# Patient Record
Sex: Female | Born: 1955 | Race: White | Hispanic: No | Smoking: Former smoker
Health system: Southern US, Community
[De-identification: ages and names within clinical notes are randomized; demographics above are authoritative.]

## PROBLEM LIST (undated history)

## (undated) DIAGNOSIS — J449 Chronic obstructive pulmonary disease, unspecified: Secondary | ICD-10-CM

## (undated) DIAGNOSIS — F419 Anxiety disorder, unspecified: Secondary | ICD-10-CM

## (undated) DIAGNOSIS — J111 Influenza due to unidentified influenza virus with other respiratory manifestations: Secondary | ICD-10-CM

## (undated) DIAGNOSIS — I10 Essential (primary) hypertension: Secondary | ICD-10-CM

## (undated) DIAGNOSIS — K219 Gastro-esophageal reflux disease without esophagitis: Secondary | ICD-10-CM

## (undated) DIAGNOSIS — F32A Depression, unspecified: Secondary | ICD-10-CM

## (undated) DIAGNOSIS — M1990 Unspecified osteoarthritis, unspecified site: Secondary | ICD-10-CM

## (undated) DIAGNOSIS — I639 Cerebral infarction, unspecified: Secondary | ICD-10-CM

## (undated) HISTORY — PX: FEMUR FRACTURE SURGERY: SHX633

## (undated) HISTORY — PX: BREAST BIOPSY: SHX20

## (undated) HISTORY — PX: ORIF TIBIA FRACTURE: SHX5416

## (undated) HISTORY — PX: CATARACT EXTRACTION W/ INTRAOCULAR LENS IMPLANT & ANTERIOR VITRECTOMY, BILATERAL: SHX1310

## (undated) HISTORY — PX: ANTERIOR CERVICAL DISCECTOMY: SHX1160

---

## 2002-04-07 ENCOUNTER — Encounter: Admission: RE | Admit: 2002-04-07 | Discharge: 2002-04-07 | Payer: Self-pay | Admitting: Neurosurgery

## 2002-04-07 ENCOUNTER — Encounter: Payer: Self-pay | Admitting: Neurosurgery

## 2002-04-20 ENCOUNTER — Encounter: Payer: Self-pay | Admitting: Neurosurgery

## 2002-04-23 ENCOUNTER — Ambulatory Visit (HOSPITAL_COMMUNITY): Admission: RE | Admit: 2002-04-23 | Discharge: 2002-04-24 | Payer: Self-pay | Admitting: Neurosurgery

## 2002-04-23 ENCOUNTER — Encounter: Payer: Self-pay | Admitting: Neurosurgery

## 2002-05-29 ENCOUNTER — Encounter: Payer: Self-pay | Admitting: Neurosurgery

## 2002-05-29 ENCOUNTER — Encounter: Admission: RE | Admit: 2002-05-29 | Discharge: 2002-05-29 | Payer: Self-pay | Admitting: Neurosurgery

## 2002-06-04 ENCOUNTER — Other Ambulatory Visit: Admission: RE | Admit: 2002-06-04 | Discharge: 2002-06-04 | Payer: Self-pay | Admitting: Dermatology

## 2002-07-01 ENCOUNTER — Encounter: Admission: RE | Admit: 2002-07-01 | Discharge: 2002-07-01 | Payer: Self-pay | Admitting: Neurosurgery

## 2002-07-01 ENCOUNTER — Encounter: Payer: Self-pay | Admitting: Neurosurgery

## 2002-08-18 ENCOUNTER — Ambulatory Visit (HOSPITAL_COMMUNITY): Admission: RE | Admit: 2002-08-18 | Discharge: 2002-08-18 | Payer: Self-pay | Admitting: Neurosurgery

## 2002-08-18 ENCOUNTER — Encounter: Payer: Self-pay | Admitting: Neurosurgery

## 2002-09-29 ENCOUNTER — Encounter: Admission: RE | Admit: 2002-09-29 | Discharge: 2002-09-29 | Payer: Self-pay | Admitting: Neurosurgery

## 2002-09-29 ENCOUNTER — Encounter: Payer: Self-pay | Admitting: Neurosurgery

## 2010-01-10 ENCOUNTER — Encounter: Admission: RE | Admit: 2010-01-10 | Discharge: 2010-01-10 | Payer: Self-pay | Admitting: Anesthesiology

## 2011-01-15 ENCOUNTER — Inpatient Hospital Stay: Admission: RE | Admit: 2011-01-15 | Payer: Self-pay | Source: Home / Self Care | Admitting: Neurosurgery

## 2011-05-11 NOTE — Op Note (Signed)
[place]  Patient:    Judith Hunt, Judith Hunt Visit Number: [id number] MRN: [id number]          Service Type: DSU Location: [id number] Attending Physician:  Mariam Dollar Dictated by:   Garlon Hatchet., M.D. Proc. 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  PROCEDURE:  Anterior cervical diskectomies and fusion at C5-6 and C6-7 with a 6 mm patellar wedge at C5-6 and a 7 mm patellar wedge at C6-7, a 37.5 mm Atlantis plate with six 13 mm variable-angled screws, with one rescue screw.  SURGEON:  Garlon Hatchet., M.D.  ASSISTANT:  Reinaldo Meeker, M.D.  ANESTHESIA:  General endotracheal.  CLINICAL NOTE:  The patient is a very pleasant 55 year old female who has had long-standing neck and left arm pain radiating down to her thumb and first two fingers.  This has been going on for several months and got progressively worse.  The patient failed conservative treatment of anti-inflammatories and physical therapy.  She had continued pain with weakness in her biceps and triceps at about 4+/5 with decreased sensation in what appeared to be a C6 and C7 distribution.  Her MRI showed spondylitic disease at C5-6 compressing the proximal aspect of the left C6 nerve root as well as at C6-7 compressing the left C7 nerve root.  She failed conservative treatment and was recommended diskectomies and fusion, extensively counseled of the risks and benefits of surgery, and agreed to proceed forward.  DESCRIPTION OF PROCEDURE:  The patient was brought into the OR, was induced under general anesthesia, positioned supine with the neck in slight extension and five pounds of Holter traction.  The right side of the neck was prepped and draped in the usual sterile fashion.  Preoperative x-ray  localized a needle over the C6 vertebral body.  A curvilinear incision made just off the anterior midline to the anterior border of the sternocleidomastoid with a 10 blade scalpel.  The superficial layer of the platysma was dissected out and divided longitudinally.  The avascular plane between the sternocleidomastoid and the omohyoid and strap muscles was developed down to prevertebral fascia. Prevertebral fascia was dissected away with Kitners.  The longus coli was reflected laterally, and the self-retaining retractor was placed. Intraoperative x-ray confirmed localization of a needle in the C5-6 disk space. Using the 11 blade scalpel, annulotomy was made on the left at C5-6 and C6-7. Pituitary rongeurs were used to remove the anterior margin of the annulus.  A high-speed drill was used to drill down the remainder of the annulus and nucleus pulposus down to the posterior annulus at both levels. Then the operating microscope was draped, brought into the field, and under microscopic illumination first at the C5-6 disk space, the remainder of the end plates were drilled down to the posterior osteophyte.  Then using a 1 and 2 mm Kerrison punch the osteophytes were underbitten and the posterior longitudinal ligament was identified and removed in piecemeal fashion.  There was a large amount of uncinate hypertrophy compressing the left C6 nerve root.  This was all removed in a piecemeal fashion with 1 and 2 mm Kerrison punch, exposing the proximal aspect of the C6 nerve root.  This  was radically decompressed out its foramen and felt with an angled nerve hook and noted to be widely decompressed.  The thecal sac was then decompressed down to the right side.  The proximal aspect of the right-sided C6 nerve root was decompressed.  Gelfoam was overlaid.  After the end plates had been prepared for arthrodesis, attention was taken to the C6-7 disk space.  Again the high-speed drill was used to drill  down the remainder of the nucleus pulposus, posterior annulus, and down the posterior longitudinal ligament.  There was a very large osteophyte coming off of the C6 vertebral body compressing the proximal aspect of the C7 nerve root.  This was all underbitten with a 1 and 2 mm Kerrison punch, and the proximal aspect of the C7 nerve root was widely decompressed and explored with an angled nerve hook, where it was noted to have no further compression.  The thecal sac was then decompressed and the proximal aspect of the right C7 nerve root was also decompressed.  After both C7 neural foramina were re-explored with angled nerve root and noted to have no further compression, the end plates were then prepared for arthrodesis. Using an interbody spreader, a 6 mm Tutagen patellar wedge was inserted in C5-6 after 2 mm were cut off the depth, and the 7 mm Tutagen patellar wedge was inserted at C6-7 in the routine fashion.  Then a 37 mm Atlantis plate was sized, selected, and inserted.  Six 13 mm variable-angled screws were drilled, tapped, and inserted.  The left-sided screw in the body of C5 was loose.  This was taken out, and a rescue screw was replaced.  All screws had excellent purchase.  Locking nuts were tightened down.  The wound was copiously irrigated and again meticulous hemostasis was maintained.  Postop x-ray confirmed good placement of the bone graft, screws, and hardware.  The platysma was reapproximated with 3-0 interrupted Vicryl, the skin was closed with running 4-0 subcuticular, and benzoin and Steri-Strips were applied.  The patient went to the recovery room in stable condition.  At the end of the case, all needle counts and sponge counts were correct. Dictated by:   Garlon Hatchet., M.D. Attending Physician:  Mariam Dollar DD:  04/23/02 TD:  04/25/02 Job: [id number]

## 2013-01-20 DIAGNOSIS — R079 Chest pain, unspecified: Secondary | ICD-10-CM

## 2015-08-02 DIAGNOSIS — G894 Chronic pain syndrome: Secondary | ICD-10-CM | POA: Diagnosis not present

## 2015-08-02 DIAGNOSIS — Z79891 Long term (current) use of opiate analgesic: Secondary | ICD-10-CM | POA: Diagnosis not present

## 2015-08-02 DIAGNOSIS — M79605 Pain in left leg: Secondary | ICD-10-CM | POA: Diagnosis not present

## 2015-08-02 DIAGNOSIS — M961 Postlaminectomy syndrome, not elsewhere classified: Secondary | ICD-10-CM | POA: Diagnosis not present

## 2015-08-30 DIAGNOSIS — G894 Chronic pain syndrome: Secondary | ICD-10-CM | POA: Diagnosis not present

## 2015-08-30 DIAGNOSIS — M961 Postlaminectomy syndrome, not elsewhere classified: Secondary | ICD-10-CM | POA: Diagnosis not present

## 2015-08-30 DIAGNOSIS — Z79891 Long term (current) use of opiate analgesic: Secondary | ICD-10-CM | POA: Diagnosis not present

## 2015-08-30 DIAGNOSIS — M79605 Pain in left leg: Secondary | ICD-10-CM | POA: Diagnosis not present

## 2015-09-27 DIAGNOSIS — Z79891 Long term (current) use of opiate analgesic: Secondary | ICD-10-CM | POA: Diagnosis not present

## 2015-09-27 DIAGNOSIS — M79605 Pain in left leg: Secondary | ICD-10-CM | POA: Diagnosis not present

## 2015-09-27 DIAGNOSIS — G894 Chronic pain syndrome: Secondary | ICD-10-CM | POA: Diagnosis not present

## 2015-09-27 DIAGNOSIS — M961 Postlaminectomy syndrome, not elsewhere classified: Secondary | ICD-10-CM | POA: Diagnosis not present

## 2015-10-12 DIAGNOSIS — M810 Age-related osteoporosis without current pathological fracture: Secondary | ICD-10-CM | POA: Diagnosis not present

## 2015-10-14 DIAGNOSIS — M4316 Spondylolisthesis, lumbar region: Secondary | ICD-10-CM | POA: Diagnosis not present

## 2015-10-14 DIAGNOSIS — Z9889 Other specified postprocedural states: Secondary | ICD-10-CM | POA: Diagnosis not present

## 2015-10-14 DIAGNOSIS — M5137 Other intervertebral disc degeneration, lumbosacral region: Secondary | ICD-10-CM | POA: Diagnosis not present

## 2015-10-14 DIAGNOSIS — Z0389 Encounter for observation for other suspected diseases and conditions ruled out: Secondary | ICD-10-CM | POA: Diagnosis not present

## 2015-10-14 DIAGNOSIS — M5136 Other intervertebral disc degeneration, lumbar region: Secondary | ICD-10-CM | POA: Diagnosis not present

## 2015-10-14 DIAGNOSIS — M810 Age-related osteoporosis without current pathological fracture: Secondary | ICD-10-CM | POA: Diagnosis not present

## 2015-10-14 DIAGNOSIS — Z23 Encounter for immunization: Secondary | ICD-10-CM | POA: Diagnosis not present

## 2015-10-26 DIAGNOSIS — Z79891 Long term (current) use of opiate analgesic: Secondary | ICD-10-CM | POA: Diagnosis not present

## 2015-10-26 DIAGNOSIS — M961 Postlaminectomy syndrome, not elsewhere classified: Secondary | ICD-10-CM | POA: Diagnosis not present

## 2015-10-26 DIAGNOSIS — G894 Chronic pain syndrome: Secondary | ICD-10-CM | POA: Diagnosis not present

## 2015-10-26 DIAGNOSIS — M79605 Pain in left leg: Secondary | ICD-10-CM | POA: Diagnosis not present

## 2015-11-02 DIAGNOSIS — M25532 Pain in left wrist: Secondary | ICD-10-CM | POA: Diagnosis not present

## 2015-11-07 DIAGNOSIS — F331 Major depressive disorder, recurrent, moderate: Secondary | ICD-10-CM | POA: Diagnosis not present

## 2015-11-07 DIAGNOSIS — M810 Age-related osteoporosis without current pathological fracture: Secondary | ICD-10-CM | POA: Diagnosis not present

## 2015-11-07 DIAGNOSIS — F411 Generalized anxiety disorder: Secondary | ICD-10-CM | POA: Diagnosis not present

## 2015-11-07 DIAGNOSIS — I10 Essential (primary) hypertension: Secondary | ICD-10-CM | POA: Diagnosis not present

## 2015-11-07 DIAGNOSIS — M170 Bilateral primary osteoarthritis of knee: Secondary | ICD-10-CM | POA: Diagnosis not present

## 2015-11-07 DIAGNOSIS — E782 Mixed hyperlipidemia: Secondary | ICD-10-CM | POA: Diagnosis not present

## 2015-11-07 DIAGNOSIS — J301 Allergic rhinitis due to pollen: Secondary | ICD-10-CM | POA: Diagnosis not present

## 2015-11-07 DIAGNOSIS — K219 Gastro-esophageal reflux disease without esophagitis: Secondary | ICD-10-CM | POA: Diagnosis not present

## 2015-11-23 DIAGNOSIS — M79605 Pain in left leg: Secondary | ICD-10-CM | POA: Diagnosis not present

## 2015-11-23 DIAGNOSIS — Z79891 Long term (current) use of opiate analgesic: Secondary | ICD-10-CM | POA: Diagnosis not present

## 2015-11-23 DIAGNOSIS — M961 Postlaminectomy syndrome, not elsewhere classified: Secondary | ICD-10-CM | POA: Diagnosis not present

## 2015-11-23 DIAGNOSIS — G894 Chronic pain syndrome: Secondary | ICD-10-CM | POA: Diagnosis not present

## 2016-01-30 DIAGNOSIS — Z79891 Long term (current) use of opiate analgesic: Secondary | ICD-10-CM | POA: Diagnosis not present

## 2016-01-30 DIAGNOSIS — M961 Postlaminectomy syndrome, not elsewhere classified: Secondary | ICD-10-CM | POA: Diagnosis not present

## 2016-01-30 DIAGNOSIS — M79605 Pain in left leg: Secondary | ICD-10-CM | POA: Diagnosis not present

## 2016-01-30 DIAGNOSIS — G894 Chronic pain syndrome: Secondary | ICD-10-CM | POA: Diagnosis not present

## 2016-02-01 DIAGNOSIS — Z8781 Personal history of (healed) traumatic fracture: Secondary | ICD-10-CM | POA: Diagnosis not present

## 2016-02-01 DIAGNOSIS — G8929 Other chronic pain: Secondary | ICD-10-CM | POA: Diagnosis not present

## 2016-02-01 DIAGNOSIS — M25562 Pain in left knee: Secondary | ICD-10-CM | POA: Diagnosis not present

## 2016-02-27 DIAGNOSIS — M79605 Pain in left leg: Secondary | ICD-10-CM | POA: Diagnosis not present

## 2016-02-27 DIAGNOSIS — G894 Chronic pain syndrome: Secondary | ICD-10-CM | POA: Diagnosis not present

## 2016-02-27 DIAGNOSIS — Z79891 Long term (current) use of opiate analgesic: Secondary | ICD-10-CM | POA: Diagnosis not present

## 2016-02-27 DIAGNOSIS — M961 Postlaminectomy syndrome, not elsewhere classified: Secondary | ICD-10-CM | POA: Diagnosis not present

## 2016-03-06 DIAGNOSIS — S82202K Unspecified fracture of shaft of left tibia, subsequent encounter for closed fracture with nonunion: Secondary | ICD-10-CM | POA: Diagnosis not present

## 2016-03-27 DIAGNOSIS — Z79891 Long term (current) use of opiate analgesic: Secondary | ICD-10-CM | POA: Diagnosis not present

## 2016-03-27 DIAGNOSIS — M961 Postlaminectomy syndrome, not elsewhere classified: Secondary | ICD-10-CM | POA: Diagnosis not present

## 2016-03-27 DIAGNOSIS — M79605 Pain in left leg: Secondary | ICD-10-CM | POA: Diagnosis not present

## 2016-03-27 DIAGNOSIS — G894 Chronic pain syndrome: Secondary | ICD-10-CM | POA: Diagnosis not present

## 2016-04-10 DIAGNOSIS — M810 Age-related osteoporosis without current pathological fracture: Secondary | ICD-10-CM | POA: Diagnosis not present

## 2016-04-18 DIAGNOSIS — Z9889 Other specified postprocedural states: Secondary | ICD-10-CM | POA: Diagnosis not present

## 2016-04-18 DIAGNOSIS — M79662 Pain in left lower leg: Secondary | ICD-10-CM | POA: Diagnosis not present

## 2016-04-18 DIAGNOSIS — M170 Bilateral primary osteoarthritis of knee: Secondary | ICD-10-CM | POA: Diagnosis not present

## 2016-04-25 DIAGNOSIS — K219 Gastro-esophageal reflux disease without esophagitis: Secondary | ICD-10-CM | POA: Diagnosis not present

## 2016-04-25 DIAGNOSIS — Z79891 Long term (current) use of opiate analgesic: Secondary | ICD-10-CM | POA: Diagnosis not present

## 2016-04-25 DIAGNOSIS — I10 Essential (primary) hypertension: Secondary | ICD-10-CM | POA: Diagnosis not present

## 2016-04-25 DIAGNOSIS — G894 Chronic pain syndrome: Secondary | ICD-10-CM | POA: Diagnosis not present

## 2016-04-25 DIAGNOSIS — M961 Postlaminectomy syndrome, not elsewhere classified: Secondary | ICD-10-CM | POA: Diagnosis not present

## 2016-04-25 DIAGNOSIS — E782 Mixed hyperlipidemia: Secondary | ICD-10-CM | POA: Diagnosis not present

## 2016-04-25 DIAGNOSIS — M79605 Pain in left leg: Secondary | ICD-10-CM | POA: Diagnosis not present

## 2016-05-02 DIAGNOSIS — F411 Generalized anxiety disorder: Secondary | ICD-10-CM | POA: Diagnosis not present

## 2016-05-02 DIAGNOSIS — F17210 Nicotine dependence, cigarettes, uncomplicated: Secondary | ICD-10-CM | POA: Diagnosis not present

## 2016-05-02 DIAGNOSIS — F331 Major depressive disorder, recurrent, moderate: Secondary | ICD-10-CM | POA: Diagnosis not present

## 2016-05-02 DIAGNOSIS — J301 Allergic rhinitis due to pollen: Secondary | ICD-10-CM | POA: Diagnosis not present

## 2016-05-02 DIAGNOSIS — E782 Mixed hyperlipidemia: Secondary | ICD-10-CM | POA: Diagnosis not present

## 2016-05-02 DIAGNOSIS — M170 Bilateral primary osteoarthritis of knee: Secondary | ICD-10-CM | POA: Diagnosis not present

## 2016-05-02 DIAGNOSIS — Z1389 Encounter for screening for other disorder: Secondary | ICD-10-CM | POA: Diagnosis not present

## 2016-05-02 DIAGNOSIS — I10 Essential (primary) hypertension: Secondary | ICD-10-CM | POA: Diagnosis not present

## 2016-05-04 DIAGNOSIS — F17200 Nicotine dependence, unspecified, uncomplicated: Secondary | ICD-10-CM | POA: Diagnosis not present

## 2016-05-04 DIAGNOSIS — J449 Chronic obstructive pulmonary disease, unspecified: Secondary | ICD-10-CM | POA: Diagnosis not present

## 2016-05-04 DIAGNOSIS — M25562 Pain in left knee: Secondary | ICD-10-CM | POA: Diagnosis not present

## 2016-05-04 DIAGNOSIS — T84117A Breakdown (mechanical) of internal fixation device of bone of left lower leg, initial encounter: Secondary | ICD-10-CM | POA: Diagnosis not present

## 2016-05-04 DIAGNOSIS — M179 Osteoarthritis of knee, unspecified: Secondary | ICD-10-CM | POA: Diagnosis not present

## 2016-05-04 DIAGNOSIS — I10 Essential (primary) hypertension: Secondary | ICD-10-CM | POA: Diagnosis not present

## 2016-05-04 DIAGNOSIS — S72415A Nondisplaced unspecified condyle fracture of lower end of left femur, initial encounter for closed fracture: Secondary | ICD-10-CM | POA: Diagnosis not present

## 2016-05-04 DIAGNOSIS — M8580 Other specified disorders of bone density and structure, unspecified site: Secondary | ICD-10-CM | POA: Diagnosis not present

## 2016-05-04 DIAGNOSIS — T84218A Breakdown (mechanical) of internal fixation device of other bones, initial encounter: Secondary | ICD-10-CM | POA: Diagnosis not present

## 2016-05-04 DIAGNOSIS — M79605 Pain in left leg: Secondary | ICD-10-CM | POA: Diagnosis not present

## 2016-05-04 DIAGNOSIS — Z79899 Other long term (current) drug therapy: Secondary | ICD-10-CM | POA: Diagnosis not present

## 2016-05-04 DIAGNOSIS — G500 Trigeminal neuralgia: Secondary | ICD-10-CM | POA: Diagnosis not present

## 2016-05-14 DIAGNOSIS — S72435A Nondisplaced fracture of medial condyle of left femur, initial encounter for closed fracture: Secondary | ICD-10-CM | POA: Diagnosis not present

## 2016-05-29 DIAGNOSIS — S72435A Nondisplaced fracture of medial condyle of left femur, initial encounter for closed fracture: Secondary | ICD-10-CM | POA: Diagnosis not present

## 2016-06-20 DIAGNOSIS — M79605 Pain in left leg: Secondary | ICD-10-CM | POA: Diagnosis not present

## 2016-06-20 DIAGNOSIS — M961 Postlaminectomy syndrome, not elsewhere classified: Secondary | ICD-10-CM | POA: Diagnosis not present

## 2016-06-20 DIAGNOSIS — G894 Chronic pain syndrome: Secondary | ICD-10-CM | POA: Diagnosis not present

## 2016-06-20 DIAGNOSIS — Z79891 Long term (current) use of opiate analgesic: Secondary | ICD-10-CM | POA: Diagnosis not present

## 2016-06-27 DIAGNOSIS — S72435A Nondisplaced fracture of medial condyle of left femur, initial encounter for closed fracture: Secondary | ICD-10-CM | POA: Diagnosis not present

## 2016-06-30 DIAGNOSIS — J029 Acute pharyngitis, unspecified: Secondary | ICD-10-CM | POA: Diagnosis not present

## 2016-06-30 DIAGNOSIS — K219 Gastro-esophageal reflux disease without esophagitis: Secondary | ICD-10-CM | POA: Diagnosis not present

## 2016-06-30 DIAGNOSIS — J309 Allergic rhinitis, unspecified: Secondary | ICD-10-CM | POA: Diagnosis not present

## 2016-07-05 DIAGNOSIS — M79651 Pain in right thigh: Secondary | ICD-10-CM | POA: Diagnosis not present

## 2016-07-05 DIAGNOSIS — T8189XA Other complications of procedures, not elsewhere classified, initial encounter: Secondary | ICD-10-CM | POA: Diagnosis not present

## 2016-07-05 DIAGNOSIS — M25561 Pain in right knee: Secondary | ICD-10-CM | POA: Diagnosis not present

## 2016-07-05 DIAGNOSIS — J449 Chronic obstructive pulmonary disease, unspecified: Secondary | ICD-10-CM | POA: Diagnosis not present

## 2016-07-05 DIAGNOSIS — I10 Essential (primary) hypertension: Secondary | ICD-10-CM | POA: Diagnosis not present

## 2016-07-05 DIAGNOSIS — Z79899 Other long term (current) drug therapy: Secondary | ICD-10-CM | POA: Diagnosis not present

## 2016-07-05 DIAGNOSIS — M25562 Pain in left knee: Secondary | ICD-10-CM | POA: Diagnosis not present

## 2016-07-05 DIAGNOSIS — F17200 Nicotine dependence, unspecified, uncomplicated: Secondary | ICD-10-CM | POA: Diagnosis not present

## 2016-07-05 DIAGNOSIS — M25461 Effusion, right knee: Secondary | ICD-10-CM | POA: Diagnosis not present

## 2016-07-09 DIAGNOSIS — S82034A Nondisplaced transverse fracture of right patella, initial encounter for closed fracture: Secondary | ICD-10-CM | POA: Diagnosis not present

## 2016-07-09 DIAGNOSIS — S72435A Nondisplaced fracture of medial condyle of left femur, initial encounter for closed fracture: Secondary | ICD-10-CM | POA: Diagnosis not present

## 2016-07-23 DIAGNOSIS — S82034A Nondisplaced transverse fracture of right patella, initial encounter for closed fracture: Secondary | ICD-10-CM | POA: Diagnosis not present

## 2016-08-02 DIAGNOSIS — S82034A Nondisplaced transverse fracture of right patella, initial encounter for closed fracture: Secondary | ICD-10-CM | POA: Diagnosis not present

## 2016-08-02 DIAGNOSIS — M25562 Pain in left knee: Secondary | ICD-10-CM | POA: Diagnosis not present

## 2016-08-15 DIAGNOSIS — M79605 Pain in left leg: Secondary | ICD-10-CM | POA: Diagnosis not present

## 2016-08-15 DIAGNOSIS — M961 Postlaminectomy syndrome, not elsewhere classified: Secondary | ICD-10-CM | POA: Diagnosis not present

## 2016-08-15 DIAGNOSIS — G894 Chronic pain syndrome: Secondary | ICD-10-CM | POA: Diagnosis not present

## 2016-08-15 DIAGNOSIS — Z79891 Long term (current) use of opiate analgesic: Secondary | ICD-10-CM | POA: Diagnosis not present

## 2016-08-30 DIAGNOSIS — E782 Mixed hyperlipidemia: Secondary | ICD-10-CM | POA: Diagnosis not present

## 2016-08-30 DIAGNOSIS — K210 Gastro-esophageal reflux disease with esophagitis: Secondary | ICD-10-CM | POA: Diagnosis not present

## 2016-08-30 DIAGNOSIS — I10 Essential (primary) hypertension: Secondary | ICD-10-CM | POA: Diagnosis not present

## 2016-08-30 DIAGNOSIS — F411 Generalized anxiety disorder: Secondary | ICD-10-CM | POA: Diagnosis not present

## 2016-09-03 DIAGNOSIS — M7052 Other bursitis of knee, left knee: Secondary | ICD-10-CM | POA: Diagnosis not present

## 2016-09-03 DIAGNOSIS — M25562 Pain in left knee: Secondary | ICD-10-CM | POA: Diagnosis not present

## 2016-09-03 DIAGNOSIS — T8484XA Pain due to internal orthopedic prosthetic devices, implants and grafts, initial encounter: Secondary | ICD-10-CM | POA: Diagnosis not present

## 2016-09-05 DIAGNOSIS — E782 Mixed hyperlipidemia: Secondary | ICD-10-CM | POA: Diagnosis not present

## 2016-09-05 DIAGNOSIS — I10 Essential (primary) hypertension: Secondary | ICD-10-CM | POA: Diagnosis not present

## 2016-09-05 DIAGNOSIS — Z6827 Body mass index (BMI) 27.0-27.9, adult: Secondary | ICD-10-CM | POA: Diagnosis not present

## 2016-09-05 DIAGNOSIS — Z23 Encounter for immunization: Secondary | ICD-10-CM | POA: Diagnosis not present

## 2016-09-05 DIAGNOSIS — F331 Major depressive disorder, recurrent, moderate: Secondary | ICD-10-CM | POA: Diagnosis not present

## 2016-09-05 DIAGNOSIS — F17210 Nicotine dependence, cigarettes, uncomplicated: Secondary | ICD-10-CM | POA: Diagnosis not present

## 2016-09-05 DIAGNOSIS — J301 Allergic rhinitis due to pollen: Secondary | ICD-10-CM | POA: Diagnosis not present

## 2016-09-05 DIAGNOSIS — F411 Generalized anxiety disorder: Secondary | ICD-10-CM | POA: Diagnosis not present

## 2016-09-12 DIAGNOSIS — M961 Postlaminectomy syndrome, not elsewhere classified: Secondary | ICD-10-CM | POA: Diagnosis not present

## 2016-09-12 DIAGNOSIS — G894 Chronic pain syndrome: Secondary | ICD-10-CM | POA: Diagnosis not present

## 2016-09-12 DIAGNOSIS — M79605 Pain in left leg: Secondary | ICD-10-CM | POA: Diagnosis not present

## 2016-09-12 DIAGNOSIS — Z79891 Long term (current) use of opiate analgesic: Secondary | ICD-10-CM | POA: Diagnosis not present

## 2016-10-16 DIAGNOSIS — Z79891 Long term (current) use of opiate analgesic: Secondary | ICD-10-CM | POA: Diagnosis not present

## 2016-10-16 DIAGNOSIS — G894 Chronic pain syndrome: Secondary | ICD-10-CM | POA: Diagnosis not present

## 2016-10-16 DIAGNOSIS — M961 Postlaminectomy syndrome, not elsewhere classified: Secondary | ICD-10-CM | POA: Diagnosis not present

## 2016-10-16 DIAGNOSIS — M79605 Pain in left leg: Secondary | ICD-10-CM | POA: Diagnosis not present

## 2016-10-17 DIAGNOSIS — T8484XA Pain due to internal orthopedic prosthetic devices, implants and grafts, initial encounter: Secondary | ICD-10-CM | POA: Diagnosis not present

## 2016-10-17 DIAGNOSIS — M25562 Pain in left knee: Secondary | ICD-10-CM | POA: Diagnosis not present

## 2016-10-26 DIAGNOSIS — K219 Gastro-esophageal reflux disease without esophagitis: Secondary | ICD-10-CM | POA: Diagnosis not present

## 2016-10-26 DIAGNOSIS — Z880 Allergy status to penicillin: Secondary | ICD-10-CM | POA: Diagnosis not present

## 2016-10-26 DIAGNOSIS — X58XXXD Exposure to other specified factors, subsequent encounter: Secondary | ICD-10-CM | POA: Diagnosis not present

## 2016-10-26 DIAGNOSIS — Z888 Allergy status to other drugs, medicaments and biological substances status: Secondary | ICD-10-CM | POA: Diagnosis not present

## 2016-10-26 DIAGNOSIS — T8484XA Pain due to internal orthopedic prosthetic devices, implants and grafts, initial encounter: Secondary | ICD-10-CM | POA: Diagnosis not present

## 2016-10-26 DIAGNOSIS — I10 Essential (primary) hypertension: Secondary | ICD-10-CM | POA: Diagnosis not present

## 2016-10-26 DIAGNOSIS — J449 Chronic obstructive pulmonary disease, unspecified: Secondary | ICD-10-CM | POA: Diagnosis not present

## 2016-10-26 DIAGNOSIS — G8918 Other acute postprocedural pain: Secondary | ICD-10-CM | POA: Diagnosis not present

## 2016-10-26 DIAGNOSIS — Z9049 Acquired absence of other specified parts of digestive tract: Secondary | ICD-10-CM | POA: Diagnosis not present

## 2016-10-26 DIAGNOSIS — Y838 Other surgical procedures as the cause of abnormal reaction of the patient, or of later complication, without mention of misadventure at the time of the procedure: Secondary | ICD-10-CM | POA: Diagnosis not present

## 2016-10-26 DIAGNOSIS — F419 Anxiety disorder, unspecified: Secondary | ICD-10-CM | POA: Diagnosis not present

## 2016-10-26 DIAGNOSIS — Z8249 Family history of ischemic heart disease and other diseases of the circulatory system: Secondary | ICD-10-CM | POA: Diagnosis not present

## 2016-10-26 DIAGNOSIS — Z8261 Family history of arthritis: Secondary | ICD-10-CM | POA: Diagnosis not present

## 2016-10-26 DIAGNOSIS — Z79899 Other long term (current) drug therapy: Secondary | ICD-10-CM | POA: Diagnosis not present

## 2016-10-26 DIAGNOSIS — M79662 Pain in left lower leg: Secondary | ICD-10-CM | POA: Diagnosis not present

## 2016-10-26 DIAGNOSIS — F329 Major depressive disorder, single episode, unspecified: Secondary | ICD-10-CM | POA: Diagnosis not present

## 2016-10-26 DIAGNOSIS — S82202P Unspecified fracture of shaft of left tibia, subsequent encounter for closed fracture with malunion: Secondary | ICD-10-CM | POA: Diagnosis not present

## 2016-10-26 DIAGNOSIS — F17210 Nicotine dependence, cigarettes, uncomplicated: Secondary | ICD-10-CM | POA: Diagnosis not present

## 2016-10-26 DIAGNOSIS — Z91040 Latex allergy status: Secondary | ICD-10-CM | POA: Diagnosis not present

## 2016-11-06 DIAGNOSIS — M25562 Pain in left knee: Secondary | ICD-10-CM | POA: Diagnosis not present

## 2016-11-14 DIAGNOSIS — Z79891 Long term (current) use of opiate analgesic: Secondary | ICD-10-CM | POA: Diagnosis not present

## 2016-11-14 DIAGNOSIS — M79605 Pain in left leg: Secondary | ICD-10-CM | POA: Diagnosis not present

## 2016-11-14 DIAGNOSIS — G894 Chronic pain syndrome: Secondary | ICD-10-CM | POA: Diagnosis not present

## 2016-11-14 DIAGNOSIS — M961 Postlaminectomy syndrome, not elsewhere classified: Secondary | ICD-10-CM | POA: Diagnosis not present

## 2017-01-04 DIAGNOSIS — F411 Generalized anxiety disorder: Secondary | ICD-10-CM | POA: Diagnosis not present

## 2017-01-04 DIAGNOSIS — K210 Gastro-esophageal reflux disease with esophagitis: Secondary | ICD-10-CM | POA: Diagnosis not present

## 2017-01-04 DIAGNOSIS — J449 Chronic obstructive pulmonary disease, unspecified: Secondary | ICD-10-CM | POA: Diagnosis not present

## 2017-01-04 DIAGNOSIS — F331 Major depressive disorder, recurrent, moderate: Secondary | ICD-10-CM | POA: Diagnosis not present

## 2017-01-04 DIAGNOSIS — F17210 Nicotine dependence, cigarettes, uncomplicated: Secondary | ICD-10-CM | POA: Diagnosis not present

## 2017-01-04 DIAGNOSIS — Z9189 Other specified personal risk factors, not elsewhere classified: Secondary | ICD-10-CM | POA: Diagnosis not present

## 2017-01-04 DIAGNOSIS — I10 Essential (primary) hypertension: Secondary | ICD-10-CM | POA: Diagnosis not present

## 2017-01-04 DIAGNOSIS — E782 Mixed hyperlipidemia: Secondary | ICD-10-CM | POA: Diagnosis not present

## 2017-01-08 DIAGNOSIS — J301 Allergic rhinitis due to pollen: Secondary | ICD-10-CM | POA: Diagnosis not present

## 2017-01-08 DIAGNOSIS — I10 Essential (primary) hypertension: Secondary | ICD-10-CM | POA: Diagnosis not present

## 2017-01-08 DIAGNOSIS — E782 Mixed hyperlipidemia: Secondary | ICD-10-CM | POA: Diagnosis not present

## 2017-01-15 DIAGNOSIS — Z1231 Encounter for screening mammogram for malignant neoplasm of breast: Secondary | ICD-10-CM | POA: Diagnosis not present

## 2017-01-31 DIAGNOSIS — G894 Chronic pain syndrome: Secondary | ICD-10-CM | POA: Diagnosis not present

## 2017-01-31 DIAGNOSIS — Z79891 Long term (current) use of opiate analgesic: Secondary | ICD-10-CM | POA: Diagnosis not present

## 2017-01-31 DIAGNOSIS — M47816 Spondylosis without myelopathy or radiculopathy, lumbar region: Secondary | ICD-10-CM | POA: Diagnosis not present

## 2017-01-31 DIAGNOSIS — M79605 Pain in left leg: Secondary | ICD-10-CM | POA: Diagnosis not present

## 2017-01-31 DIAGNOSIS — M961 Postlaminectomy syndrome, not elsewhere classified: Secondary | ICD-10-CM | POA: Diagnosis not present

## 2017-02-05 DIAGNOSIS — Z1212 Encounter for screening for malignant neoplasm of rectum: Secondary | ICD-10-CM | POA: Diagnosis not present

## 2017-02-05 DIAGNOSIS — Z1211 Encounter for screening for malignant neoplasm of colon: Secondary | ICD-10-CM | POA: Diagnosis not present

## 2017-02-28 DIAGNOSIS — Z79891 Long term (current) use of opiate analgesic: Secondary | ICD-10-CM | POA: Diagnosis not present

## 2017-02-28 DIAGNOSIS — M961 Postlaminectomy syndrome, not elsewhere classified: Secondary | ICD-10-CM | POA: Diagnosis not present

## 2017-02-28 DIAGNOSIS — G894 Chronic pain syndrome: Secondary | ICD-10-CM | POA: Diagnosis not present

## 2017-02-28 DIAGNOSIS — M79605 Pain in left leg: Secondary | ICD-10-CM | POA: Diagnosis not present

## 2017-03-28 DIAGNOSIS — Z79891 Long term (current) use of opiate analgesic: Secondary | ICD-10-CM | POA: Diagnosis not present

## 2017-03-28 DIAGNOSIS — M961 Postlaminectomy syndrome, not elsewhere classified: Secondary | ICD-10-CM | POA: Diagnosis not present

## 2017-03-28 DIAGNOSIS — G894 Chronic pain syndrome: Secondary | ICD-10-CM | POA: Diagnosis not present

## 2017-03-28 DIAGNOSIS — M79605 Pain in left leg: Secondary | ICD-10-CM | POA: Diagnosis not present

## 2017-04-26 DIAGNOSIS — G894 Chronic pain syndrome: Secondary | ICD-10-CM | POA: Diagnosis not present

## 2017-04-26 DIAGNOSIS — M79605 Pain in left leg: Secondary | ICD-10-CM | POA: Diagnosis not present

## 2017-04-26 DIAGNOSIS — M961 Postlaminectomy syndrome, not elsewhere classified: Secondary | ICD-10-CM | POA: Diagnosis not present

## 2017-04-26 DIAGNOSIS — Z79891 Long term (current) use of opiate analgesic: Secondary | ICD-10-CM | POA: Diagnosis not present

## 2017-05-08 DIAGNOSIS — I10 Essential (primary) hypertension: Secondary | ICD-10-CM | POA: Diagnosis not present

## 2017-05-08 DIAGNOSIS — E782 Mixed hyperlipidemia: Secondary | ICD-10-CM | POA: Diagnosis not present

## 2017-05-08 DIAGNOSIS — J449 Chronic obstructive pulmonary disease, unspecified: Secondary | ICD-10-CM | POA: Diagnosis not present

## 2017-05-10 DIAGNOSIS — E782 Mixed hyperlipidemia: Secondary | ICD-10-CM | POA: Diagnosis not present

## 2017-05-10 DIAGNOSIS — M170 Bilateral primary osteoarthritis of knee: Secondary | ICD-10-CM | POA: Diagnosis not present

## 2017-05-10 DIAGNOSIS — Z23 Encounter for immunization: Secondary | ICD-10-CM | POA: Diagnosis not present

## 2017-05-10 DIAGNOSIS — Z1389 Encounter for screening for other disorder: Secondary | ICD-10-CM | POA: Diagnosis not present

## 2017-05-10 DIAGNOSIS — M810 Age-related osteoporosis without current pathological fracture: Secondary | ICD-10-CM | POA: Diagnosis not present

## 2017-05-28 DIAGNOSIS — Z961 Presence of intraocular lens: Secondary | ICD-10-CM | POA: Diagnosis not present

## 2017-05-28 DIAGNOSIS — H524 Presbyopia: Secondary | ICD-10-CM | POA: Diagnosis not present

## 2017-06-07 DIAGNOSIS — M79605 Pain in left leg: Secondary | ICD-10-CM | POA: Diagnosis not present

## 2017-06-07 DIAGNOSIS — M961 Postlaminectomy syndrome, not elsewhere classified: Secondary | ICD-10-CM | POA: Diagnosis not present

## 2017-06-07 DIAGNOSIS — Z79891 Long term (current) use of opiate analgesic: Secondary | ICD-10-CM | POA: Diagnosis not present

## 2017-06-07 DIAGNOSIS — G894 Chronic pain syndrome: Secondary | ICD-10-CM | POA: Diagnosis not present

## 2017-07-16 ENCOUNTER — Other Ambulatory Visit: Payer: Self-pay | Admitting: Physical Medicine and Rehabilitation

## 2017-07-16 DIAGNOSIS — M47816 Spondylosis without myelopathy or radiculopathy, lumbar region: Secondary | ICD-10-CM

## 2017-07-28 ENCOUNTER — Ambulatory Visit
Admission: RE | Admit: 2017-07-28 | Discharge: 2017-07-28 | Disposition: A | Discharge status: Home / Self Care | Payer: Self-pay | Source: Ambulatory Visit | Attending: Physical Medicine and Rehabilitation | Admitting: Physical Medicine and Rehabilitation

## 2017-07-28 DIAGNOSIS — M48061 Spinal stenosis, lumbar region without neurogenic claudication: Secondary | ICD-10-CM | POA: Diagnosis not present

## 2017-07-28 DIAGNOSIS — M5126 Other intervertebral disc displacement, lumbar region: Secondary | ICD-10-CM | POA: Diagnosis not present

## 2017-07-28 DIAGNOSIS — M47816 Spondylosis without myelopathy or radiculopathy, lumbar region: Secondary | ICD-10-CM

## 2017-08-05 DIAGNOSIS — M79605 Pain in left leg: Secondary | ICD-10-CM | POA: Diagnosis not present

## 2017-08-05 DIAGNOSIS — M961 Postlaminectomy syndrome, not elsewhere classified: Secondary | ICD-10-CM | POA: Diagnosis not present

## 2017-08-05 DIAGNOSIS — G894 Chronic pain syndrome: Secondary | ICD-10-CM | POA: Diagnosis not present

## 2017-08-05 DIAGNOSIS — Z79891 Long term (current) use of opiate analgesic: Secondary | ICD-10-CM | POA: Diagnosis not present

## 2017-08-22 DIAGNOSIS — M170 Bilateral primary osteoarthritis of knee: Secondary | ICD-10-CM | POA: Diagnosis not present

## 2017-08-22 DIAGNOSIS — M839 Adult osteomalacia, unspecified: Secondary | ICD-10-CM | POA: Diagnosis not present

## 2017-08-22 DIAGNOSIS — S82232P Displaced oblique fracture of shaft of left tibia, subsequent encounter for closed fracture with malunion: Secondary | ICD-10-CM | POA: Diagnosis not present

## 2017-08-22 DIAGNOSIS — M25562 Pain in left knee: Secondary | ICD-10-CM | POA: Diagnosis not present

## 2017-08-22 DIAGNOSIS — G8929 Other chronic pain: Secondary | ICD-10-CM | POA: Diagnosis not present

## 2017-08-22 DIAGNOSIS — M25561 Pain in right knee: Secondary | ICD-10-CM | POA: Diagnosis not present

## 2017-08-30 DIAGNOSIS — I10 Essential (primary) hypertension: Secondary | ICD-10-CM | POA: Diagnosis not present

## 2017-08-30 DIAGNOSIS — J449 Chronic obstructive pulmonary disease, unspecified: Secondary | ICD-10-CM | POA: Diagnosis not present

## 2017-08-30 DIAGNOSIS — K210 Gastro-esophageal reflux disease with esophagitis: Secondary | ICD-10-CM | POA: Diagnosis not present

## 2017-08-30 DIAGNOSIS — E782 Mixed hyperlipidemia: Secondary | ICD-10-CM | POA: Diagnosis not present

## 2017-09-04 DIAGNOSIS — M545 Low back pain: Secondary | ICD-10-CM | POA: Diagnosis not present

## 2017-09-04 DIAGNOSIS — M47816 Spondylosis without myelopathy or radiculopathy, lumbar region: Secondary | ICD-10-CM | POA: Diagnosis not present

## 2017-09-04 DIAGNOSIS — M48061 Spinal stenosis, lumbar region without neurogenic claudication: Secondary | ICD-10-CM | POA: Diagnosis not present

## 2017-09-10 DIAGNOSIS — M545 Low back pain: Secondary | ICD-10-CM | POA: Diagnosis not present

## 2017-09-10 DIAGNOSIS — M961 Postlaminectomy syndrome, not elsewhere classified: Secondary | ICD-10-CM | POA: Diagnosis not present

## 2017-09-10 DIAGNOSIS — M48061 Spinal stenosis, lumbar region without neurogenic claudication: Secondary | ICD-10-CM | POA: Diagnosis not present

## 2017-09-10 DIAGNOSIS — Z79891 Long term (current) use of opiate analgesic: Secondary | ICD-10-CM | POA: Diagnosis not present

## 2017-09-10 DIAGNOSIS — M47816 Spondylosis without myelopathy or radiculopathy, lumbar region: Secondary | ICD-10-CM | POA: Diagnosis not present

## 2017-09-10 DIAGNOSIS — G894 Chronic pain syndrome: Secondary | ICD-10-CM | POA: Diagnosis not present

## 2017-09-24 DIAGNOSIS — L03116 Cellulitis of left lower limb: Secondary | ICD-10-CM | POA: Diagnosis not present

## 2017-09-24 DIAGNOSIS — R2681 Unsteadiness on feet: Secondary | ICD-10-CM | POA: Diagnosis not present

## 2017-09-30 DIAGNOSIS — R2681 Unsteadiness on feet: Secondary | ICD-10-CM | POA: Diagnosis not present

## 2017-09-30 DIAGNOSIS — R55 Syncope and collapse: Secondary | ICD-10-CM | POA: Diagnosis not present

## 2017-09-30 DIAGNOSIS — R9082 White matter disease, unspecified: Secondary | ICD-10-CM | POA: Diagnosis not present

## 2017-10-09 DIAGNOSIS — M48061 Spinal stenosis, lumbar region without neurogenic claudication: Secondary | ICD-10-CM | POA: Diagnosis not present

## 2017-10-09 DIAGNOSIS — M545 Low back pain: Secondary | ICD-10-CM | POA: Diagnosis not present

## 2017-10-09 DIAGNOSIS — G894 Chronic pain syndrome: Secondary | ICD-10-CM | POA: Diagnosis not present

## 2017-10-09 DIAGNOSIS — M47816 Spondylosis without myelopathy or radiculopathy, lumbar region: Secondary | ICD-10-CM | POA: Diagnosis not present

## 2017-10-23 DIAGNOSIS — Z23 Encounter for immunization: Secondary | ICD-10-CM | POA: Diagnosis not present

## 2017-10-23 DIAGNOSIS — Z0001 Encounter for general adult medical examination with abnormal findings: Secondary | ICD-10-CM | POA: Diagnosis not present

## 2017-10-23 DIAGNOSIS — I10 Essential (primary) hypertension: Secondary | ICD-10-CM | POA: Diagnosis not present

## 2017-10-23 DIAGNOSIS — E782 Mixed hyperlipidemia: Secondary | ICD-10-CM | POA: Diagnosis not present

## 2017-11-06 DIAGNOSIS — G894 Chronic pain syndrome: Secondary | ICD-10-CM | POA: Diagnosis not present

## 2017-11-06 DIAGNOSIS — G500 Trigeminal neuralgia: Secondary | ICD-10-CM | POA: Diagnosis not present

## 2017-11-06 DIAGNOSIS — M545 Low back pain: Secondary | ICD-10-CM | POA: Diagnosis not present

## 2017-11-06 DIAGNOSIS — M47816 Spondylosis without myelopathy or radiculopathy, lumbar region: Secondary | ICD-10-CM | POA: Diagnosis not present

## 2017-12-09 DIAGNOSIS — J0101 Acute recurrent maxillary sinusitis: Secondary | ICD-10-CM | POA: Diagnosis not present

## 2017-12-09 DIAGNOSIS — R319 Hematuria, unspecified: Secondary | ICD-10-CM | POA: Diagnosis not present

## 2017-12-09 DIAGNOSIS — R300 Dysuria: Secondary | ICD-10-CM | POA: Diagnosis not present

## 2017-12-10 DIAGNOSIS — R300 Dysuria: Secondary | ICD-10-CM | POA: Diagnosis not present

## 2017-12-10 DIAGNOSIS — R350 Frequency of micturition: Secondary | ICD-10-CM | POA: Diagnosis not present

## 2018-01-06 DIAGNOSIS — G500 Trigeminal neuralgia: Secondary | ICD-10-CM | POA: Diagnosis not present

## 2018-01-06 DIAGNOSIS — M47816 Spondylosis without myelopathy or radiculopathy, lumbar region: Secondary | ICD-10-CM | POA: Diagnosis not present

## 2018-01-06 DIAGNOSIS — G894 Chronic pain syndrome: Secondary | ICD-10-CM | POA: Diagnosis not present

## 2018-01-06 DIAGNOSIS — M545 Low back pain: Secondary | ICD-10-CM | POA: Diagnosis not present

## 2018-02-05 DIAGNOSIS — M1990 Unspecified osteoarthritis, unspecified site: Secondary | ICD-10-CM | POA: Diagnosis not present

## 2018-02-21 DIAGNOSIS — M170 Bilateral primary osteoarthritis of knee: Secondary | ICD-10-CM | POA: Diagnosis not present

## 2018-02-21 DIAGNOSIS — K219 Gastro-esophageal reflux disease without esophagitis: Secondary | ICD-10-CM | POA: Diagnosis not present

## 2018-02-21 DIAGNOSIS — I10 Essential (primary) hypertension: Secondary | ICD-10-CM | POA: Diagnosis not present

## 2018-02-21 DIAGNOSIS — M810 Age-related osteoporosis without current pathological fracture: Secondary | ICD-10-CM | POA: Diagnosis not present

## 2018-02-21 DIAGNOSIS — E782 Mixed hyperlipidemia: Secondary | ICD-10-CM | POA: Diagnosis not present

## 2018-03-04 DIAGNOSIS — M47816 Spondylosis without myelopathy or radiculopathy, lumbar region: Secondary | ICD-10-CM | POA: Diagnosis not present

## 2018-03-04 DIAGNOSIS — Z79891 Long term (current) use of opiate analgesic: Secondary | ICD-10-CM | POA: Diagnosis not present

## 2018-03-04 DIAGNOSIS — M545 Low back pain: Secondary | ICD-10-CM | POA: Diagnosis not present

## 2018-03-04 DIAGNOSIS — G894 Chronic pain syndrome: Secondary | ICD-10-CM | POA: Diagnosis not present

## 2018-03-04 DIAGNOSIS — G500 Trigeminal neuralgia: Secondary | ICD-10-CM | POA: Diagnosis not present

## 2018-05-05 DIAGNOSIS — M48062 Spinal stenosis, lumbar region with neurogenic claudication: Secondary | ICD-10-CM | POA: Diagnosis not present

## 2018-05-05 DIAGNOSIS — M47816 Spondylosis without myelopathy or radiculopathy, lumbar region: Secondary | ICD-10-CM | POA: Diagnosis not present

## 2018-05-05 DIAGNOSIS — G894 Chronic pain syndrome: Secondary | ICD-10-CM | POA: Diagnosis not present

## 2018-05-05 DIAGNOSIS — M545 Low back pain: Secondary | ICD-10-CM | POA: Diagnosis not present

## 2018-05-10 DIAGNOSIS — L570 Actinic keratosis: Secondary | ICD-10-CM | POA: Diagnosis not present

## 2018-05-22 DIAGNOSIS — M25562 Pain in left knee: Secondary | ICD-10-CM | POA: Diagnosis not present

## 2018-06-23 DIAGNOSIS — Z9189 Other specified personal risk factors, not elsewhere classified: Secondary | ICD-10-CM | POA: Diagnosis not present

## 2018-06-23 DIAGNOSIS — K210 Gastro-esophageal reflux disease with esophagitis: Secondary | ICD-10-CM | POA: Diagnosis not present

## 2018-06-23 DIAGNOSIS — I10 Essential (primary) hypertension: Secondary | ICD-10-CM | POA: Diagnosis not present

## 2018-06-23 DIAGNOSIS — E782 Mixed hyperlipidemia: Secondary | ICD-10-CM | POA: Diagnosis not present

## 2018-06-24 DIAGNOSIS — K219 Gastro-esophageal reflux disease without esophagitis: Secondary | ICD-10-CM | POA: Diagnosis not present

## 2018-06-24 DIAGNOSIS — E782 Mixed hyperlipidemia: Secondary | ICD-10-CM | POA: Diagnosis not present

## 2018-06-24 DIAGNOSIS — I10 Essential (primary) hypertension: Secondary | ICD-10-CM | POA: Diagnosis not present

## 2018-06-24 DIAGNOSIS — M170 Bilateral primary osteoarthritis of knee: Secondary | ICD-10-CM | POA: Diagnosis not present

## 2018-06-24 DIAGNOSIS — M810 Age-related osteoporosis without current pathological fracture: Secondary | ICD-10-CM | POA: Diagnosis not present

## 2018-07-01 DIAGNOSIS — G894 Chronic pain syndrome: Secondary | ICD-10-CM | POA: Diagnosis not present

## 2018-07-01 DIAGNOSIS — M545 Low back pain: Secondary | ICD-10-CM | POA: Diagnosis not present

## 2018-07-01 DIAGNOSIS — M47816 Spondylosis without myelopathy or radiculopathy, lumbar region: Secondary | ICD-10-CM | POA: Diagnosis not present

## 2018-07-01 DIAGNOSIS — M961 Postlaminectomy syndrome, not elsewhere classified: Secondary | ICD-10-CM | POA: Diagnosis not present

## 2018-08-20 DIAGNOSIS — J301 Allergic rhinitis due to pollen: Secondary | ICD-10-CM | POA: Diagnosis not present

## 2018-08-20 DIAGNOSIS — K219 Gastro-esophageal reflux disease without esophagitis: Secondary | ICD-10-CM | POA: Diagnosis not present

## 2018-08-20 DIAGNOSIS — I10 Essential (primary) hypertension: Secondary | ICD-10-CM | POA: Diagnosis not present

## 2018-08-27 DIAGNOSIS — M545 Low back pain: Secondary | ICD-10-CM | POA: Diagnosis not present

## 2018-08-27 DIAGNOSIS — M48062 Spinal stenosis, lumbar region with neurogenic claudication: Secondary | ICD-10-CM | POA: Diagnosis not present

## 2018-08-27 DIAGNOSIS — G894 Chronic pain syndrome: Secondary | ICD-10-CM | POA: Diagnosis not present

## 2018-08-27 DIAGNOSIS — M47816 Spondylosis without myelopathy or radiculopathy, lumbar region: Secondary | ICD-10-CM | POA: Diagnosis not present

## 2018-09-22 ENCOUNTER — Other Ambulatory Visit: Payer: Self-pay

## 2018-09-22 NOTE — Patient Outreach (Signed)
[facility]) Care Management  09/22/2018  ALISA STJAMES 1956/03/30 [id number]   Medication Adherence call to Mrs. Cephus Slater patient did not answer and voice message is full patient is due on Lisinopril/ Hctz 20/12.5 mg. Mrs. Flynt is showing past due under [facility] Ins.   Lillia Abed CPhT Pharmacy Technician [facility] Care Management Direct Dial [phone]  Fax [phone] [email or web address]

## 2018-10-22 DIAGNOSIS — I10 Essential (primary) hypertension: Secondary | ICD-10-CM | POA: Diagnosis not present

## 2018-10-22 DIAGNOSIS — K219 Gastro-esophageal reflux disease without esophagitis: Secondary | ICD-10-CM | POA: Diagnosis not present

## 2018-10-27 DIAGNOSIS — M47816 Spondylosis without myelopathy or radiculopathy, lumbar region: Secondary | ICD-10-CM | POA: Diagnosis not present

## 2018-10-27 DIAGNOSIS — M545 Low back pain: Secondary | ICD-10-CM | POA: Diagnosis not present

## 2018-10-27 DIAGNOSIS — M48062 Spinal stenosis, lumbar region with neurogenic claudication: Secondary | ICD-10-CM | POA: Diagnosis not present

## 2018-10-27 DIAGNOSIS — G894 Chronic pain syndrome: Secondary | ICD-10-CM | POA: Diagnosis not present

## 2018-11-14 DIAGNOSIS — I10 Essential (primary) hypertension: Secondary | ICD-10-CM | POA: Diagnosis not present

## 2018-11-14 DIAGNOSIS — K210 Gastro-esophageal reflux disease with esophagitis: Secondary | ICD-10-CM | POA: Diagnosis not present

## 2018-11-14 DIAGNOSIS — E782 Mixed hyperlipidemia: Secondary | ICD-10-CM | POA: Diagnosis not present

## 2018-11-14 DIAGNOSIS — J449 Chronic obstructive pulmonary disease, unspecified: Secondary | ICD-10-CM | POA: Diagnosis not present

## 2018-11-14 DIAGNOSIS — Z9189 Other specified personal risk factors, not elsewhere classified: Secondary | ICD-10-CM | POA: Diagnosis not present

## 2018-11-19 DIAGNOSIS — Z23 Encounter for immunization: Secondary | ICD-10-CM | POA: Diagnosis not present

## 2018-11-19 DIAGNOSIS — Z0001 Encounter for general adult medical examination with abnormal findings: Secondary | ICD-10-CM | POA: Diagnosis not present

## 2018-11-19 DIAGNOSIS — I10 Essential (primary) hypertension: Secondary | ICD-10-CM | POA: Diagnosis not present

## 2018-11-24 DIAGNOSIS — E785 Hyperlipidemia, unspecified: Secondary | ICD-10-CM | POA: Diagnosis not present

## 2018-11-24 DIAGNOSIS — G500 Trigeminal neuralgia: Secondary | ICD-10-CM | POA: Diagnosis not present

## 2018-11-24 DIAGNOSIS — M810 Age-related osteoporosis without current pathological fracture: Secondary | ICD-10-CM | POA: Diagnosis not present

## 2018-11-24 DIAGNOSIS — R296 Repeated falls: Secondary | ICD-10-CM | POA: Diagnosis not present

## 2018-11-24 DIAGNOSIS — M1990 Unspecified osteoarthritis, unspecified site: Secondary | ICD-10-CM | POA: Diagnosis not present

## 2018-11-24 DIAGNOSIS — Z9181 History of falling: Secondary | ICD-10-CM | POA: Diagnosis not present

## 2018-11-28 DIAGNOSIS — G500 Trigeminal neuralgia: Secondary | ICD-10-CM | POA: Diagnosis not present

## 2018-11-28 DIAGNOSIS — R296 Repeated falls: Secondary | ICD-10-CM | POA: Diagnosis not present

## 2018-11-28 DIAGNOSIS — Z9181 History of falling: Secondary | ICD-10-CM | POA: Diagnosis not present

## 2018-11-28 DIAGNOSIS — M810 Age-related osteoporosis without current pathological fracture: Secondary | ICD-10-CM | POA: Diagnosis not present

## 2018-11-28 DIAGNOSIS — M1990 Unspecified osteoarthritis, unspecified site: Secondary | ICD-10-CM | POA: Diagnosis not present

## 2018-11-28 DIAGNOSIS — E785 Hyperlipidemia, unspecified: Secondary | ICD-10-CM | POA: Diagnosis not present

## 2018-12-01 DIAGNOSIS — M1990 Unspecified osteoarthritis, unspecified site: Secondary | ICD-10-CM | POA: Diagnosis not present

## 2018-12-01 DIAGNOSIS — Z9181 History of falling: Secondary | ICD-10-CM | POA: Diagnosis not present

## 2018-12-01 DIAGNOSIS — R296 Repeated falls: Secondary | ICD-10-CM | POA: Diagnosis not present

## 2018-12-01 DIAGNOSIS — M810 Age-related osteoporosis without current pathological fracture: Secondary | ICD-10-CM | POA: Diagnosis not present

## 2018-12-01 DIAGNOSIS — E785 Hyperlipidemia, unspecified: Secondary | ICD-10-CM | POA: Diagnosis not present

## 2018-12-01 DIAGNOSIS — G500 Trigeminal neuralgia: Secondary | ICD-10-CM | POA: Diagnosis not present

## 2018-12-03 DIAGNOSIS — G500 Trigeminal neuralgia: Secondary | ICD-10-CM | POA: Diagnosis not present

## 2018-12-03 DIAGNOSIS — M810 Age-related osteoporosis without current pathological fracture: Secondary | ICD-10-CM | POA: Diagnosis not present

## 2018-12-03 DIAGNOSIS — R296 Repeated falls: Secondary | ICD-10-CM | POA: Diagnosis not present

## 2018-12-03 DIAGNOSIS — Z9181 History of falling: Secondary | ICD-10-CM | POA: Diagnosis not present

## 2018-12-03 DIAGNOSIS — M1990 Unspecified osteoarthritis, unspecified site: Secondary | ICD-10-CM | POA: Diagnosis not present

## 2018-12-03 DIAGNOSIS — E785 Hyperlipidemia, unspecified: Secondary | ICD-10-CM | POA: Diagnosis not present

## 2018-12-08 DIAGNOSIS — G500 Trigeminal neuralgia: Secondary | ICD-10-CM | POA: Diagnosis not present

## 2018-12-08 DIAGNOSIS — M1990 Unspecified osteoarthritis, unspecified site: Secondary | ICD-10-CM | POA: Diagnosis not present

## 2018-12-08 DIAGNOSIS — R296 Repeated falls: Secondary | ICD-10-CM | POA: Diagnosis not present

## 2018-12-08 DIAGNOSIS — Z9181 History of falling: Secondary | ICD-10-CM | POA: Diagnosis not present

## 2018-12-08 DIAGNOSIS — M810 Age-related osteoporosis without current pathological fracture: Secondary | ICD-10-CM | POA: Diagnosis not present

## 2018-12-08 DIAGNOSIS — E785 Hyperlipidemia, unspecified: Secondary | ICD-10-CM | POA: Diagnosis not present

## 2018-12-10 DIAGNOSIS — M1990 Unspecified osteoarthritis, unspecified site: Secondary | ICD-10-CM | POA: Diagnosis not present

## 2018-12-10 DIAGNOSIS — M810 Age-related osteoporosis without current pathological fracture: Secondary | ICD-10-CM | POA: Diagnosis not present

## 2018-12-10 DIAGNOSIS — E785 Hyperlipidemia, unspecified: Secondary | ICD-10-CM | POA: Diagnosis not present

## 2018-12-10 DIAGNOSIS — R296 Repeated falls: Secondary | ICD-10-CM | POA: Diagnosis not present

## 2018-12-10 DIAGNOSIS — G500 Trigeminal neuralgia: Secondary | ICD-10-CM | POA: Diagnosis not present

## 2018-12-10 DIAGNOSIS — Z9181 History of falling: Secondary | ICD-10-CM | POA: Diagnosis not present

## 2018-12-15 DIAGNOSIS — M545 Low back pain: Secondary | ICD-10-CM | POA: Diagnosis not present

## 2018-12-15 DIAGNOSIS — Z79891 Long term (current) use of opiate analgesic: Secondary | ICD-10-CM | POA: Diagnosis not present

## 2018-12-15 DIAGNOSIS — M48062 Spinal stenosis, lumbar region with neurogenic claudication: Secondary | ICD-10-CM | POA: Diagnosis not present

## 2018-12-15 DIAGNOSIS — G894 Chronic pain syndrome: Secondary | ICD-10-CM | POA: Diagnosis not present

## 2018-12-15 DIAGNOSIS — M47816 Spondylosis without myelopathy or radiculopathy, lumbar region: Secondary | ICD-10-CM | POA: Diagnosis not present

## 2018-12-18 DIAGNOSIS — Z9181 History of falling: Secondary | ICD-10-CM | POA: Diagnosis not present

## 2018-12-18 DIAGNOSIS — E785 Hyperlipidemia, unspecified: Secondary | ICD-10-CM | POA: Diagnosis not present

## 2018-12-18 DIAGNOSIS — R296 Repeated falls: Secondary | ICD-10-CM | POA: Diagnosis not present

## 2018-12-18 DIAGNOSIS — G500 Trigeminal neuralgia: Secondary | ICD-10-CM | POA: Diagnosis not present

## 2018-12-18 DIAGNOSIS — M810 Age-related osteoporosis without current pathological fracture: Secondary | ICD-10-CM | POA: Diagnosis not present

## 2018-12-18 DIAGNOSIS — M1990 Unspecified osteoarthritis, unspecified site: Secondary | ICD-10-CM | POA: Diagnosis not present

## 2018-12-22 DIAGNOSIS — M810 Age-related osteoporosis without current pathological fracture: Secondary | ICD-10-CM | POA: Diagnosis not present

## 2018-12-22 DIAGNOSIS — M1990 Unspecified osteoarthritis, unspecified site: Secondary | ICD-10-CM | POA: Diagnosis not present

## 2018-12-22 DIAGNOSIS — E785 Hyperlipidemia, unspecified: Secondary | ICD-10-CM | POA: Diagnosis not present

## 2018-12-22 DIAGNOSIS — Z9181 History of falling: Secondary | ICD-10-CM | POA: Diagnosis not present

## 2018-12-22 DIAGNOSIS — R296 Repeated falls: Secondary | ICD-10-CM | POA: Diagnosis not present

## 2018-12-22 DIAGNOSIS — G500 Trigeminal neuralgia: Secondary | ICD-10-CM | POA: Diagnosis not present

## 2018-12-25 DIAGNOSIS — E785 Hyperlipidemia, unspecified: Secondary | ICD-10-CM | POA: Diagnosis not present

## 2018-12-25 DIAGNOSIS — M1990 Unspecified osteoarthritis, unspecified site: Secondary | ICD-10-CM | POA: Diagnosis not present

## 2018-12-25 DIAGNOSIS — G500 Trigeminal neuralgia: Secondary | ICD-10-CM | POA: Diagnosis not present

## 2018-12-25 DIAGNOSIS — R296 Repeated falls: Secondary | ICD-10-CM | POA: Diagnosis not present

## 2018-12-25 DIAGNOSIS — M810 Age-related osteoporosis without current pathological fracture: Secondary | ICD-10-CM | POA: Diagnosis not present

## 2018-12-25 DIAGNOSIS — Z9181 History of falling: Secondary | ICD-10-CM | POA: Diagnosis not present

## 2018-12-29 DIAGNOSIS — M1990 Unspecified osteoarthritis, unspecified site: Secondary | ICD-10-CM | POA: Diagnosis not present

## 2018-12-29 DIAGNOSIS — G500 Trigeminal neuralgia: Secondary | ICD-10-CM | POA: Diagnosis not present

## 2018-12-29 DIAGNOSIS — Z9181 History of falling: Secondary | ICD-10-CM | POA: Diagnosis not present

## 2018-12-29 DIAGNOSIS — M810 Age-related osteoporosis without current pathological fracture: Secondary | ICD-10-CM | POA: Diagnosis not present

## 2018-12-29 DIAGNOSIS — E785 Hyperlipidemia, unspecified: Secondary | ICD-10-CM | POA: Diagnosis not present

## 2018-12-29 DIAGNOSIS — R296 Repeated falls: Secondary | ICD-10-CM | POA: Diagnosis not present

## 2018-12-31 DIAGNOSIS — G500 Trigeminal neuralgia: Secondary | ICD-10-CM | POA: Diagnosis not present

## 2018-12-31 DIAGNOSIS — M810 Age-related osteoporosis without current pathological fracture: Secondary | ICD-10-CM | POA: Diagnosis not present

## 2018-12-31 DIAGNOSIS — R296 Repeated falls: Secondary | ICD-10-CM | POA: Diagnosis not present

## 2018-12-31 DIAGNOSIS — E785 Hyperlipidemia, unspecified: Secondary | ICD-10-CM | POA: Diagnosis not present

## 2018-12-31 DIAGNOSIS — M1990 Unspecified osteoarthritis, unspecified site: Secondary | ICD-10-CM | POA: Diagnosis not present

## 2018-12-31 DIAGNOSIS — Z9181 History of falling: Secondary | ICD-10-CM | POA: Diagnosis not present

## 2019-01-05 DIAGNOSIS — G500 Trigeminal neuralgia: Secondary | ICD-10-CM | POA: Diagnosis not present

## 2019-01-05 DIAGNOSIS — R296 Repeated falls: Secondary | ICD-10-CM | POA: Diagnosis not present

## 2019-01-05 DIAGNOSIS — M1990 Unspecified osteoarthritis, unspecified site: Secondary | ICD-10-CM | POA: Diagnosis not present

## 2019-01-05 DIAGNOSIS — Z9181 History of falling: Secondary | ICD-10-CM | POA: Diagnosis not present

## 2019-01-05 DIAGNOSIS — M810 Age-related osteoporosis without current pathological fracture: Secondary | ICD-10-CM | POA: Diagnosis not present

## 2019-01-05 DIAGNOSIS — E785 Hyperlipidemia, unspecified: Secondary | ICD-10-CM | POA: Diagnosis not present

## 2019-01-07 DIAGNOSIS — Z9181 History of falling: Secondary | ICD-10-CM | POA: Diagnosis not present

## 2019-01-07 DIAGNOSIS — R296 Repeated falls: Secondary | ICD-10-CM | POA: Diagnosis not present

## 2019-01-07 DIAGNOSIS — M810 Age-related osteoporosis without current pathological fracture: Secondary | ICD-10-CM | POA: Diagnosis not present

## 2019-01-07 DIAGNOSIS — G500 Trigeminal neuralgia: Secondary | ICD-10-CM | POA: Diagnosis not present

## 2019-01-07 DIAGNOSIS — M1990 Unspecified osteoarthritis, unspecified site: Secondary | ICD-10-CM | POA: Diagnosis not present

## 2019-01-07 DIAGNOSIS — E785 Hyperlipidemia, unspecified: Secondary | ICD-10-CM | POA: Diagnosis not present

## 2019-01-07 IMAGING — MR MR LUMBAR SPINE W/O CM
4 of 5 series · 24 of 48 positions shown · non-contrast
Comparison: MRI lumbar spine 09/06/2011.

CLINICAL DATA: Low back pain with bilateral lower extremity
weakness and a sensation leg heaviness for 6 months. No known
injury.

EXAM:
MRI LUMBAR SPINE WITHOUT CONTRAST
TECHNIQUE: Multiplanar, multisequence MR imaging of the lumbar spine was
performed. No intravenous contrast was administered.

[Series 3: T2 · sagittal · 4.0mm · 0.47mm/px · 6 of 12 slices shown (1 of 2)]
[im 1/12]
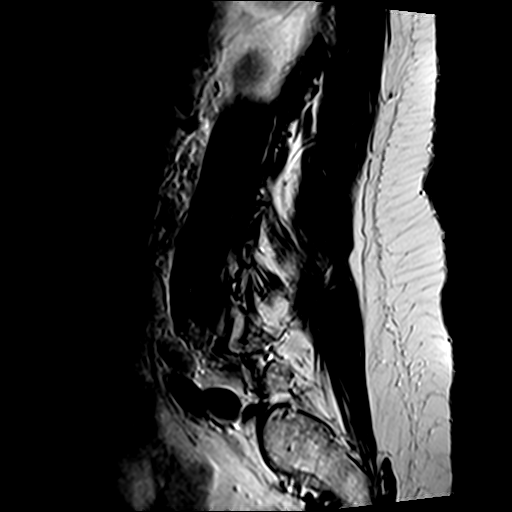
[im 3/12]
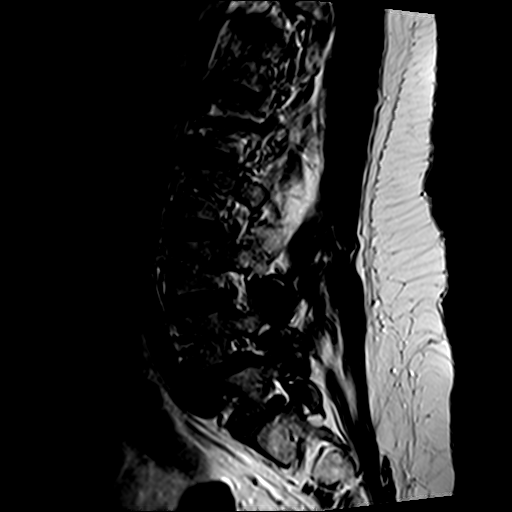
[im 5/12]
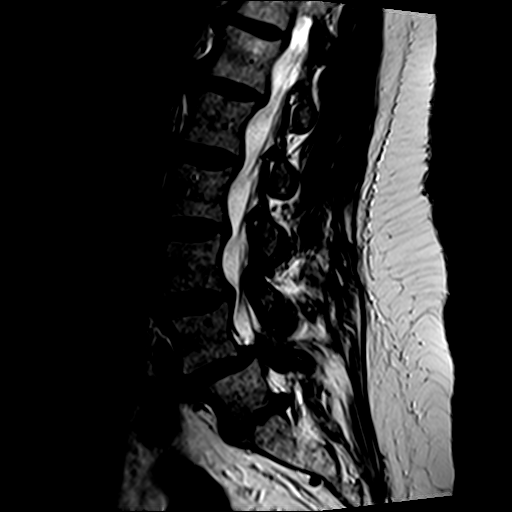
[im 7/12]
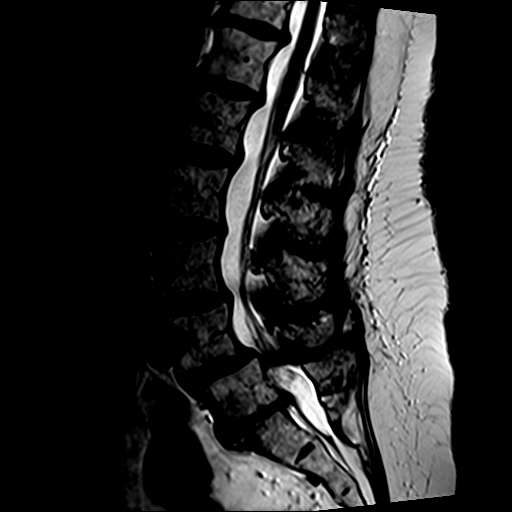
[im 9/12]
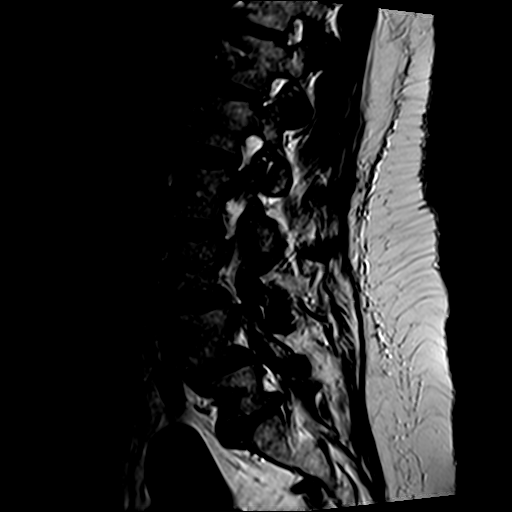
[im 12/12]
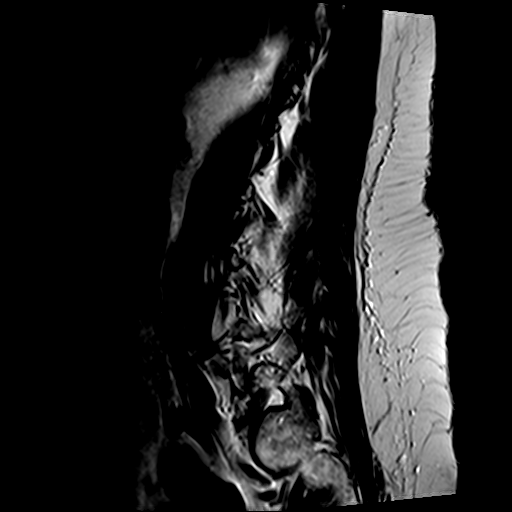

[Series 4: T1 · sagittal · 4.0mm · 0.47mm/px · 6 of 12 slices shown (1 of 2)]
[im 1/12]
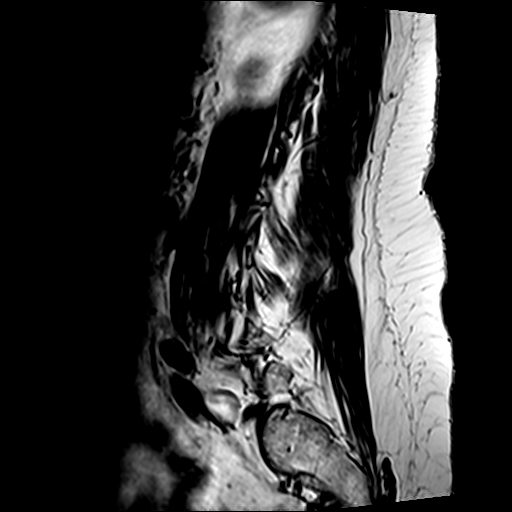
[im 3/12]
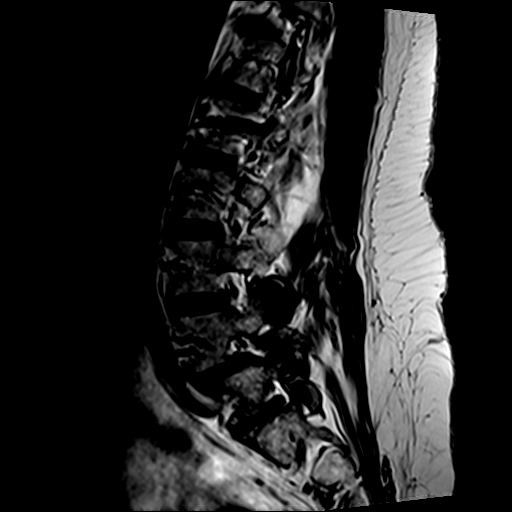
[im 5/12]
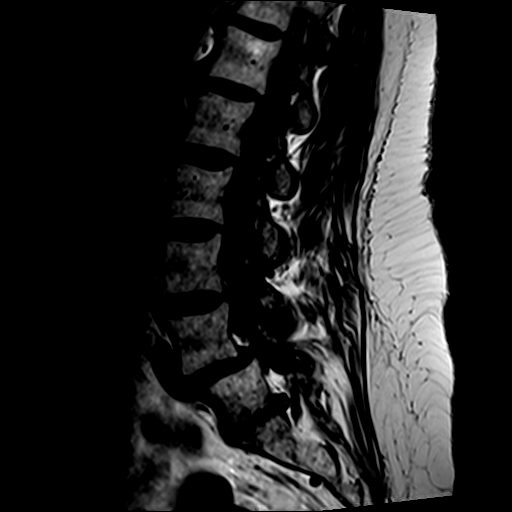
[im 7/12]
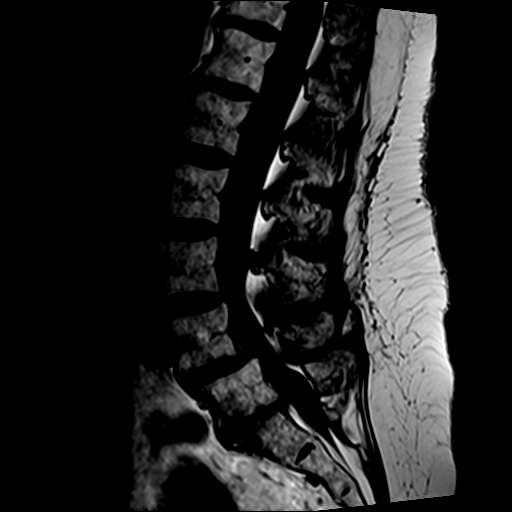
[im 9/12]
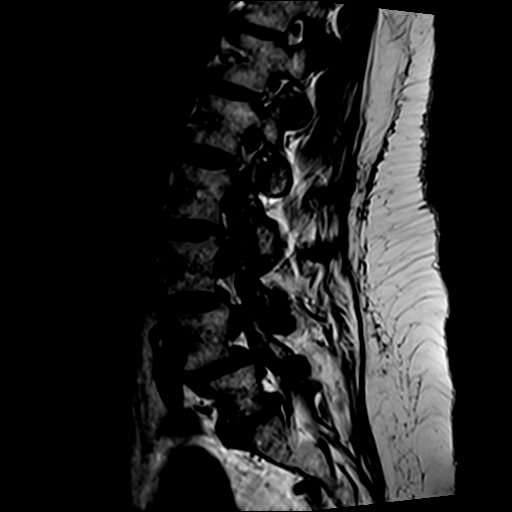
[im 12/12]
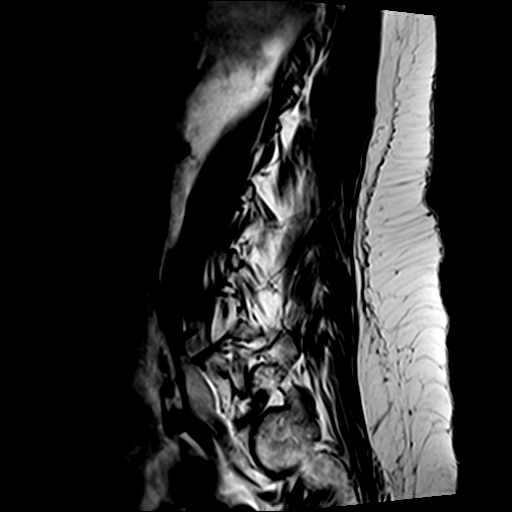

[Series 5: T1 · axial · 4.0mm · 0.37mm/px · z∈[-62,+125]mm · 3 of 32 slices shown (2 of 2)]
[im 5/32]
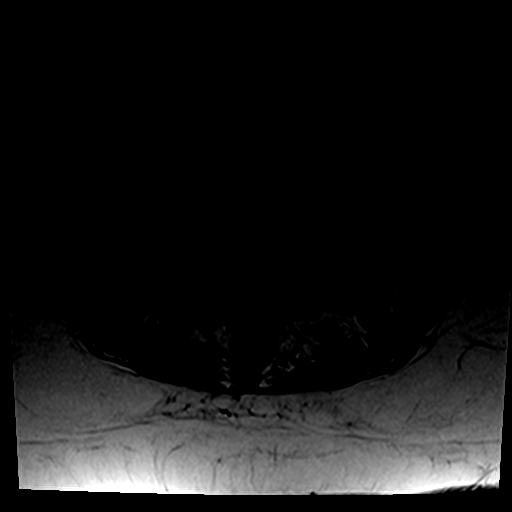
[im 16/32]
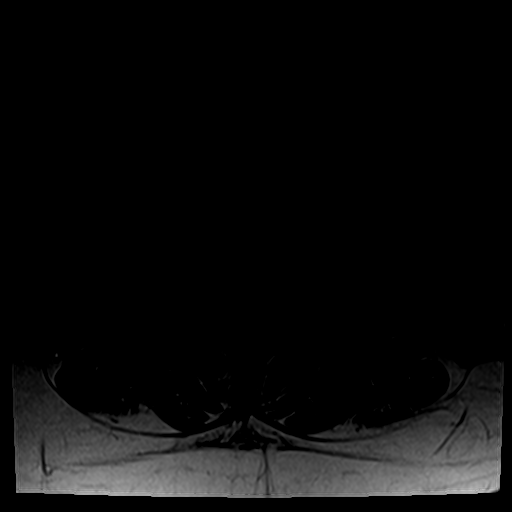
[im 27/32]
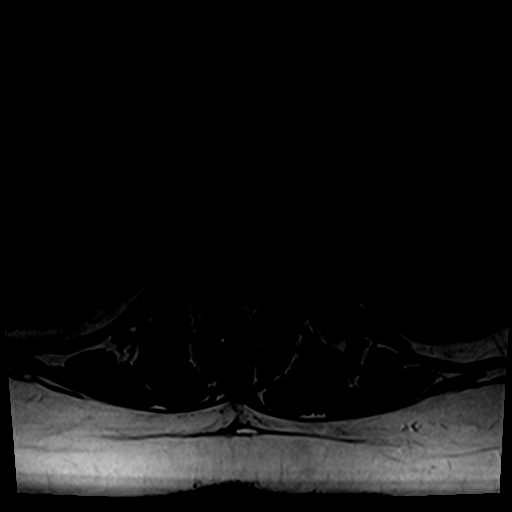

[Series 7: T2 · axial · 4.0mm · 0.74mm/px · z∈[-82,+163]mm · 9 of 32 slices shown (2 of 2)]
[im 1/32]
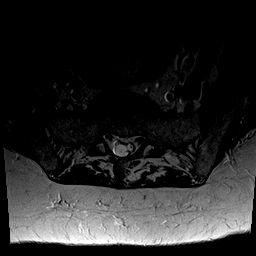
[im 5/32]
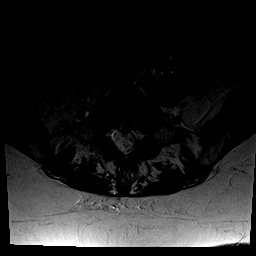
[im 9/32]
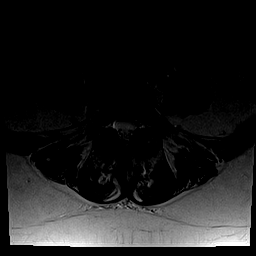
[im 14/32]
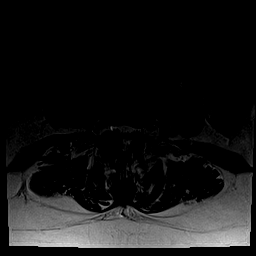
[im 16/32]
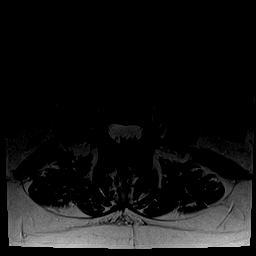
[im 18/32]
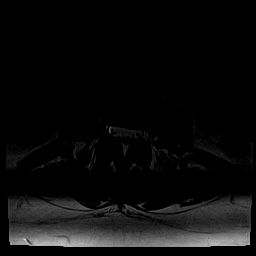
[im 23/32]
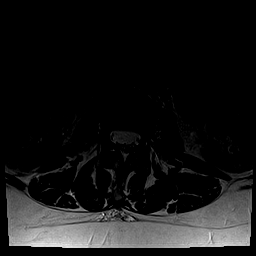
[im 27/32]
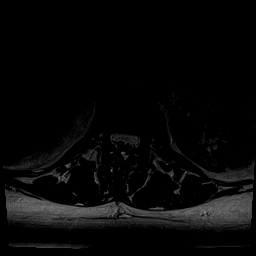
[im 32/32]
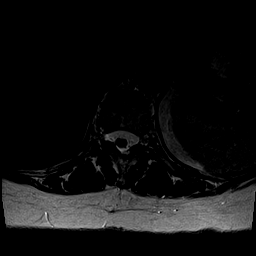

[24 of 48 positions shown; findings below may reference images not displayed]

FINDINGS: Segmentation:  Standard.

Alignment: Facet arthropathy results in 0.7 cm anterolisthesis L4 on
L5 which is increased from 0.4 cm on the prior MRI. Alignment is
otherwise maintained.

Vertebrae: No fracture or worrisome lesion. Degenerative endplate
signal change is seen at L5-S1.

Conus medullaris: Extends to the L1 level and appears normal.

Paraspinal and other soft tissues: Negative.

Disc levels:

T11-12: There has been some progression of disease at this level.
Central and right paracentral protrusion is increased in size but
the central canal and foramina remain open.

T12-L1: Right paracentral protrusion is increased in size somewhat
but the central canal and foramina remain open.

L1-2: This level is improved in appearance. Previously seen right
paracentral protrusion has resolved. There is a shallow disc bulge
but the central canal and foramina are open.

L2-3: Shallow broad-based left paracentral protrusion without
stenosis is not notably changed. There is some facet degenerative
disease, more notable on the right.

L3-4: There has been some progression of disease at this level.
Moderate to advanced facet arthropathy, disc bulge and ligamentum
flavum thickening are seen. There is moderate central canal stenosis
and right worse than left subarticular recess narrowing. Disc
impinges on the descending L4 roots, worse on the right.

L4-5: There has been progression of disease at this level. The
patient has advanced facet arthropathy and ligamentum flavum
thickening. The disc is uncovered with a bulge. There is severe
central canal and bilateral subarticular recess stenosis. Mild
bilateral foraminal narrowing is seen.

L5-S1: Large left paracentral disc protrusion seen on the prior
examination persists but appears smaller. There is decreased
impingement on the descending left S1 root compared to the prior
exam. The disc continues to deform the left side of the thecal sac.
Mild bilateral foraminal narrowing is unchanged.
IMPRESSION: Worsened spondylosis at L4-5 where facet degenerative change results
in 0.7 cm anterolisthesis. There is severe central canal and
bilateral subarticular recess narrowing at this level.

Worsened spondylosis at L3-4 where there is moderate central canal
stenosis and right worse than left subarticular recess narrowing.
Either descending L4 root could be impacted, particularly the right
L4 root.

Improved appearance of L1-2 where a previously seen right
paracentral protrusion has resolved.

Improved appearance of L5-S1 where left paracentral protrusion
deforming the left aspect of the thecal sac is again seen but
appears smaller with less encroachment on the descending left S1
root compared to the prior MRI.

## 2019-01-12 DIAGNOSIS — R296 Repeated falls: Secondary | ICD-10-CM | POA: Diagnosis not present

## 2019-01-12 DIAGNOSIS — M810 Age-related osteoporosis without current pathological fracture: Secondary | ICD-10-CM | POA: Diagnosis not present

## 2019-01-12 DIAGNOSIS — M1990 Unspecified osteoarthritis, unspecified site: Secondary | ICD-10-CM | POA: Diagnosis not present

## 2019-01-12 DIAGNOSIS — E785 Hyperlipidemia, unspecified: Secondary | ICD-10-CM | POA: Diagnosis not present

## 2019-01-12 DIAGNOSIS — Z9181 History of falling: Secondary | ICD-10-CM | POA: Diagnosis not present

## 2019-01-12 DIAGNOSIS — G500 Trigeminal neuralgia: Secondary | ICD-10-CM | POA: Diagnosis not present

## 2019-01-14 DIAGNOSIS — Z9181 History of falling: Secondary | ICD-10-CM | POA: Diagnosis not present

## 2019-01-14 DIAGNOSIS — R296 Repeated falls: Secondary | ICD-10-CM | POA: Diagnosis not present

## 2019-01-14 DIAGNOSIS — M810 Age-related osteoporosis without current pathological fracture: Secondary | ICD-10-CM | POA: Diagnosis not present

## 2019-01-14 DIAGNOSIS — M1990 Unspecified osteoarthritis, unspecified site: Secondary | ICD-10-CM | POA: Diagnosis not present

## 2019-01-14 DIAGNOSIS — G500 Trigeminal neuralgia: Secondary | ICD-10-CM | POA: Diagnosis not present

## 2019-01-14 DIAGNOSIS — E785 Hyperlipidemia, unspecified: Secondary | ICD-10-CM | POA: Diagnosis not present

## 2019-01-19 DIAGNOSIS — R296 Repeated falls: Secondary | ICD-10-CM | POA: Diagnosis not present

## 2019-01-19 DIAGNOSIS — Z9181 History of falling: Secondary | ICD-10-CM | POA: Diagnosis not present

## 2019-01-19 DIAGNOSIS — E785 Hyperlipidemia, unspecified: Secondary | ICD-10-CM | POA: Diagnosis not present

## 2019-01-19 DIAGNOSIS — G500 Trigeminal neuralgia: Secondary | ICD-10-CM | POA: Diagnosis not present

## 2019-01-19 DIAGNOSIS — M810 Age-related osteoporosis without current pathological fracture: Secondary | ICD-10-CM | POA: Diagnosis not present

## 2019-01-19 DIAGNOSIS — M1990 Unspecified osteoarthritis, unspecified site: Secondary | ICD-10-CM | POA: Diagnosis not present

## 2019-02-16 DIAGNOSIS — G894 Chronic pain syndrome: Secondary | ICD-10-CM | POA: Diagnosis not present

## 2019-02-16 DIAGNOSIS — M47816 Spondylosis without myelopathy or radiculopathy, lumbar region: Secondary | ICD-10-CM | POA: Diagnosis not present

## 2019-02-16 DIAGNOSIS — M545 Low back pain: Secondary | ICD-10-CM | POA: Diagnosis not present

## 2019-02-16 DIAGNOSIS — M48062 Spinal stenosis, lumbar region with neurogenic claudication: Secondary | ICD-10-CM | POA: Diagnosis not present

## 2019-02-27 ENCOUNTER — Other Ambulatory Visit: Payer: Self-pay

## 2019-02-27 NOTE — Patient Outreach (Signed)
[facility]) Care Management  02/27/2019  CORYN RESENDES December 01, 1956 [id number]   Medication Adherence call to Mrs. Hoover Browns  spoke with patient she is due on Atorvastatin 10 mg patient explain she is only taking 1 tablet every week but before she was taking 1 tablet daily and has medication for about a month,patient will fill the prescription in a month. Mrs. Schellinger is showing past due under [facility] Ins.   Lillia Abed CPhT Pharmacy Technician [facility] Management Direct Dial [phone]  Fax [phone] [email or web address]

## 2019-03-23 DIAGNOSIS — R739 Hyperglycemia, unspecified: Secondary | ICD-10-CM | POA: Diagnosis not present

## 2019-03-23 DIAGNOSIS — K210 Gastro-esophageal reflux disease with esophagitis: Secondary | ICD-10-CM | POA: Diagnosis not present

## 2019-03-23 DIAGNOSIS — E782 Mixed hyperlipidemia: Secondary | ICD-10-CM | POA: Diagnosis not present

## 2019-03-23 DIAGNOSIS — I10 Essential (primary) hypertension: Secondary | ICD-10-CM | POA: Diagnosis not present

## 2019-03-23 DIAGNOSIS — J449 Chronic obstructive pulmonary disease, unspecified: Secondary | ICD-10-CM | POA: Diagnosis not present

## 2019-03-26 DIAGNOSIS — M810 Age-related osteoporosis without current pathological fracture: Secondary | ICD-10-CM | POA: Diagnosis not present

## 2019-03-26 DIAGNOSIS — J301 Allergic rhinitis due to pollen: Secondary | ICD-10-CM | POA: Diagnosis not present

## 2019-03-26 DIAGNOSIS — E782 Mixed hyperlipidemia: Secondary | ICD-10-CM | POA: Diagnosis not present

## 2019-03-26 DIAGNOSIS — I10 Essential (primary) hypertension: Secondary | ICD-10-CM | POA: Diagnosis not present

## 2019-04-13 DIAGNOSIS — G894 Chronic pain syndrome: Secondary | ICD-10-CM | POA: Diagnosis not present

## 2019-04-13 DIAGNOSIS — M47816 Spondylosis without myelopathy or radiculopathy, lumbar region: Secondary | ICD-10-CM | POA: Diagnosis not present

## 2019-04-13 DIAGNOSIS — M545 Low back pain: Secondary | ICD-10-CM | POA: Diagnosis not present

## 2019-04-13 DIAGNOSIS — M48062 Spinal stenosis, lumbar region with neurogenic claudication: Secondary | ICD-10-CM | POA: Diagnosis not present

## 2019-06-16 DIAGNOSIS — G894 Chronic pain syndrome: Secondary | ICD-10-CM | POA: Diagnosis not present

## 2019-06-16 DIAGNOSIS — M47816 Spondylosis without myelopathy or radiculopathy, lumbar region: Secondary | ICD-10-CM | POA: Diagnosis not present

## 2019-06-16 DIAGNOSIS — M545 Low back pain: Secondary | ICD-10-CM | POA: Diagnosis not present

## 2019-06-16 DIAGNOSIS — M48062 Spinal stenosis, lumbar region with neurogenic claudication: Secondary | ICD-10-CM | POA: Diagnosis not present

## 2019-06-16 DIAGNOSIS — Z79891 Long term (current) use of opiate analgesic: Secondary | ICD-10-CM | POA: Diagnosis not present

## 2019-07-03 DIAGNOSIS — K047 Periapical abscess without sinus: Secondary | ICD-10-CM | POA: Diagnosis not present

## 2019-07-17 DIAGNOSIS — I8390 Asymptomatic varicose veins of unspecified lower extremity: Secondary | ICD-10-CM | POA: Diagnosis not present

## 2019-07-17 DIAGNOSIS — I10 Essential (primary) hypertension: Secondary | ICD-10-CM | POA: Diagnosis not present

## 2019-07-17 DIAGNOSIS — E782 Mixed hyperlipidemia: Secondary | ICD-10-CM | POA: Diagnosis not present

## 2019-07-17 DIAGNOSIS — M170 Bilateral primary osteoarthritis of knee: Secondary | ICD-10-CM | POA: Diagnosis not present

## 2019-07-17 DIAGNOSIS — K219 Gastro-esophageal reflux disease without esophagitis: Secondary | ICD-10-CM | POA: Diagnosis not present

## 2019-07-17 DIAGNOSIS — J449 Chronic obstructive pulmonary disease, unspecified: Secondary | ICD-10-CM | POA: Diagnosis not present

## 2019-07-17 DIAGNOSIS — M810 Age-related osteoporosis without current pathological fracture: Secondary | ICD-10-CM | POA: Diagnosis not present

## 2019-07-17 DIAGNOSIS — I872 Venous insufficiency (chronic) (peripheral): Secondary | ICD-10-CM | POA: Diagnosis not present

## 2019-07-18 DIAGNOSIS — E782 Mixed hyperlipidemia: Secondary | ICD-10-CM | POA: Diagnosis not present

## 2019-07-18 DIAGNOSIS — I8390 Asymptomatic varicose veins of unspecified lower extremity: Secondary | ICD-10-CM | POA: Diagnosis not present

## 2019-07-18 DIAGNOSIS — M170 Bilateral primary osteoarthritis of knee: Secondary | ICD-10-CM | POA: Diagnosis not present

## 2019-07-18 DIAGNOSIS — I10 Essential (primary) hypertension: Secondary | ICD-10-CM | POA: Diagnosis not present

## 2019-07-18 DIAGNOSIS — M810 Age-related osteoporosis without current pathological fracture: Secondary | ICD-10-CM | POA: Diagnosis not present

## 2019-07-18 DIAGNOSIS — K219 Gastro-esophageal reflux disease without esophagitis: Secondary | ICD-10-CM | POA: Diagnosis not present

## 2019-07-18 DIAGNOSIS — I872 Venous insufficiency (chronic) (peripheral): Secondary | ICD-10-CM | POA: Diagnosis not present

## 2019-07-18 DIAGNOSIS — J449 Chronic obstructive pulmonary disease, unspecified: Secondary | ICD-10-CM | POA: Diagnosis not present

## 2019-07-22 DIAGNOSIS — I10 Essential (primary) hypertension: Secondary | ICD-10-CM | POA: Diagnosis not present

## 2019-07-22 DIAGNOSIS — M170 Bilateral primary osteoarthritis of knee: Secondary | ICD-10-CM | POA: Diagnosis not present

## 2019-07-22 DIAGNOSIS — K219 Gastro-esophageal reflux disease without esophagitis: Secondary | ICD-10-CM | POA: Diagnosis not present

## 2019-07-22 DIAGNOSIS — E782 Mixed hyperlipidemia: Secondary | ICD-10-CM | POA: Diagnosis not present

## 2019-07-22 DIAGNOSIS — I8390 Asymptomatic varicose veins of unspecified lower extremity: Secondary | ICD-10-CM | POA: Diagnosis not present

## 2019-07-22 DIAGNOSIS — I872 Venous insufficiency (chronic) (peripheral): Secondary | ICD-10-CM | POA: Diagnosis not present

## 2019-07-22 DIAGNOSIS — M810 Age-related osteoporosis without current pathological fracture: Secondary | ICD-10-CM | POA: Diagnosis not present

## 2019-07-22 DIAGNOSIS — J449 Chronic obstructive pulmonary disease, unspecified: Secondary | ICD-10-CM | POA: Diagnosis not present

## 2019-07-27 DIAGNOSIS — I8390 Asymptomatic varicose veins of unspecified lower extremity: Secondary | ICD-10-CM | POA: Diagnosis not present

## 2019-07-27 DIAGNOSIS — K219 Gastro-esophageal reflux disease without esophagitis: Secondary | ICD-10-CM | POA: Diagnosis not present

## 2019-07-27 DIAGNOSIS — I10 Essential (primary) hypertension: Secondary | ICD-10-CM | POA: Diagnosis not present

## 2019-07-27 DIAGNOSIS — I872 Venous insufficiency (chronic) (peripheral): Secondary | ICD-10-CM | POA: Diagnosis not present

## 2019-07-28 DIAGNOSIS — E782 Mixed hyperlipidemia: Secondary | ICD-10-CM | POA: Diagnosis not present

## 2019-07-28 DIAGNOSIS — J449 Chronic obstructive pulmonary disease, unspecified: Secondary | ICD-10-CM | POA: Diagnosis not present

## 2019-07-28 DIAGNOSIS — M810 Age-related osteoporosis without current pathological fracture: Secondary | ICD-10-CM | POA: Diagnosis not present

## 2019-07-28 DIAGNOSIS — I8390 Asymptomatic varicose veins of unspecified lower extremity: Secondary | ICD-10-CM | POA: Diagnosis not present

## 2019-07-28 DIAGNOSIS — I10 Essential (primary) hypertension: Secondary | ICD-10-CM | POA: Diagnosis not present

## 2019-07-28 DIAGNOSIS — I872 Venous insufficiency (chronic) (peripheral): Secondary | ICD-10-CM | POA: Diagnosis not present

## 2019-07-28 DIAGNOSIS — M170 Bilateral primary osteoarthritis of knee: Secondary | ICD-10-CM | POA: Diagnosis not present

## 2019-07-28 DIAGNOSIS — K219 Gastro-esophageal reflux disease without esophagitis: Secondary | ICD-10-CM | POA: Diagnosis not present

## 2019-07-30 DIAGNOSIS — K219 Gastro-esophageal reflux disease without esophagitis: Secondary | ICD-10-CM | POA: Diagnosis not present

## 2019-07-30 DIAGNOSIS — I872 Venous insufficiency (chronic) (peripheral): Secondary | ICD-10-CM | POA: Diagnosis not present

## 2019-07-30 DIAGNOSIS — I10 Essential (primary) hypertension: Secondary | ICD-10-CM | POA: Diagnosis not present

## 2019-07-30 DIAGNOSIS — M810 Age-related osteoporosis without current pathological fracture: Secondary | ICD-10-CM | POA: Diagnosis not present

## 2019-07-30 DIAGNOSIS — G252 Other specified forms of tremor: Secondary | ICD-10-CM | POA: Diagnosis not present

## 2019-07-30 DIAGNOSIS — I8390 Asymptomatic varicose veins of unspecified lower extremity: Secondary | ICD-10-CM | POA: Diagnosis not present

## 2019-07-30 DIAGNOSIS — J449 Chronic obstructive pulmonary disease, unspecified: Secondary | ICD-10-CM | POA: Diagnosis not present

## 2019-07-30 DIAGNOSIS — E782 Mixed hyperlipidemia: Secondary | ICD-10-CM | POA: Diagnosis not present

## 2019-07-30 DIAGNOSIS — M170 Bilateral primary osteoarthritis of knee: Secondary | ICD-10-CM | POA: Diagnosis not present

## 2019-08-04 DIAGNOSIS — I8390 Asymptomatic varicose veins of unspecified lower extremity: Secondary | ICD-10-CM | POA: Diagnosis not present

## 2019-08-04 DIAGNOSIS — K219 Gastro-esophageal reflux disease without esophagitis: Secondary | ICD-10-CM | POA: Diagnosis not present

## 2019-08-04 DIAGNOSIS — I10 Essential (primary) hypertension: Secondary | ICD-10-CM | POA: Diagnosis not present

## 2019-08-04 DIAGNOSIS — M810 Age-related osteoporosis without current pathological fracture: Secondary | ICD-10-CM | POA: Diagnosis not present

## 2019-08-04 DIAGNOSIS — M170 Bilateral primary osteoarthritis of knee: Secondary | ICD-10-CM | POA: Diagnosis not present

## 2019-08-04 DIAGNOSIS — J449 Chronic obstructive pulmonary disease, unspecified: Secondary | ICD-10-CM | POA: Diagnosis not present

## 2019-08-04 DIAGNOSIS — E782 Mixed hyperlipidemia: Secondary | ICD-10-CM | POA: Diagnosis not present

## 2019-08-04 DIAGNOSIS — I872 Venous insufficiency (chronic) (peripheral): Secondary | ICD-10-CM | POA: Diagnosis not present

## 2019-08-06 DIAGNOSIS — M170 Bilateral primary osteoarthritis of knee: Secondary | ICD-10-CM | POA: Diagnosis not present

## 2019-08-06 DIAGNOSIS — M810 Age-related osteoporosis without current pathological fracture: Secondary | ICD-10-CM | POA: Diagnosis not present

## 2019-08-06 DIAGNOSIS — E782 Mixed hyperlipidemia: Secondary | ICD-10-CM | POA: Diagnosis not present

## 2019-08-06 DIAGNOSIS — I8390 Asymptomatic varicose veins of unspecified lower extremity: Secondary | ICD-10-CM | POA: Diagnosis not present

## 2019-08-06 DIAGNOSIS — I872 Venous insufficiency (chronic) (peripheral): Secondary | ICD-10-CM | POA: Diagnosis not present

## 2019-08-06 DIAGNOSIS — J449 Chronic obstructive pulmonary disease, unspecified: Secondary | ICD-10-CM | POA: Diagnosis not present

## 2019-08-06 DIAGNOSIS — K219 Gastro-esophageal reflux disease without esophagitis: Secondary | ICD-10-CM | POA: Diagnosis not present

## 2019-08-06 DIAGNOSIS — I10 Essential (primary) hypertension: Secondary | ICD-10-CM | POA: Diagnosis not present

## 2019-08-11 DIAGNOSIS — G894 Chronic pain syndrome: Secondary | ICD-10-CM | POA: Diagnosis not present

## 2019-08-11 DIAGNOSIS — M545 Low back pain: Secondary | ICD-10-CM | POA: Diagnosis not present

## 2019-08-11 DIAGNOSIS — Z79891 Long term (current) use of opiate analgesic: Secondary | ICD-10-CM | POA: Diagnosis not present

## 2019-08-11 DIAGNOSIS — M47816 Spondylosis without myelopathy or radiculopathy, lumbar region: Secondary | ICD-10-CM | POA: Diagnosis not present

## 2019-08-11 DIAGNOSIS — M48062 Spinal stenosis, lumbar region with neurogenic claudication: Secondary | ICD-10-CM | POA: Diagnosis not present

## 2019-08-13 DIAGNOSIS — I8390 Asymptomatic varicose veins of unspecified lower extremity: Secondary | ICD-10-CM | POA: Diagnosis not present

## 2019-08-13 DIAGNOSIS — M170 Bilateral primary osteoarthritis of knee: Secondary | ICD-10-CM | POA: Diagnosis not present

## 2019-08-13 DIAGNOSIS — I10 Essential (primary) hypertension: Secondary | ICD-10-CM | POA: Diagnosis not present

## 2019-08-13 DIAGNOSIS — I872 Venous insufficiency (chronic) (peripheral): Secondary | ICD-10-CM | POA: Diagnosis not present

## 2019-08-13 DIAGNOSIS — K219 Gastro-esophageal reflux disease without esophagitis: Secondary | ICD-10-CM | POA: Diagnosis not present

## 2019-08-13 DIAGNOSIS — J449 Chronic obstructive pulmonary disease, unspecified: Secondary | ICD-10-CM | POA: Diagnosis not present

## 2019-08-13 DIAGNOSIS — E782 Mixed hyperlipidemia: Secondary | ICD-10-CM | POA: Diagnosis not present

## 2019-08-13 DIAGNOSIS — M810 Age-related osteoporosis without current pathological fracture: Secondary | ICD-10-CM | POA: Diagnosis not present

## 2019-08-19 DIAGNOSIS — E782 Mixed hyperlipidemia: Secondary | ICD-10-CM | POA: Diagnosis not present

## 2019-08-19 DIAGNOSIS — I10 Essential (primary) hypertension: Secondary | ICD-10-CM | POA: Diagnosis not present

## 2019-08-19 DIAGNOSIS — I8390 Asymptomatic varicose veins of unspecified lower extremity: Secondary | ICD-10-CM | POA: Diagnosis not present

## 2019-08-19 DIAGNOSIS — K219 Gastro-esophageal reflux disease without esophagitis: Secondary | ICD-10-CM | POA: Diagnosis not present

## 2019-08-19 DIAGNOSIS — I872 Venous insufficiency (chronic) (peripheral): Secondary | ICD-10-CM | POA: Diagnosis not present

## 2019-08-19 DIAGNOSIS — M170 Bilateral primary osteoarthritis of knee: Secondary | ICD-10-CM | POA: Diagnosis not present

## 2019-08-19 DIAGNOSIS — J449 Chronic obstructive pulmonary disease, unspecified: Secondary | ICD-10-CM | POA: Diagnosis not present

## 2019-08-19 DIAGNOSIS — M810 Age-related osteoporosis without current pathological fracture: Secondary | ICD-10-CM | POA: Diagnosis not present

## 2019-08-24 DIAGNOSIS — I10 Essential (primary) hypertension: Secondary | ICD-10-CM | POA: Diagnosis not present

## 2019-08-24 DIAGNOSIS — E782 Mixed hyperlipidemia: Secondary | ICD-10-CM | POA: Diagnosis not present

## 2019-08-26 DIAGNOSIS — I10 Essential (primary) hypertension: Secondary | ICD-10-CM | POA: Diagnosis not present

## 2019-08-26 DIAGNOSIS — I872 Venous insufficiency (chronic) (peripheral): Secondary | ICD-10-CM | POA: Diagnosis not present

## 2019-08-26 DIAGNOSIS — M810 Age-related osteoporosis without current pathological fracture: Secondary | ICD-10-CM | POA: Diagnosis not present

## 2019-08-26 DIAGNOSIS — E782 Mixed hyperlipidemia: Secondary | ICD-10-CM | POA: Diagnosis not present

## 2019-08-26 DIAGNOSIS — J449 Chronic obstructive pulmonary disease, unspecified: Secondary | ICD-10-CM | POA: Diagnosis not present

## 2019-08-26 DIAGNOSIS — K219 Gastro-esophageal reflux disease without esophagitis: Secondary | ICD-10-CM | POA: Diagnosis not present

## 2019-08-26 DIAGNOSIS — M170 Bilateral primary osteoarthritis of knee: Secondary | ICD-10-CM | POA: Diagnosis not present

## 2019-08-26 DIAGNOSIS — I8390 Asymptomatic varicose veins of unspecified lower extremity: Secondary | ICD-10-CM | POA: Diagnosis not present

## 2019-08-28 DIAGNOSIS — M810 Age-related osteoporosis without current pathological fracture: Secondary | ICD-10-CM | POA: Diagnosis not present

## 2019-08-28 DIAGNOSIS — I8390 Asymptomatic varicose veins of unspecified lower extremity: Secondary | ICD-10-CM | POA: Diagnosis not present

## 2019-08-28 DIAGNOSIS — I10 Essential (primary) hypertension: Secondary | ICD-10-CM | POA: Diagnosis not present

## 2019-08-28 DIAGNOSIS — J449 Chronic obstructive pulmonary disease, unspecified: Secondary | ICD-10-CM | POA: Diagnosis not present

## 2019-08-28 DIAGNOSIS — E782 Mixed hyperlipidemia: Secondary | ICD-10-CM | POA: Diagnosis not present

## 2019-08-28 DIAGNOSIS — K219 Gastro-esophageal reflux disease without esophagitis: Secondary | ICD-10-CM | POA: Diagnosis not present

## 2019-08-28 DIAGNOSIS — M170 Bilateral primary osteoarthritis of knee: Secondary | ICD-10-CM | POA: Diagnosis not present

## 2019-08-28 DIAGNOSIS — I872 Venous insufficiency (chronic) (peripheral): Secondary | ICD-10-CM | POA: Diagnosis not present

## 2019-10-06 DIAGNOSIS — M545 Low back pain: Secondary | ICD-10-CM | POA: Diagnosis not present

## 2019-10-06 DIAGNOSIS — M47816 Spondylosis without myelopathy or radiculopathy, lumbar region: Secondary | ICD-10-CM | POA: Diagnosis not present

## 2019-10-06 DIAGNOSIS — M48061 Spinal stenosis, lumbar region without neurogenic claudication: Secondary | ICD-10-CM | POA: Diagnosis not present

## 2019-10-06 DIAGNOSIS — G894 Chronic pain syndrome: Secondary | ICD-10-CM | POA: Diagnosis not present

## 2019-11-18 DIAGNOSIS — E782 Mixed hyperlipidemia: Secondary | ICD-10-CM | POA: Diagnosis not present

## 2019-11-18 DIAGNOSIS — R739 Hyperglycemia, unspecified: Secondary | ICD-10-CM | POA: Diagnosis not present

## 2019-11-18 DIAGNOSIS — I10 Essential (primary) hypertension: Secondary | ICD-10-CM | POA: Diagnosis not present

## 2019-11-18 DIAGNOSIS — K219 Gastro-esophageal reflux disease without esophagitis: Secondary | ICD-10-CM | POA: Diagnosis not present

## 2019-11-18 DIAGNOSIS — J449 Chronic obstructive pulmonary disease, unspecified: Secondary | ICD-10-CM | POA: Diagnosis not present

## 2019-11-23 DIAGNOSIS — M810 Age-related osteoporosis without current pathological fracture: Secondary | ICD-10-CM | POA: Diagnosis not present

## 2019-11-23 DIAGNOSIS — K219 Gastro-esophageal reflux disease without esophagitis: Secondary | ICD-10-CM | POA: Diagnosis not present

## 2019-11-23 DIAGNOSIS — I10 Essential (primary) hypertension: Secondary | ICD-10-CM | POA: Diagnosis not present

## 2019-11-23 DIAGNOSIS — J449 Chronic obstructive pulmonary disease, unspecified: Secondary | ICD-10-CM | POA: Diagnosis not present

## 2019-11-23 DIAGNOSIS — Z0001 Encounter for general adult medical examination with abnormal findings: Secondary | ICD-10-CM | POA: Diagnosis not present

## 2019-11-30 DIAGNOSIS — R300 Dysuria: Secondary | ICD-10-CM | POA: Diagnosis not present

## 2019-11-30 DIAGNOSIS — Z7689 Persons encountering health services in other specified circumstances: Secondary | ICD-10-CM | POA: Diagnosis not present

## 2019-12-01 DIAGNOSIS — M47816 Spondylosis without myelopathy or radiculopathy, lumbar region: Secondary | ICD-10-CM | POA: Diagnosis not present

## 2019-12-01 DIAGNOSIS — Z79891 Long term (current) use of opiate analgesic: Secondary | ICD-10-CM | POA: Diagnosis not present

## 2019-12-01 DIAGNOSIS — M48061 Spinal stenosis, lumbar region without neurogenic claudication: Secondary | ICD-10-CM | POA: Diagnosis not present

## 2019-12-01 DIAGNOSIS — G894 Chronic pain syndrome: Secondary | ICD-10-CM | POA: Diagnosis not present

## 2019-12-01 DIAGNOSIS — M545 Low back pain: Secondary | ICD-10-CM | POA: Diagnosis not present

## 2020-01-26 DIAGNOSIS — M545 Low back pain: Secondary | ICD-10-CM | POA: Diagnosis not present

## 2020-01-26 DIAGNOSIS — G894 Chronic pain syndrome: Secondary | ICD-10-CM | POA: Diagnosis not present

## 2020-01-26 DIAGNOSIS — M47816 Spondylosis without myelopathy or radiculopathy, lumbar region: Secondary | ICD-10-CM | POA: Diagnosis not present

## 2020-01-26 DIAGNOSIS — M48061 Spinal stenosis, lumbar region without neurogenic claudication: Secondary | ICD-10-CM | POA: Diagnosis not present

## 2020-02-19 DIAGNOSIS — I10 Essential (primary) hypertension: Secondary | ICD-10-CM | POA: Diagnosis not present

## 2020-02-19 DIAGNOSIS — E7849 Other hyperlipidemia: Secondary | ICD-10-CM | POA: Diagnosis not present

## 2020-03-02 DIAGNOSIS — K219 Gastro-esophageal reflux disease without esophagitis: Secondary | ICD-10-CM | POA: Diagnosis not present

## 2020-03-02 DIAGNOSIS — I10 Essential (primary) hypertension: Secondary | ICD-10-CM | POA: Diagnosis not present

## 2020-03-02 DIAGNOSIS — F17210 Nicotine dependence, cigarettes, uncomplicated: Secondary | ICD-10-CM | POA: Diagnosis not present

## 2020-03-02 DIAGNOSIS — R4582 Worries: Secondary | ICD-10-CM | POA: Diagnosis not present

## 2020-03-02 DIAGNOSIS — E782 Mixed hyperlipidemia: Secondary | ICD-10-CM | POA: Diagnosis not present

## 2020-03-22 DIAGNOSIS — M48061 Spinal stenosis, lumbar region without neurogenic claudication: Secondary | ICD-10-CM | POA: Diagnosis not present

## 2020-03-22 DIAGNOSIS — G894 Chronic pain syndrome: Secondary | ICD-10-CM | POA: Diagnosis not present

## 2020-03-22 DIAGNOSIS — M47816 Spondylosis without myelopathy or radiculopathy, lumbar region: Secondary | ICD-10-CM | POA: Diagnosis not present

## 2020-03-22 DIAGNOSIS — M545 Low back pain: Secondary | ICD-10-CM | POA: Diagnosis not present

## 2020-04-21 DIAGNOSIS — L89301 Pressure ulcer of unspecified buttock, stage 1: Secondary | ICD-10-CM | POA: Diagnosis not present

## 2020-04-21 DIAGNOSIS — L89151 Pressure ulcer of sacral region, stage 1: Secondary | ICD-10-CM | POA: Diagnosis not present

## 2020-04-27 DIAGNOSIS — L89151 Pressure ulcer of sacral region, stage 1: Secondary | ICD-10-CM | POA: Diagnosis not present

## 2020-04-27 DIAGNOSIS — L89301 Pressure ulcer of unspecified buttock, stage 1: Secondary | ICD-10-CM | POA: Diagnosis not present

## 2020-04-27 DIAGNOSIS — B372 Candidiasis of skin and nail: Secondary | ICD-10-CM | POA: Diagnosis not present

## 2020-05-12 DIAGNOSIS — L22 Diaper dermatitis: Secondary | ICD-10-CM | POA: Diagnosis not present

## 2020-05-12 DIAGNOSIS — L03116 Cellulitis of left lower limb: Secondary | ICD-10-CM | POA: Diagnosis not present

## 2020-05-17 DIAGNOSIS — M545 Low back pain: Secondary | ICD-10-CM | POA: Diagnosis not present

## 2020-05-17 DIAGNOSIS — G894 Chronic pain syndrome: Secondary | ICD-10-CM | POA: Diagnosis not present

## 2020-05-17 DIAGNOSIS — M47816 Spondylosis without myelopathy or radiculopathy, lumbar region: Secondary | ICD-10-CM | POA: Diagnosis not present

## 2020-05-17 DIAGNOSIS — Z79891 Long term (current) use of opiate analgesic: Secondary | ICD-10-CM | POA: Diagnosis not present

## 2020-05-18 DIAGNOSIS — L22 Diaper dermatitis: Secondary | ICD-10-CM | POA: Diagnosis not present

## 2020-05-18 DIAGNOSIS — L03116 Cellulitis of left lower limb: Secondary | ICD-10-CM | POA: Diagnosis not present

## 2020-05-25 DIAGNOSIS — R600 Localized edema: Secondary | ICD-10-CM | POA: Diagnosis not present

## 2020-05-25 DIAGNOSIS — L03116 Cellulitis of left lower limb: Secondary | ICD-10-CM | POA: Diagnosis not present

## 2020-05-26 DIAGNOSIS — R600 Localized edema: Secondary | ICD-10-CM | POA: Diagnosis not present

## 2020-05-26 DIAGNOSIS — M79605 Pain in left leg: Secondary | ICD-10-CM | POA: Diagnosis not present

## 2020-05-26 DIAGNOSIS — M79604 Pain in right leg: Secondary | ICD-10-CM | POA: Diagnosis not present

## 2020-06-17 DIAGNOSIS — G319 Degenerative disease of nervous system, unspecified: Secondary | ICD-10-CM | POA: Diagnosis not present

## 2020-06-17 DIAGNOSIS — R9082 White matter disease, unspecified: Secondary | ICD-10-CM | POA: Diagnosis not present

## 2020-06-17 DIAGNOSIS — K5909 Other constipation: Secondary | ICD-10-CM | POA: Diagnosis not present

## 2020-06-17 DIAGNOSIS — Z7401 Bed confinement status: Secondary | ICD-10-CM | POA: Diagnosis not present

## 2020-06-17 DIAGNOSIS — R2689 Other abnormalities of gait and mobility: Secondary | ICD-10-CM | POA: Diagnosis not present

## 2020-06-17 DIAGNOSIS — M1990 Unspecified osteoarthritis, unspecified site: Secondary | ICD-10-CM | POA: Diagnosis not present

## 2020-06-17 DIAGNOSIS — B9562 Methicillin resistant Staphylococcus aureus infection as the cause of diseases classified elsewhere: Secondary | ICD-10-CM | POA: Diagnosis not present

## 2020-06-17 DIAGNOSIS — G894 Chronic pain syndrome: Secondary | ICD-10-CM | POA: Diagnosis not present

## 2020-06-17 DIAGNOSIS — D509 Iron deficiency anemia, unspecified: Secondary | ICD-10-CM | POA: Diagnosis not present

## 2020-06-17 DIAGNOSIS — K2100 Gastro-esophageal reflux disease with esophagitis, without bleeding: Secondary | ICD-10-CM | POA: Diagnosis not present

## 2020-06-17 DIAGNOSIS — S0003XD Contusion of scalp, subsequent encounter: Secondary | ICD-10-CM | POA: Diagnosis not present

## 2020-06-17 DIAGNOSIS — R296 Repeated falls: Secondary | ICD-10-CM | POA: Diagnosis not present

## 2020-06-17 DIAGNOSIS — R531 Weakness: Secondary | ICD-10-CM | POA: Diagnosis not present

## 2020-06-17 DIAGNOSIS — G8929 Other chronic pain: Secondary | ICD-10-CM | POA: Diagnosis not present

## 2020-06-17 DIAGNOSIS — R69 Illness, unspecified: Secondary | ICD-10-CM | POA: Diagnosis not present

## 2020-06-17 DIAGNOSIS — Z792 Long term (current) use of antibiotics: Secondary | ICD-10-CM | POA: Diagnosis not present

## 2020-06-17 DIAGNOSIS — S8011XA Contusion of right lower leg, initial encounter: Secondary | ICD-10-CM | POA: Diagnosis not present

## 2020-06-17 DIAGNOSIS — M6281 Muscle weakness (generalized): Secondary | ICD-10-CM | POA: Diagnosis not present

## 2020-06-17 DIAGNOSIS — S8012XA Contusion of left lower leg, initial encounter: Secondary | ICD-10-CM | POA: Diagnosis not present

## 2020-06-17 DIAGNOSIS — S0003XA Contusion of scalp, initial encounter: Secondary | ICD-10-CM | POA: Diagnosis not present

## 2020-06-17 DIAGNOSIS — R270 Ataxia, unspecified: Secondary | ICD-10-CM | POA: Diagnosis not present

## 2020-06-17 DIAGNOSIS — Z20822 Contact with and (suspected) exposure to covid-19: Secondary | ICD-10-CM | POA: Diagnosis not present

## 2020-06-17 DIAGNOSIS — Z880 Allergy status to penicillin: Secondary | ICD-10-CM | POA: Diagnosis not present

## 2020-06-17 DIAGNOSIS — L0231 Cutaneous abscess of buttock: Secondary | ICD-10-CM | POA: Diagnosis not present

## 2020-06-17 DIAGNOSIS — E876 Hypokalemia: Secondary | ICD-10-CM | POA: Diagnosis not present

## 2020-06-17 DIAGNOSIS — F17210 Nicotine dependence, cigarettes, uncomplicated: Secondary | ICD-10-CM | POA: Diagnosis not present

## 2020-06-17 DIAGNOSIS — B3781 Candidal esophagitis: Secondary | ICD-10-CM | POA: Diagnosis not present

## 2020-06-17 DIAGNOSIS — S199XXA Unspecified injury of neck, initial encounter: Secondary | ICD-10-CM | POA: Diagnosis not present

## 2020-06-17 DIAGNOSIS — K219 Gastro-esophageal reflux disease without esophagitis: Secondary | ICD-10-CM | POA: Diagnosis not present

## 2020-06-17 DIAGNOSIS — Z91040 Latex allergy status: Secondary | ICD-10-CM | POA: Diagnosis not present

## 2020-06-17 DIAGNOSIS — Z743 Need for continuous supervision: Secondary | ICD-10-CM | POA: Diagnosis not present

## 2020-06-17 DIAGNOSIS — Z9049 Acquired absence of other specified parts of digestive tract: Secondary | ICD-10-CM | POA: Diagnosis not present

## 2020-06-17 DIAGNOSIS — M24562 Contracture, left knee: Secondary | ICD-10-CM | POA: Diagnosis not present

## 2020-06-17 DIAGNOSIS — S0083XA Contusion of other part of head, initial encounter: Secondary | ICD-10-CM | POA: Diagnosis not present

## 2020-06-17 DIAGNOSIS — W19XXXA Unspecified fall, initial encounter: Secondary | ICD-10-CM | POA: Diagnosis not present

## 2020-06-17 DIAGNOSIS — I10 Essential (primary) hypertension: Secondary | ICD-10-CM | POA: Diagnosis not present

## 2020-06-17 DIAGNOSIS — L03317 Cellulitis of buttock: Secondary | ICD-10-CM | POA: Diagnosis not present

## 2020-06-17 DIAGNOSIS — R1312 Dysphagia, oropharyngeal phase: Secondary | ICD-10-CM | POA: Diagnosis not present

## 2020-06-28 DIAGNOSIS — Z9049 Acquired absence of other specified parts of digestive tract: Secondary | ICD-10-CM | POA: Diagnosis not present

## 2020-06-28 DIAGNOSIS — R2689 Other abnormalities of gait and mobility: Secondary | ICD-10-CM | POA: Diagnosis not present

## 2020-06-28 DIAGNOSIS — S8011XA Contusion of right lower leg, initial encounter: Secondary | ICD-10-CM | POA: Diagnosis not present

## 2020-06-28 DIAGNOSIS — Z91040 Latex allergy status: Secondary | ICD-10-CM | POA: Diagnosis not present

## 2020-06-28 DIAGNOSIS — L03317 Cellulitis of buttock: Secondary | ICD-10-CM | POA: Diagnosis not present

## 2020-06-28 DIAGNOSIS — Z7401 Bed confinement status: Secondary | ICD-10-CM | POA: Diagnosis not present

## 2020-06-28 DIAGNOSIS — L0231 Cutaneous abscess of buttock: Secondary | ICD-10-CM | POA: Diagnosis not present

## 2020-06-28 DIAGNOSIS — S0003XD Contusion of scalp, subsequent encounter: Secondary | ICD-10-CM | POA: Diagnosis not present

## 2020-06-28 DIAGNOSIS — R270 Ataxia, unspecified: Secondary | ICD-10-CM | POA: Diagnosis not present

## 2020-06-28 DIAGNOSIS — F17210 Nicotine dependence, cigarettes, uncomplicated: Secondary | ICD-10-CM | POA: Diagnosis not present

## 2020-06-28 DIAGNOSIS — G894 Chronic pain syndrome: Secondary | ICD-10-CM | POA: Diagnosis not present

## 2020-06-28 DIAGNOSIS — Z880 Allergy status to penicillin: Secondary | ICD-10-CM | POA: Diagnosis not present

## 2020-06-28 DIAGNOSIS — S8012XA Contusion of left lower leg, initial encounter: Secondary | ICD-10-CM | POA: Diagnosis not present

## 2020-06-28 DIAGNOSIS — K5909 Other constipation: Secondary | ICD-10-CM | POA: Diagnosis not present

## 2020-06-28 DIAGNOSIS — R69 Illness, unspecified: Secondary | ICD-10-CM | POA: Diagnosis not present

## 2020-06-28 DIAGNOSIS — D509 Iron deficiency anemia, unspecified: Secondary | ICD-10-CM | POA: Diagnosis not present

## 2020-06-28 DIAGNOSIS — R296 Repeated falls: Secondary | ICD-10-CM | POA: Diagnosis not present

## 2020-06-28 DIAGNOSIS — M24562 Contracture, left knee: Secondary | ICD-10-CM | POA: Diagnosis not present

## 2020-06-28 DIAGNOSIS — G8929 Other chronic pain: Secondary | ICD-10-CM | POA: Diagnosis not present

## 2020-06-28 DIAGNOSIS — Z792 Long term (current) use of antibiotics: Secondary | ICD-10-CM | POA: Diagnosis not present

## 2020-06-28 DIAGNOSIS — S0083XA Contusion of other part of head, initial encounter: Secondary | ICD-10-CM | POA: Diagnosis not present

## 2020-06-28 DIAGNOSIS — S0003XA Contusion of scalp, initial encounter: Secondary | ICD-10-CM | POA: Diagnosis not present

## 2020-06-28 DIAGNOSIS — Z743 Need for continuous supervision: Secondary | ICD-10-CM | POA: Diagnosis not present

## 2020-06-28 DIAGNOSIS — M6281 Muscle weakness (generalized): Secondary | ICD-10-CM | POA: Diagnosis not present

## 2020-06-28 DIAGNOSIS — I10 Essential (primary) hypertension: Secondary | ICD-10-CM | POA: Diagnosis not present

## 2020-06-28 DIAGNOSIS — W19XXXA Unspecified fall, initial encounter: Secondary | ICD-10-CM | POA: Diagnosis not present

## 2020-06-28 DIAGNOSIS — K219 Gastro-esophageal reflux disease without esophagitis: Secondary | ICD-10-CM | POA: Diagnosis not present

## 2020-06-28 DIAGNOSIS — R1312 Dysphagia, oropharyngeal phase: Secondary | ICD-10-CM | POA: Diagnosis not present

## 2020-06-28 DIAGNOSIS — M1990 Unspecified osteoarthritis, unspecified site: Secondary | ICD-10-CM | POA: Diagnosis not present

## 2020-06-28 DIAGNOSIS — E876 Hypokalemia: Secondary | ICD-10-CM | POA: Diagnosis not present

## 2020-06-28 DIAGNOSIS — B3781 Candidal esophagitis: Secondary | ICD-10-CM | POA: Diagnosis not present

## 2020-07-02 DIAGNOSIS — D509 Iron deficiency anemia, unspecified: Secondary | ICD-10-CM | POA: Diagnosis not present

## 2020-07-02 DIAGNOSIS — R270 Ataxia, unspecified: Secondary | ICD-10-CM | POA: Diagnosis not present

## 2020-07-02 DIAGNOSIS — I10 Essential (primary) hypertension: Secondary | ICD-10-CM | POA: Diagnosis not present

## 2020-07-07 DIAGNOSIS — M6281 Muscle weakness (generalized): Secondary | ICD-10-CM | POA: Diagnosis not present

## 2020-07-07 DIAGNOSIS — R270 Ataxia, unspecified: Secondary | ICD-10-CM | POA: Diagnosis not present

## 2020-07-07 DIAGNOSIS — D509 Iron deficiency anemia, unspecified: Secondary | ICD-10-CM | POA: Diagnosis not present

## 2020-07-08 DIAGNOSIS — R270 Ataxia, unspecified: Secondary | ICD-10-CM | POA: Diagnosis not present

## 2020-07-08 DIAGNOSIS — M6281 Muscle weakness (generalized): Secondary | ICD-10-CM | POA: Diagnosis not present

## 2020-07-08 DIAGNOSIS — D509 Iron deficiency anemia, unspecified: Secondary | ICD-10-CM | POA: Diagnosis not present

## 2020-07-10 DIAGNOSIS — E876 Hypokalemia: Secondary | ICD-10-CM | POA: Diagnosis not present

## 2020-07-11 DIAGNOSIS — E876 Hypokalemia: Secondary | ICD-10-CM | POA: Diagnosis not present

## 2020-07-18 DIAGNOSIS — E876 Hypokalemia: Secondary | ICD-10-CM | POA: Diagnosis not present

## 2020-07-19 DIAGNOSIS — R270 Ataxia, unspecified: Secondary | ICD-10-CM | POA: Diagnosis not present

## 2020-07-21 DIAGNOSIS — R270 Ataxia, unspecified: Secondary | ICD-10-CM | POA: Diagnosis not present

## 2020-07-25 DIAGNOSIS — E876 Hypokalemia: Secondary | ICD-10-CM | POA: Diagnosis not present

## 2020-07-25 DIAGNOSIS — R270 Ataxia, unspecified: Secondary | ICD-10-CM | POA: Diagnosis not present

## 2020-07-25 DIAGNOSIS — M6281 Muscle weakness (generalized): Secondary | ICD-10-CM | POA: Diagnosis not present

## 2020-07-25 DIAGNOSIS — R2689 Other abnormalities of gait and mobility: Secondary | ICD-10-CM | POA: Diagnosis not present

## 2020-07-25 DIAGNOSIS — R1312 Dysphagia, oropharyngeal phase: Secondary | ICD-10-CM | POA: Diagnosis not present

## 2020-07-26 DIAGNOSIS — M6281 Muscle weakness (generalized): Secondary | ICD-10-CM | POA: Diagnosis not present

## 2020-07-26 DIAGNOSIS — R270 Ataxia, unspecified: Secondary | ICD-10-CM | POA: Diagnosis not present

## 2020-07-26 DIAGNOSIS — R1312 Dysphagia, oropharyngeal phase: Secondary | ICD-10-CM | POA: Diagnosis not present

## 2020-07-26 DIAGNOSIS — Z20822 Contact with and (suspected) exposure to covid-19: Secondary | ICD-10-CM | POA: Diagnosis not present

## 2020-07-26 DIAGNOSIS — R2689 Other abnormalities of gait and mobility: Secondary | ICD-10-CM | POA: Diagnosis not present

## 2020-07-27 DIAGNOSIS — R270 Ataxia, unspecified: Secondary | ICD-10-CM | POA: Diagnosis not present

## 2020-07-27 DIAGNOSIS — R1312 Dysphagia, oropharyngeal phase: Secondary | ICD-10-CM | POA: Diagnosis not present

## 2020-07-27 DIAGNOSIS — R2689 Other abnormalities of gait and mobility: Secondary | ICD-10-CM | POA: Diagnosis not present

## 2020-07-27 DIAGNOSIS — M6281 Muscle weakness (generalized): Secondary | ICD-10-CM | POA: Diagnosis not present

## 2020-07-28 DIAGNOSIS — I10 Essential (primary) hypertension: Secondary | ICD-10-CM | POA: Diagnosis not present

## 2020-07-29 DIAGNOSIS — F17210 Nicotine dependence, cigarettes, uncomplicated: Secondary | ICD-10-CM | POA: Diagnosis not present

## 2020-07-29 DIAGNOSIS — M6281 Muscle weakness (generalized): Secondary | ICD-10-CM | POA: Diagnosis not present

## 2020-07-29 DIAGNOSIS — I10 Essential (primary) hypertension: Secondary | ICD-10-CM | POA: Diagnosis not present

## 2020-07-29 DIAGNOSIS — R296 Repeated falls: Secondary | ICD-10-CM | POA: Diagnosis not present

## 2020-07-29 DIAGNOSIS — G894 Chronic pain syndrome: Secondary | ICD-10-CM | POA: Diagnosis not present

## 2020-07-29 DIAGNOSIS — R270 Ataxia, unspecified: Secondary | ICD-10-CM | POA: Diagnosis not present

## 2020-07-29 DIAGNOSIS — R1312 Dysphagia, oropharyngeal phase: Secondary | ICD-10-CM | POA: Diagnosis not present

## 2020-07-29 DIAGNOSIS — S0003XD Contusion of scalp, subsequent encounter: Secondary | ICD-10-CM | POA: Diagnosis not present

## 2020-08-01 DIAGNOSIS — F17210 Nicotine dependence, cigarettes, uncomplicated: Secondary | ICD-10-CM | POA: Diagnosis not present

## 2020-08-01 DIAGNOSIS — R296 Repeated falls: Secondary | ICD-10-CM | POA: Diagnosis not present

## 2020-08-01 DIAGNOSIS — S0003XD Contusion of scalp, subsequent encounter: Secondary | ICD-10-CM | POA: Diagnosis not present

## 2020-08-01 DIAGNOSIS — M6281 Muscle weakness (generalized): Secondary | ICD-10-CM | POA: Diagnosis not present

## 2020-08-01 DIAGNOSIS — G894 Chronic pain syndrome: Secondary | ICD-10-CM | POA: Diagnosis not present

## 2020-08-01 DIAGNOSIS — R1312 Dysphagia, oropharyngeal phase: Secondary | ICD-10-CM | POA: Diagnosis not present

## 2020-08-01 DIAGNOSIS — R270 Ataxia, unspecified: Secondary | ICD-10-CM | POA: Diagnosis not present

## 2020-08-01 DIAGNOSIS — I10 Essential (primary) hypertension: Secondary | ICD-10-CM | POA: Diagnosis not present

## 2020-08-03 DIAGNOSIS — R1312 Dysphagia, oropharyngeal phase: Secondary | ICD-10-CM | POA: Diagnosis not present

## 2020-08-03 DIAGNOSIS — R296 Repeated falls: Secondary | ICD-10-CM | POA: Diagnosis not present

## 2020-08-03 DIAGNOSIS — M6281 Muscle weakness (generalized): Secondary | ICD-10-CM | POA: Diagnosis not present

## 2020-08-03 DIAGNOSIS — R270 Ataxia, unspecified: Secondary | ICD-10-CM | POA: Diagnosis not present

## 2020-08-03 DIAGNOSIS — G894 Chronic pain syndrome: Secondary | ICD-10-CM | POA: Diagnosis not present

## 2020-08-03 DIAGNOSIS — S0003XD Contusion of scalp, subsequent encounter: Secondary | ICD-10-CM | POA: Diagnosis not present

## 2020-08-03 DIAGNOSIS — I10 Essential (primary) hypertension: Secondary | ICD-10-CM | POA: Diagnosis not present

## 2020-08-03 DIAGNOSIS — F17210 Nicotine dependence, cigarettes, uncomplicated: Secondary | ICD-10-CM | POA: Diagnosis not present

## 2020-08-04 DIAGNOSIS — S0003XD Contusion of scalp, subsequent encounter: Secondary | ICD-10-CM | POA: Diagnosis not present

## 2020-08-04 DIAGNOSIS — M6281 Muscle weakness (generalized): Secondary | ICD-10-CM | POA: Diagnosis not present

## 2020-08-04 DIAGNOSIS — G894 Chronic pain syndrome: Secondary | ICD-10-CM | POA: Diagnosis not present

## 2020-08-04 DIAGNOSIS — F17210 Nicotine dependence, cigarettes, uncomplicated: Secondary | ICD-10-CM | POA: Diagnosis not present

## 2020-08-04 DIAGNOSIS — R270 Ataxia, unspecified: Secondary | ICD-10-CM | POA: Diagnosis not present

## 2020-08-04 DIAGNOSIS — R296 Repeated falls: Secondary | ICD-10-CM | POA: Diagnosis not present

## 2020-08-04 DIAGNOSIS — R1312 Dysphagia, oropharyngeal phase: Secondary | ICD-10-CM | POA: Diagnosis not present

## 2020-08-04 DIAGNOSIS — I10 Essential (primary) hypertension: Secondary | ICD-10-CM | POA: Diagnosis not present

## 2020-08-09 DIAGNOSIS — G894 Chronic pain syndrome: Secondary | ICD-10-CM | POA: Diagnosis not present

## 2020-08-09 DIAGNOSIS — R270 Ataxia, unspecified: Secondary | ICD-10-CM | POA: Diagnosis not present

## 2020-08-09 DIAGNOSIS — I10 Essential (primary) hypertension: Secondary | ICD-10-CM | POA: Diagnosis not present

## 2020-08-09 DIAGNOSIS — S0003XD Contusion of scalp, subsequent encounter: Secondary | ICD-10-CM | POA: Diagnosis not present

## 2020-08-09 DIAGNOSIS — R296 Repeated falls: Secondary | ICD-10-CM | POA: Diagnosis not present

## 2020-08-09 DIAGNOSIS — F17210 Nicotine dependence, cigarettes, uncomplicated: Secondary | ICD-10-CM | POA: Diagnosis not present

## 2020-08-09 DIAGNOSIS — M6281 Muscle weakness (generalized): Secondary | ICD-10-CM | POA: Diagnosis not present

## 2020-08-09 DIAGNOSIS — R1312 Dysphagia, oropharyngeal phase: Secondary | ICD-10-CM | POA: Diagnosis not present

## 2020-08-10 DIAGNOSIS — I10 Essential (primary) hypertension: Secondary | ICD-10-CM | POA: Diagnosis not present

## 2020-08-10 DIAGNOSIS — E876 Hypokalemia: Secondary | ICD-10-CM | POA: Diagnosis not present

## 2020-08-10 DIAGNOSIS — Z6820 Body mass index (BMI) 20.0-20.9, adult: Secondary | ICD-10-CM | POA: Diagnosis not present

## 2020-08-10 DIAGNOSIS — R270 Ataxia, unspecified: Secondary | ICD-10-CM | POA: Diagnosis not present

## 2020-08-11 DIAGNOSIS — R296 Repeated falls: Secondary | ICD-10-CM | POA: Diagnosis not present

## 2020-08-11 DIAGNOSIS — R270 Ataxia, unspecified: Secondary | ICD-10-CM | POA: Diagnosis not present

## 2020-08-11 DIAGNOSIS — I10 Essential (primary) hypertension: Secondary | ICD-10-CM | POA: Diagnosis not present

## 2020-08-11 DIAGNOSIS — F17210 Nicotine dependence, cigarettes, uncomplicated: Secondary | ICD-10-CM | POA: Diagnosis not present

## 2020-08-11 DIAGNOSIS — R1312 Dysphagia, oropharyngeal phase: Secondary | ICD-10-CM | POA: Diagnosis not present

## 2020-08-11 DIAGNOSIS — G894 Chronic pain syndrome: Secondary | ICD-10-CM | POA: Diagnosis not present

## 2020-08-11 DIAGNOSIS — S0003XD Contusion of scalp, subsequent encounter: Secondary | ICD-10-CM | POA: Diagnosis not present

## 2020-08-11 DIAGNOSIS — M6281 Muscle weakness (generalized): Secondary | ICD-10-CM | POA: Diagnosis not present

## 2020-08-12 DIAGNOSIS — M6281 Muscle weakness (generalized): Secondary | ICD-10-CM | POA: Diagnosis not present

## 2020-08-12 DIAGNOSIS — R270 Ataxia, unspecified: Secondary | ICD-10-CM | POA: Diagnosis not present

## 2020-08-12 DIAGNOSIS — S0003XD Contusion of scalp, subsequent encounter: Secondary | ICD-10-CM | POA: Diagnosis not present

## 2020-08-12 DIAGNOSIS — F17210 Nicotine dependence, cigarettes, uncomplicated: Secondary | ICD-10-CM | POA: Diagnosis not present

## 2020-08-12 DIAGNOSIS — R296 Repeated falls: Secondary | ICD-10-CM | POA: Diagnosis not present

## 2020-08-12 DIAGNOSIS — G894 Chronic pain syndrome: Secondary | ICD-10-CM | POA: Diagnosis not present

## 2020-08-12 DIAGNOSIS — R1312 Dysphagia, oropharyngeal phase: Secondary | ICD-10-CM | POA: Diagnosis not present

## 2020-08-12 DIAGNOSIS — I10 Essential (primary) hypertension: Secondary | ICD-10-CM | POA: Diagnosis not present

## 2020-08-15 DIAGNOSIS — R1312 Dysphagia, oropharyngeal phase: Secondary | ICD-10-CM | POA: Diagnosis not present

## 2020-08-15 DIAGNOSIS — S0003XD Contusion of scalp, subsequent encounter: Secondary | ICD-10-CM | POA: Diagnosis not present

## 2020-08-15 DIAGNOSIS — R270 Ataxia, unspecified: Secondary | ICD-10-CM | POA: Diagnosis not present

## 2020-08-15 DIAGNOSIS — F17210 Nicotine dependence, cigarettes, uncomplicated: Secondary | ICD-10-CM | POA: Diagnosis not present

## 2020-08-15 DIAGNOSIS — I10 Essential (primary) hypertension: Secondary | ICD-10-CM | POA: Diagnosis not present

## 2020-08-15 DIAGNOSIS — R296 Repeated falls: Secondary | ICD-10-CM | POA: Diagnosis not present

## 2020-08-15 DIAGNOSIS — G894 Chronic pain syndrome: Secondary | ICD-10-CM | POA: Diagnosis not present

## 2020-08-15 DIAGNOSIS — M6281 Muscle weakness (generalized): Secondary | ICD-10-CM | POA: Diagnosis not present

## 2020-08-16 DIAGNOSIS — R1312 Dysphagia, oropharyngeal phase: Secondary | ICD-10-CM | POA: Diagnosis not present

## 2020-08-16 DIAGNOSIS — S0003XD Contusion of scalp, subsequent encounter: Secondary | ICD-10-CM | POA: Diagnosis not present

## 2020-08-16 DIAGNOSIS — R270 Ataxia, unspecified: Secondary | ICD-10-CM | POA: Diagnosis not present

## 2020-08-16 DIAGNOSIS — G894 Chronic pain syndrome: Secondary | ICD-10-CM | POA: Diagnosis not present

## 2020-08-16 DIAGNOSIS — R296 Repeated falls: Secondary | ICD-10-CM | POA: Diagnosis not present

## 2020-08-16 DIAGNOSIS — M6281 Muscle weakness (generalized): Secondary | ICD-10-CM | POA: Diagnosis not present

## 2020-08-16 DIAGNOSIS — I10 Essential (primary) hypertension: Secondary | ICD-10-CM | POA: Diagnosis not present

## 2020-08-16 DIAGNOSIS — F17210 Nicotine dependence, cigarettes, uncomplicated: Secondary | ICD-10-CM | POA: Diagnosis not present

## 2020-08-23 DIAGNOSIS — R296 Repeated falls: Secondary | ICD-10-CM | POA: Diagnosis not present

## 2020-08-23 DIAGNOSIS — M6281 Muscle weakness (generalized): Secondary | ICD-10-CM | POA: Diagnosis not present

## 2020-08-23 DIAGNOSIS — I10 Essential (primary) hypertension: Secondary | ICD-10-CM | POA: Diagnosis not present

## 2020-08-23 DIAGNOSIS — F17210 Nicotine dependence, cigarettes, uncomplicated: Secondary | ICD-10-CM | POA: Diagnosis not present

## 2020-08-23 DIAGNOSIS — E7849 Other hyperlipidemia: Secondary | ICD-10-CM | POA: Diagnosis not present

## 2020-08-23 DIAGNOSIS — R1312 Dysphagia, oropharyngeal phase: Secondary | ICD-10-CM | POA: Diagnosis not present

## 2020-08-23 DIAGNOSIS — J449 Chronic obstructive pulmonary disease, unspecified: Secondary | ICD-10-CM | POA: Diagnosis not present

## 2020-08-23 DIAGNOSIS — R270 Ataxia, unspecified: Secondary | ICD-10-CM | POA: Diagnosis not present

## 2020-08-23 DIAGNOSIS — Z720 Tobacco use: Secondary | ICD-10-CM | POA: Diagnosis not present

## 2020-08-23 DIAGNOSIS — S0003XD Contusion of scalp, subsequent encounter: Secondary | ICD-10-CM | POA: Diagnosis not present

## 2020-08-23 DIAGNOSIS — G894 Chronic pain syndrome: Secondary | ICD-10-CM | POA: Diagnosis not present

## 2020-08-24 DIAGNOSIS — G894 Chronic pain syndrome: Secondary | ICD-10-CM | POA: Diagnosis not present

## 2020-08-24 DIAGNOSIS — S0003XD Contusion of scalp, subsequent encounter: Secondary | ICD-10-CM | POA: Diagnosis not present

## 2020-08-24 DIAGNOSIS — R270 Ataxia, unspecified: Secondary | ICD-10-CM | POA: Diagnosis not present

## 2020-08-24 DIAGNOSIS — F17210 Nicotine dependence, cigarettes, uncomplicated: Secondary | ICD-10-CM | POA: Diagnosis not present

## 2020-08-24 DIAGNOSIS — I10 Essential (primary) hypertension: Secondary | ICD-10-CM | POA: Diagnosis not present

## 2020-08-24 DIAGNOSIS — R1312 Dysphagia, oropharyngeal phase: Secondary | ICD-10-CM | POA: Diagnosis not present

## 2020-08-24 DIAGNOSIS — R296 Repeated falls: Secondary | ICD-10-CM | POA: Diagnosis not present

## 2020-08-24 DIAGNOSIS — M6281 Muscle weakness (generalized): Secondary | ICD-10-CM | POA: Diagnosis not present

## 2020-08-25 DIAGNOSIS — M6281 Muscle weakness (generalized): Secondary | ICD-10-CM | POA: Diagnosis not present

## 2020-08-25 DIAGNOSIS — R296 Repeated falls: Secondary | ICD-10-CM | POA: Diagnosis not present

## 2020-08-25 DIAGNOSIS — S0003XD Contusion of scalp, subsequent encounter: Secondary | ICD-10-CM | POA: Diagnosis not present

## 2020-08-25 DIAGNOSIS — I10 Essential (primary) hypertension: Secondary | ICD-10-CM | POA: Diagnosis not present

## 2020-08-25 DIAGNOSIS — R270 Ataxia, unspecified: Secondary | ICD-10-CM | POA: Diagnosis not present

## 2020-08-25 DIAGNOSIS — G894 Chronic pain syndrome: Secondary | ICD-10-CM | POA: Diagnosis not present

## 2020-08-25 DIAGNOSIS — F17210 Nicotine dependence, cigarettes, uncomplicated: Secondary | ICD-10-CM | POA: Diagnosis not present

## 2020-08-25 DIAGNOSIS — R1312 Dysphagia, oropharyngeal phase: Secondary | ICD-10-CM | POA: Diagnosis not present

## 2020-08-27 DIAGNOSIS — R270 Ataxia, unspecified: Secondary | ICD-10-CM | POA: Diagnosis not present

## 2020-08-27 DIAGNOSIS — E876 Hypokalemia: Secondary | ICD-10-CM | POA: Diagnosis not present

## 2020-08-27 DIAGNOSIS — Z6820 Body mass index (BMI) 20.0-20.9, adult: Secondary | ICD-10-CM | POA: Diagnosis not present

## 2020-08-27 DIAGNOSIS — I10 Essential (primary) hypertension: Secondary | ICD-10-CM | POA: Diagnosis not present

## 2020-08-30 DIAGNOSIS — R296 Repeated falls: Secondary | ICD-10-CM | POA: Diagnosis not present

## 2020-08-30 DIAGNOSIS — M6281 Muscle weakness (generalized): Secondary | ICD-10-CM | POA: Diagnosis not present

## 2020-08-30 DIAGNOSIS — R1312 Dysphagia, oropharyngeal phase: Secondary | ICD-10-CM | POA: Diagnosis not present

## 2020-08-30 DIAGNOSIS — S0003XD Contusion of scalp, subsequent encounter: Secondary | ICD-10-CM | POA: Diagnosis not present

## 2020-08-30 DIAGNOSIS — R270 Ataxia, unspecified: Secondary | ICD-10-CM | POA: Diagnosis not present

## 2020-08-30 DIAGNOSIS — I10 Essential (primary) hypertension: Secondary | ICD-10-CM | POA: Diagnosis not present

## 2020-08-30 DIAGNOSIS — G894 Chronic pain syndrome: Secondary | ICD-10-CM | POA: Diagnosis not present

## 2020-08-30 DIAGNOSIS — F17210 Nicotine dependence, cigarettes, uncomplicated: Secondary | ICD-10-CM | POA: Diagnosis not present

## 2020-09-01 DIAGNOSIS — R1312 Dysphagia, oropharyngeal phase: Secondary | ICD-10-CM | POA: Diagnosis not present

## 2020-09-01 DIAGNOSIS — M6281 Muscle weakness (generalized): Secondary | ICD-10-CM | POA: Diagnosis not present

## 2020-09-01 DIAGNOSIS — R296 Repeated falls: Secondary | ICD-10-CM | POA: Diagnosis not present

## 2020-09-01 DIAGNOSIS — I10 Essential (primary) hypertension: Secondary | ICD-10-CM | POA: Diagnosis not present

## 2020-09-01 DIAGNOSIS — R270 Ataxia, unspecified: Secondary | ICD-10-CM | POA: Diagnosis not present

## 2020-09-01 DIAGNOSIS — G894 Chronic pain syndrome: Secondary | ICD-10-CM | POA: Diagnosis not present

## 2020-09-01 DIAGNOSIS — S0003XD Contusion of scalp, subsequent encounter: Secondary | ICD-10-CM | POA: Diagnosis not present

## 2020-09-01 DIAGNOSIS — F17210 Nicotine dependence, cigarettes, uncomplicated: Secondary | ICD-10-CM | POA: Diagnosis not present

## 2020-09-05 DIAGNOSIS — F17210 Nicotine dependence, cigarettes, uncomplicated: Secondary | ICD-10-CM | POA: Diagnosis not present

## 2020-09-05 DIAGNOSIS — I10 Essential (primary) hypertension: Secondary | ICD-10-CM | POA: Diagnosis not present

## 2020-09-05 DIAGNOSIS — R270 Ataxia, unspecified: Secondary | ICD-10-CM | POA: Diagnosis not present

## 2020-09-05 DIAGNOSIS — R296 Repeated falls: Secondary | ICD-10-CM | POA: Diagnosis not present

## 2020-09-05 DIAGNOSIS — S0003XD Contusion of scalp, subsequent encounter: Secondary | ICD-10-CM | POA: Diagnosis not present

## 2020-09-05 DIAGNOSIS — R1312 Dysphagia, oropharyngeal phase: Secondary | ICD-10-CM | POA: Diagnosis not present

## 2020-09-05 DIAGNOSIS — M6281 Muscle weakness (generalized): Secondary | ICD-10-CM | POA: Diagnosis not present

## 2020-09-05 DIAGNOSIS — G894 Chronic pain syndrome: Secondary | ICD-10-CM | POA: Diagnosis not present

## 2020-09-09 DIAGNOSIS — L22 Diaper dermatitis: Secondary | ICD-10-CM | POA: Diagnosis not present

## 2020-09-09 DIAGNOSIS — Z6820 Body mass index (BMI) 20.0-20.9, adult: Secondary | ICD-10-CM | POA: Diagnosis not present

## 2020-09-09 DIAGNOSIS — B372 Candidiasis of skin and nail: Secondary | ICD-10-CM | POA: Diagnosis not present

## 2020-09-09 DIAGNOSIS — R300 Dysuria: Secondary | ICD-10-CM | POA: Diagnosis not present

## 2020-09-14 DIAGNOSIS — R296 Repeated falls: Secondary | ICD-10-CM | POA: Diagnosis not present

## 2020-09-14 DIAGNOSIS — I10 Essential (primary) hypertension: Secondary | ICD-10-CM | POA: Diagnosis not present

## 2020-09-14 DIAGNOSIS — M170 Bilateral primary osteoarthritis of knee: Secondary | ICD-10-CM | POA: Diagnosis not present

## 2020-09-14 DIAGNOSIS — Z23 Encounter for immunization: Secondary | ICD-10-CM | POA: Diagnosis not present

## 2020-09-14 DIAGNOSIS — Z6821 Body mass index (BMI) 21.0-21.9, adult: Secondary | ICD-10-CM | POA: Diagnosis not present

## 2020-09-14 DIAGNOSIS — F17210 Nicotine dependence, cigarettes, uncomplicated: Secondary | ICD-10-CM | POA: Diagnosis not present

## 2020-09-14 DIAGNOSIS — R270 Ataxia, unspecified: Secondary | ICD-10-CM | POA: Diagnosis not present

## 2020-09-14 DIAGNOSIS — S0003XD Contusion of scalp, subsequent encounter: Secondary | ICD-10-CM | POA: Diagnosis not present

## 2020-09-14 DIAGNOSIS — G894 Chronic pain syndrome: Secondary | ICD-10-CM | POA: Diagnosis not present

## 2020-09-14 DIAGNOSIS — R1312 Dysphagia, oropharyngeal phase: Secondary | ICD-10-CM | POA: Diagnosis not present

## 2020-09-14 DIAGNOSIS — M6281 Muscle weakness (generalized): Secondary | ICD-10-CM | POA: Diagnosis not present

## 2020-09-15 DIAGNOSIS — R270 Ataxia, unspecified: Secondary | ICD-10-CM | POA: Diagnosis not present

## 2020-09-15 DIAGNOSIS — I10 Essential (primary) hypertension: Secondary | ICD-10-CM | POA: Diagnosis not present

## 2020-09-15 DIAGNOSIS — M6281 Muscle weakness (generalized): Secondary | ICD-10-CM | POA: Diagnosis not present

## 2020-09-15 DIAGNOSIS — G894 Chronic pain syndrome: Secondary | ICD-10-CM | POA: Diagnosis not present

## 2020-09-15 DIAGNOSIS — S0003XD Contusion of scalp, subsequent encounter: Secondary | ICD-10-CM | POA: Diagnosis not present

## 2020-09-15 DIAGNOSIS — R1312 Dysphagia, oropharyngeal phase: Secondary | ICD-10-CM | POA: Diagnosis not present

## 2020-09-15 DIAGNOSIS — F17210 Nicotine dependence, cigarettes, uncomplicated: Secondary | ICD-10-CM | POA: Diagnosis not present

## 2020-09-15 DIAGNOSIS — R296 Repeated falls: Secondary | ICD-10-CM | POA: Diagnosis not present

## 2020-09-20 DIAGNOSIS — F17210 Nicotine dependence, cigarettes, uncomplicated: Secondary | ICD-10-CM | POA: Diagnosis not present

## 2020-09-20 DIAGNOSIS — R296 Repeated falls: Secondary | ICD-10-CM | POA: Diagnosis not present

## 2020-09-20 DIAGNOSIS — I10 Essential (primary) hypertension: Secondary | ICD-10-CM | POA: Diagnosis not present

## 2020-09-20 DIAGNOSIS — R270 Ataxia, unspecified: Secondary | ICD-10-CM | POA: Diagnosis not present

## 2020-09-20 DIAGNOSIS — G894 Chronic pain syndrome: Secondary | ICD-10-CM | POA: Diagnosis not present

## 2020-09-20 DIAGNOSIS — M6281 Muscle weakness (generalized): Secondary | ICD-10-CM | POA: Diagnosis not present

## 2020-09-20 DIAGNOSIS — R1312 Dysphagia, oropharyngeal phase: Secondary | ICD-10-CM | POA: Diagnosis not present

## 2020-09-20 DIAGNOSIS — S0003XD Contusion of scalp, subsequent encounter: Secondary | ICD-10-CM | POA: Diagnosis not present

## 2020-09-22 DIAGNOSIS — S0003XD Contusion of scalp, subsequent encounter: Secondary | ICD-10-CM | POA: Diagnosis not present

## 2020-09-22 DIAGNOSIS — F17210 Nicotine dependence, cigarettes, uncomplicated: Secondary | ICD-10-CM | POA: Diagnosis not present

## 2020-09-22 DIAGNOSIS — M6281 Muscle weakness (generalized): Secondary | ICD-10-CM | POA: Diagnosis not present

## 2020-09-22 DIAGNOSIS — R270 Ataxia, unspecified: Secondary | ICD-10-CM | POA: Diagnosis not present

## 2020-09-22 DIAGNOSIS — I10 Essential (primary) hypertension: Secondary | ICD-10-CM | POA: Diagnosis not present

## 2020-09-22 DIAGNOSIS — R1312 Dysphagia, oropharyngeal phase: Secondary | ICD-10-CM | POA: Diagnosis not present

## 2020-09-22 DIAGNOSIS — Z720 Tobacco use: Secondary | ICD-10-CM | POA: Diagnosis not present

## 2020-09-22 DIAGNOSIS — J449 Chronic obstructive pulmonary disease, unspecified: Secondary | ICD-10-CM | POA: Diagnosis not present

## 2020-09-22 DIAGNOSIS — E7849 Other hyperlipidemia: Secondary | ICD-10-CM | POA: Diagnosis not present

## 2020-09-22 DIAGNOSIS — G894 Chronic pain syndrome: Secondary | ICD-10-CM | POA: Diagnosis not present

## 2020-09-22 DIAGNOSIS — R296 Repeated falls: Secondary | ICD-10-CM | POA: Diagnosis not present

## 2020-10-03 DIAGNOSIS — S0003XD Contusion of scalp, subsequent encounter: Secondary | ICD-10-CM | POA: Diagnosis not present

## 2020-10-03 DIAGNOSIS — R270 Ataxia, unspecified: Secondary | ICD-10-CM | POA: Diagnosis not present

## 2020-10-03 DIAGNOSIS — M6281 Muscle weakness (generalized): Secondary | ICD-10-CM | POA: Diagnosis not present

## 2020-10-03 DIAGNOSIS — R296 Repeated falls: Secondary | ICD-10-CM | POA: Diagnosis not present

## 2020-10-03 DIAGNOSIS — G894 Chronic pain syndrome: Secondary | ICD-10-CM | POA: Diagnosis not present

## 2020-11-16 DIAGNOSIS — R739 Hyperglycemia, unspecified: Secondary | ICD-10-CM | POA: Diagnosis not present

## 2020-11-16 DIAGNOSIS — I10 Essential (primary) hypertension: Secondary | ICD-10-CM | POA: Diagnosis not present

## 2020-11-16 DIAGNOSIS — E782 Mixed hyperlipidemia: Secondary | ICD-10-CM | POA: Diagnosis not present

## 2020-11-16 DIAGNOSIS — E876 Hypokalemia: Secondary | ICD-10-CM | POA: Diagnosis not present

## 2020-11-16 DIAGNOSIS — K2100 Gastro-esophageal reflux disease with esophagitis, without bleeding: Secondary | ICD-10-CM | POA: Diagnosis not present

## 2020-11-23 DIAGNOSIS — Z23 Encounter for immunization: Secondary | ICD-10-CM | POA: Diagnosis not present

## 2020-11-23 DIAGNOSIS — Z0001 Encounter for general adult medical examination with abnormal findings: Secondary | ICD-10-CM | POA: Diagnosis not present

## 2020-11-23 DIAGNOSIS — E782 Mixed hyperlipidemia: Secondary | ICD-10-CM | POA: Diagnosis not present

## 2020-11-23 DIAGNOSIS — Z6822 Body mass index (BMI) 22.0-22.9, adult: Secondary | ICD-10-CM | POA: Diagnosis not present

## 2020-11-23 DIAGNOSIS — I10 Essential (primary) hypertension: Secondary | ICD-10-CM | POA: Diagnosis not present

## 2020-12-07 DIAGNOSIS — J449 Chronic obstructive pulmonary disease, unspecified: Secondary | ICD-10-CM | POA: Diagnosis not present

## 2020-12-07 DIAGNOSIS — E876 Hypokalemia: Secondary | ICD-10-CM | POA: Diagnosis not present

## 2020-12-07 DIAGNOSIS — R739 Hyperglycemia, unspecified: Secondary | ICD-10-CM | POA: Diagnosis not present

## 2020-12-23 DIAGNOSIS — E7849 Other hyperlipidemia: Secondary | ICD-10-CM | POA: Diagnosis not present

## 2020-12-23 DIAGNOSIS — J449 Chronic obstructive pulmonary disease, unspecified: Secondary | ICD-10-CM | POA: Diagnosis not present

## 2020-12-23 DIAGNOSIS — I10 Essential (primary) hypertension: Secondary | ICD-10-CM | POA: Diagnosis not present

## 2020-12-23 DIAGNOSIS — Z720 Tobacco use: Secondary | ICD-10-CM | POA: Diagnosis not present

## 2021-01-05 DIAGNOSIS — Z79891 Long term (current) use of opiate analgesic: Secondary | ICD-10-CM | POA: Diagnosis not present

## 2021-01-05 DIAGNOSIS — M1710 Unilateral primary osteoarthritis, unspecified knee: Secondary | ICD-10-CM | POA: Diagnosis not present

## 2021-01-21 DIAGNOSIS — Z720 Tobacco use: Secondary | ICD-10-CM | POA: Diagnosis not present

## 2021-01-21 DIAGNOSIS — E7849 Other hyperlipidemia: Secondary | ICD-10-CM | POA: Diagnosis not present

## 2021-01-21 DIAGNOSIS — J449 Chronic obstructive pulmonary disease, unspecified: Secondary | ICD-10-CM | POA: Diagnosis not present

## 2021-01-21 DIAGNOSIS — I10 Essential (primary) hypertension: Secondary | ICD-10-CM | POA: Diagnosis not present

## 2021-02-06 DIAGNOSIS — M47816 Spondylosis without myelopathy or radiculopathy, lumbar region: Secondary | ICD-10-CM | POA: Diagnosis not present

## 2021-02-20 DIAGNOSIS — I10 Essential (primary) hypertension: Secondary | ICD-10-CM | POA: Diagnosis not present

## 2021-02-20 DIAGNOSIS — Z720 Tobacco use: Secondary | ICD-10-CM | POA: Diagnosis not present

## 2021-02-20 DIAGNOSIS — E7849 Other hyperlipidemia: Secondary | ICD-10-CM | POA: Diagnosis not present

## 2021-02-20 DIAGNOSIS — J449 Chronic obstructive pulmonary disease, unspecified: Secondary | ICD-10-CM | POA: Diagnosis not present

## 2021-02-27 DIAGNOSIS — M47816 Spondylosis without myelopathy or radiculopathy, lumbar region: Secondary | ICD-10-CM | POA: Diagnosis not present

## 2021-03-22 DIAGNOSIS — M47816 Spondylosis without myelopathy or radiculopathy, lumbar region: Secondary | ICD-10-CM | POA: Diagnosis not present

## 2021-03-22 DIAGNOSIS — Z720 Tobacco use: Secondary | ICD-10-CM | POA: Diagnosis not present

## 2021-03-22 DIAGNOSIS — J449 Chronic obstructive pulmonary disease, unspecified: Secondary | ICD-10-CM | POA: Diagnosis not present

## 2021-03-22 DIAGNOSIS — E7849 Other hyperlipidemia: Secondary | ICD-10-CM | POA: Diagnosis not present

## 2021-03-22 DIAGNOSIS — I10 Essential (primary) hypertension: Secondary | ICD-10-CM | POA: Diagnosis not present

## 2021-04-12 DIAGNOSIS — Z79891 Long term (current) use of opiate analgesic: Secondary | ICD-10-CM | POA: Diagnosis not present

## 2021-04-17 DIAGNOSIS — E876 Hypokalemia: Secondary | ICD-10-CM | POA: Diagnosis not present

## 2021-04-17 DIAGNOSIS — E7849 Other hyperlipidemia: Secondary | ICD-10-CM | POA: Diagnosis not present

## 2021-04-17 DIAGNOSIS — E782 Mixed hyperlipidemia: Secondary | ICD-10-CM | POA: Diagnosis not present

## 2021-04-22 DIAGNOSIS — J449 Chronic obstructive pulmonary disease, unspecified: Secondary | ICD-10-CM | POA: Diagnosis not present

## 2021-04-22 DIAGNOSIS — Z720 Tobacco use: Secondary | ICD-10-CM | POA: Diagnosis not present

## 2021-04-22 DIAGNOSIS — I10 Essential (primary) hypertension: Secondary | ICD-10-CM | POA: Diagnosis not present

## 2021-04-22 DIAGNOSIS — E7849 Other hyperlipidemia: Secondary | ICD-10-CM | POA: Diagnosis not present

## 2021-04-25 DIAGNOSIS — E7849 Other hyperlipidemia: Secondary | ICD-10-CM | POA: Diagnosis not present

## 2021-04-25 DIAGNOSIS — Z7189 Other specified counseling: Secondary | ICD-10-CM | POA: Diagnosis not present

## 2021-04-25 DIAGNOSIS — I10 Essential (primary) hypertension: Secondary | ICD-10-CM | POA: Diagnosis not present

## 2021-04-25 DIAGNOSIS — G5603 Carpal tunnel syndrome, bilateral upper limbs: Secondary | ICD-10-CM | POA: Diagnosis not present

## 2021-04-25 DIAGNOSIS — Z23 Encounter for immunization: Secondary | ICD-10-CM | POA: Diagnosis not present

## 2021-04-25 DIAGNOSIS — J449 Chronic obstructive pulmonary disease, unspecified: Secondary | ICD-10-CM | POA: Diagnosis not present

## 2021-04-25 DIAGNOSIS — Z1389 Encounter for screening for other disorder: Secondary | ICD-10-CM | POA: Diagnosis not present

## 2021-05-03 DIAGNOSIS — F17210 Nicotine dependence, cigarettes, uncomplicated: Secondary | ICD-10-CM | POA: Diagnosis not present

## 2021-05-03 DIAGNOSIS — E041 Nontoxic single thyroid nodule: Secondary | ICD-10-CM | POA: Diagnosis not present

## 2021-05-03 DIAGNOSIS — I2510 Atherosclerotic heart disease of native coronary artery without angina pectoris: Secondary | ICD-10-CM | POA: Diagnosis not present

## 2021-05-08 DIAGNOSIS — E785 Hyperlipidemia, unspecified: Secondary | ICD-10-CM | POA: Diagnosis not present

## 2021-05-08 DIAGNOSIS — M150 Primary generalized (osteo)arthritis: Secondary | ICD-10-CM | POA: Diagnosis not present

## 2021-05-08 DIAGNOSIS — I10 Essential (primary) hypertension: Secondary | ICD-10-CM | POA: Diagnosis not present

## 2021-05-08 DIAGNOSIS — G8929 Other chronic pain: Secondary | ICD-10-CM | POA: Diagnosis not present

## 2021-05-08 DIAGNOSIS — M47816 Spondylosis without myelopathy or radiculopathy, lumbar region: Secondary | ICD-10-CM | POA: Diagnosis not present

## 2021-05-08 DIAGNOSIS — G500 Trigeminal neuralgia: Secondary | ICD-10-CM | POA: Diagnosis not present

## 2021-05-08 DIAGNOSIS — M25562 Pain in left knee: Secondary | ICD-10-CM | POA: Diagnosis not present

## 2021-05-08 DIAGNOSIS — Z79891 Long term (current) use of opiate analgesic: Secondary | ICD-10-CM | POA: Diagnosis not present

## 2021-05-08 DIAGNOSIS — M545 Low back pain, unspecified: Secondary | ICD-10-CM | POA: Diagnosis not present

## 2021-05-12 DIAGNOSIS — M545 Low back pain, unspecified: Secondary | ICD-10-CM | POA: Diagnosis not present

## 2021-05-12 DIAGNOSIS — I10 Essential (primary) hypertension: Secondary | ICD-10-CM | POA: Diagnosis not present

## 2021-05-12 DIAGNOSIS — G500 Trigeminal neuralgia: Secondary | ICD-10-CM | POA: Diagnosis not present

## 2021-05-12 DIAGNOSIS — E041 Nontoxic single thyroid nodule: Secondary | ICD-10-CM | POA: Diagnosis not present

## 2021-05-12 DIAGNOSIS — M47816 Spondylosis without myelopathy or radiculopathy, lumbar region: Secondary | ICD-10-CM | POA: Diagnosis not present

## 2021-05-12 DIAGNOSIS — G8929 Other chronic pain: Secondary | ICD-10-CM | POA: Diagnosis not present

## 2021-05-12 DIAGNOSIS — Z79891 Long term (current) use of opiate analgesic: Secondary | ICD-10-CM | POA: Diagnosis not present

## 2021-05-12 DIAGNOSIS — M25562 Pain in left knee: Secondary | ICD-10-CM | POA: Diagnosis not present

## 2021-05-12 DIAGNOSIS — E785 Hyperlipidemia, unspecified: Secondary | ICD-10-CM | POA: Diagnosis not present

## 2021-05-12 DIAGNOSIS — M150 Primary generalized (osteo)arthritis: Secondary | ICD-10-CM | POA: Diagnosis not present

## 2021-05-15 DIAGNOSIS — I10 Essential (primary) hypertension: Secondary | ICD-10-CM | POA: Diagnosis not present

## 2021-05-15 DIAGNOSIS — M25562 Pain in left knee: Secondary | ICD-10-CM | POA: Diagnosis not present

## 2021-05-15 DIAGNOSIS — G500 Trigeminal neuralgia: Secondary | ICD-10-CM | POA: Diagnosis not present

## 2021-05-15 DIAGNOSIS — Z79891 Long term (current) use of opiate analgesic: Secondary | ICD-10-CM | POA: Diagnosis not present

## 2021-05-15 DIAGNOSIS — M150 Primary generalized (osteo)arthritis: Secondary | ICD-10-CM | POA: Diagnosis not present

## 2021-05-15 DIAGNOSIS — M545 Low back pain, unspecified: Secondary | ICD-10-CM | POA: Diagnosis not present

## 2021-05-15 DIAGNOSIS — G8929 Other chronic pain: Secondary | ICD-10-CM | POA: Diagnosis not present

## 2021-05-15 DIAGNOSIS — M47816 Spondylosis without myelopathy or radiculopathy, lumbar region: Secondary | ICD-10-CM | POA: Diagnosis not present

## 2021-05-15 DIAGNOSIS — E785 Hyperlipidemia, unspecified: Secondary | ICD-10-CM | POA: Diagnosis not present

## 2021-05-22 DIAGNOSIS — I10 Essential (primary) hypertension: Secondary | ICD-10-CM | POA: Diagnosis not present

## 2021-05-22 DIAGNOSIS — J449 Chronic obstructive pulmonary disease, unspecified: Secondary | ICD-10-CM | POA: Diagnosis not present

## 2021-05-22 DIAGNOSIS — E7849 Other hyperlipidemia: Secondary | ICD-10-CM | POA: Diagnosis not present

## 2021-05-22 DIAGNOSIS — Z720 Tobacco use: Secondary | ICD-10-CM | POA: Diagnosis not present

## 2021-05-23 DIAGNOSIS — M47816 Spondylosis without myelopathy or radiculopathy, lumbar region: Secondary | ICD-10-CM | POA: Diagnosis not present

## 2021-05-23 DIAGNOSIS — G8929 Other chronic pain: Secondary | ICD-10-CM | POA: Diagnosis not present

## 2021-05-23 DIAGNOSIS — M25562 Pain in left knee: Secondary | ICD-10-CM | POA: Diagnosis not present

## 2021-05-23 DIAGNOSIS — M150 Primary generalized (osteo)arthritis: Secondary | ICD-10-CM | POA: Diagnosis not present

## 2021-05-23 DIAGNOSIS — I10 Essential (primary) hypertension: Secondary | ICD-10-CM | POA: Diagnosis not present

## 2021-05-23 DIAGNOSIS — Z79891 Long term (current) use of opiate analgesic: Secondary | ICD-10-CM | POA: Diagnosis not present

## 2021-05-23 DIAGNOSIS — E785 Hyperlipidemia, unspecified: Secondary | ICD-10-CM | POA: Diagnosis not present

## 2021-05-23 DIAGNOSIS — G500 Trigeminal neuralgia: Secondary | ICD-10-CM | POA: Diagnosis not present

## 2021-05-23 DIAGNOSIS — M545 Low back pain, unspecified: Secondary | ICD-10-CM | POA: Diagnosis not present

## 2021-05-26 DIAGNOSIS — G8929 Other chronic pain: Secondary | ICD-10-CM | POA: Diagnosis not present

## 2021-05-26 DIAGNOSIS — M150 Primary generalized (osteo)arthritis: Secondary | ICD-10-CM | POA: Diagnosis not present

## 2021-05-26 DIAGNOSIS — Z79891 Long term (current) use of opiate analgesic: Secondary | ICD-10-CM | POA: Diagnosis not present

## 2021-05-26 DIAGNOSIS — M545 Low back pain, unspecified: Secondary | ICD-10-CM | POA: Diagnosis not present

## 2021-05-26 DIAGNOSIS — M47816 Spondylosis without myelopathy or radiculopathy, lumbar region: Secondary | ICD-10-CM | POA: Diagnosis not present

## 2021-05-26 DIAGNOSIS — M25562 Pain in left knee: Secondary | ICD-10-CM | POA: Diagnosis not present

## 2021-05-26 DIAGNOSIS — E785 Hyperlipidemia, unspecified: Secondary | ICD-10-CM | POA: Diagnosis not present

## 2021-05-26 DIAGNOSIS — I10 Essential (primary) hypertension: Secondary | ICD-10-CM | POA: Diagnosis not present

## 2021-05-26 DIAGNOSIS — G500 Trigeminal neuralgia: Secondary | ICD-10-CM | POA: Diagnosis not present

## 2021-06-01 DIAGNOSIS — E785 Hyperlipidemia, unspecified: Secondary | ICD-10-CM | POA: Diagnosis not present

## 2021-06-01 DIAGNOSIS — Z79891 Long term (current) use of opiate analgesic: Secondary | ICD-10-CM | POA: Diagnosis not present

## 2021-06-01 DIAGNOSIS — M545 Low back pain, unspecified: Secondary | ICD-10-CM | POA: Diagnosis not present

## 2021-06-01 DIAGNOSIS — G500 Trigeminal neuralgia: Secondary | ICD-10-CM | POA: Diagnosis not present

## 2021-06-01 DIAGNOSIS — I10 Essential (primary) hypertension: Secondary | ICD-10-CM | POA: Diagnosis not present

## 2021-06-01 DIAGNOSIS — M47816 Spondylosis without myelopathy or radiculopathy, lumbar region: Secondary | ICD-10-CM | POA: Diagnosis not present

## 2021-06-01 DIAGNOSIS — G8929 Other chronic pain: Secondary | ICD-10-CM | POA: Diagnosis not present

## 2021-06-01 DIAGNOSIS — M150 Primary generalized (osteo)arthritis: Secondary | ICD-10-CM | POA: Diagnosis not present

## 2021-06-01 DIAGNOSIS — M25562 Pain in left knee: Secondary | ICD-10-CM | POA: Diagnosis not present

## 2021-06-05 DIAGNOSIS — M545 Low back pain, unspecified: Secondary | ICD-10-CM | POA: Diagnosis not present

## 2021-06-05 DIAGNOSIS — M47816 Spondylosis without myelopathy or radiculopathy, lumbar region: Secondary | ICD-10-CM | POA: Diagnosis not present

## 2021-06-05 DIAGNOSIS — G500 Trigeminal neuralgia: Secondary | ICD-10-CM | POA: Diagnosis not present

## 2021-06-05 DIAGNOSIS — Z79891 Long term (current) use of opiate analgesic: Secondary | ICD-10-CM | POA: Diagnosis not present

## 2021-06-05 DIAGNOSIS — G8929 Other chronic pain: Secondary | ICD-10-CM | POA: Diagnosis not present

## 2021-06-05 DIAGNOSIS — M150 Primary generalized (osteo)arthritis: Secondary | ICD-10-CM | POA: Diagnosis not present

## 2021-06-05 DIAGNOSIS — M25562 Pain in left knee: Secondary | ICD-10-CM | POA: Diagnosis not present

## 2021-06-05 DIAGNOSIS — E785 Hyperlipidemia, unspecified: Secondary | ICD-10-CM | POA: Diagnosis not present

## 2021-06-05 DIAGNOSIS — I10 Essential (primary) hypertension: Secondary | ICD-10-CM | POA: Diagnosis not present

## 2021-06-12 DIAGNOSIS — G500 Trigeminal neuralgia: Secondary | ICD-10-CM | POA: Diagnosis not present

## 2021-06-12 DIAGNOSIS — Z79891 Long term (current) use of opiate analgesic: Secondary | ICD-10-CM | POA: Diagnosis not present

## 2021-06-12 DIAGNOSIS — M47816 Spondylosis without myelopathy or radiculopathy, lumbar region: Secondary | ICD-10-CM | POA: Diagnosis not present

## 2021-06-12 DIAGNOSIS — G8929 Other chronic pain: Secondary | ICD-10-CM | POA: Diagnosis not present

## 2021-06-12 DIAGNOSIS — M545 Low back pain, unspecified: Secondary | ICD-10-CM | POA: Diagnosis not present

## 2021-06-12 DIAGNOSIS — E785 Hyperlipidemia, unspecified: Secondary | ICD-10-CM | POA: Diagnosis not present

## 2021-06-12 DIAGNOSIS — M150 Primary generalized (osteo)arthritis: Secondary | ICD-10-CM | POA: Diagnosis not present

## 2021-06-12 DIAGNOSIS — I10 Essential (primary) hypertension: Secondary | ICD-10-CM | POA: Diagnosis not present

## 2021-06-12 DIAGNOSIS — M25562 Pain in left knee: Secondary | ICD-10-CM | POA: Diagnosis not present

## 2021-06-13 DIAGNOSIS — M1710 Unilateral primary osteoarthritis, unspecified knee: Secondary | ICD-10-CM | POA: Diagnosis not present

## 2021-06-13 DIAGNOSIS — M47816 Spondylosis without myelopathy or radiculopathy, lumbar region: Secondary | ICD-10-CM | POA: Diagnosis not present

## 2021-06-13 DIAGNOSIS — Z79891 Long term (current) use of opiate analgesic: Secondary | ICD-10-CM | POA: Diagnosis not present

## 2021-06-21 DIAGNOSIS — M150 Primary generalized (osteo)arthritis: Secondary | ICD-10-CM | POA: Diagnosis not present

## 2021-06-21 DIAGNOSIS — M25562 Pain in left knee: Secondary | ICD-10-CM | POA: Diagnosis not present

## 2021-06-21 DIAGNOSIS — G500 Trigeminal neuralgia: Secondary | ICD-10-CM | POA: Diagnosis not present

## 2021-06-21 DIAGNOSIS — M47816 Spondylosis without myelopathy or radiculopathy, lumbar region: Secondary | ICD-10-CM | POA: Diagnosis not present

## 2021-06-21 DIAGNOSIS — E785 Hyperlipidemia, unspecified: Secondary | ICD-10-CM | POA: Diagnosis not present

## 2021-06-21 DIAGNOSIS — I10 Essential (primary) hypertension: Secondary | ICD-10-CM | POA: Diagnosis not present

## 2021-06-21 DIAGNOSIS — M545 Low back pain, unspecified: Secondary | ICD-10-CM | POA: Diagnosis not present

## 2021-06-21 DIAGNOSIS — G8929 Other chronic pain: Secondary | ICD-10-CM | POA: Diagnosis not present

## 2021-06-21 DIAGNOSIS — Z79891 Long term (current) use of opiate analgesic: Secondary | ICD-10-CM | POA: Diagnosis not present

## 2021-06-22 DIAGNOSIS — E7849 Other hyperlipidemia: Secondary | ICD-10-CM | POA: Diagnosis not present

## 2021-06-22 DIAGNOSIS — I10 Essential (primary) hypertension: Secondary | ICD-10-CM | POA: Diagnosis not present

## 2021-06-22 DIAGNOSIS — Z720 Tobacco use: Secondary | ICD-10-CM | POA: Diagnosis not present

## 2021-06-22 DIAGNOSIS — J449 Chronic obstructive pulmonary disease, unspecified: Secondary | ICD-10-CM | POA: Diagnosis not present

## 2021-06-28 DIAGNOSIS — M25562 Pain in left knee: Secondary | ICD-10-CM | POA: Diagnosis not present

## 2021-06-28 DIAGNOSIS — M47816 Spondylosis without myelopathy or radiculopathy, lumbar region: Secondary | ICD-10-CM | POA: Diagnosis not present

## 2021-06-28 DIAGNOSIS — E785 Hyperlipidemia, unspecified: Secondary | ICD-10-CM | POA: Diagnosis not present

## 2021-06-28 DIAGNOSIS — M150 Primary generalized (osteo)arthritis: Secondary | ICD-10-CM | POA: Diagnosis not present

## 2021-06-28 DIAGNOSIS — G500 Trigeminal neuralgia: Secondary | ICD-10-CM | POA: Diagnosis not present

## 2021-06-28 DIAGNOSIS — I10 Essential (primary) hypertension: Secondary | ICD-10-CM | POA: Diagnosis not present

## 2021-06-28 DIAGNOSIS — M545 Low back pain, unspecified: Secondary | ICD-10-CM | POA: Diagnosis not present

## 2021-06-28 DIAGNOSIS — Z79891 Long term (current) use of opiate analgesic: Secondary | ICD-10-CM | POA: Diagnosis not present

## 2021-06-28 DIAGNOSIS — G8929 Other chronic pain: Secondary | ICD-10-CM | POA: Diagnosis not present

## 2021-07-06 DIAGNOSIS — E785 Hyperlipidemia, unspecified: Secondary | ICD-10-CM | POA: Diagnosis not present

## 2021-07-06 DIAGNOSIS — G500 Trigeminal neuralgia: Secondary | ICD-10-CM | POA: Diagnosis not present

## 2021-07-06 DIAGNOSIS — Z79891 Long term (current) use of opiate analgesic: Secondary | ICD-10-CM | POA: Diagnosis not present

## 2021-07-06 DIAGNOSIS — M150 Primary generalized (osteo)arthritis: Secondary | ICD-10-CM | POA: Diagnosis not present

## 2021-07-06 DIAGNOSIS — I10 Essential (primary) hypertension: Secondary | ICD-10-CM | POA: Diagnosis not present

## 2021-07-06 DIAGNOSIS — M545 Low back pain, unspecified: Secondary | ICD-10-CM | POA: Diagnosis not present

## 2021-07-06 DIAGNOSIS — M25562 Pain in left knee: Secondary | ICD-10-CM | POA: Diagnosis not present

## 2021-07-06 DIAGNOSIS — G8929 Other chronic pain: Secondary | ICD-10-CM | POA: Diagnosis not present

## 2021-07-06 DIAGNOSIS — M47816 Spondylosis without myelopathy or radiculopathy, lumbar region: Secondary | ICD-10-CM | POA: Diagnosis not present

## 2021-07-17 DIAGNOSIS — M1710 Unilateral primary osteoarthritis, unspecified knee: Secondary | ICD-10-CM | POA: Diagnosis not present

## 2021-08-03 DIAGNOSIS — Z1231 Encounter for screening mammogram for malignant neoplasm of breast: Secondary | ICD-10-CM | POA: Diagnosis not present

## 2021-08-08 DIAGNOSIS — Z79891 Long term (current) use of opiate analgesic: Secondary | ICD-10-CM | POA: Diagnosis not present

## 2021-08-08 DIAGNOSIS — M5412 Radiculopathy, cervical region: Secondary | ICD-10-CM | POA: Diagnosis not present

## 2021-08-23 DIAGNOSIS — J449 Chronic obstructive pulmonary disease, unspecified: Secondary | ICD-10-CM | POA: Diagnosis not present

## 2021-08-23 DIAGNOSIS — Z720 Tobacco use: Secondary | ICD-10-CM | POA: Diagnosis not present

## 2021-08-23 DIAGNOSIS — I10 Essential (primary) hypertension: Secondary | ICD-10-CM | POA: Diagnosis not present

## 2021-08-23 DIAGNOSIS — E7849 Other hyperlipidemia: Secondary | ICD-10-CM | POA: Diagnosis not present

## 2021-08-29 DIAGNOSIS — I10 Essential (primary) hypertension: Secondary | ICD-10-CM | POA: Diagnosis not present

## 2021-08-29 DIAGNOSIS — E782 Mixed hyperlipidemia: Secondary | ICD-10-CM | POA: Diagnosis not present

## 2021-08-29 DIAGNOSIS — K2100 Gastro-esophageal reflux disease with esophagitis, without bleeding: Secondary | ICD-10-CM | POA: Diagnosis not present

## 2021-08-29 DIAGNOSIS — E7849 Other hyperlipidemia: Secondary | ICD-10-CM | POA: Diagnosis not present

## 2021-08-29 DIAGNOSIS — E041 Nontoxic single thyroid nodule: Secondary | ICD-10-CM | POA: Diagnosis not present

## 2021-08-29 DIAGNOSIS — J449 Chronic obstructive pulmonary disease, unspecified: Secondary | ICD-10-CM | POA: Diagnosis not present

## 2021-08-31 DIAGNOSIS — Z23 Encounter for immunization: Secondary | ICD-10-CM | POA: Diagnosis not present

## 2021-08-31 DIAGNOSIS — K2100 Gastro-esophageal reflux disease with esophagitis, without bleeding: Secondary | ICD-10-CM | POA: Diagnosis not present

## 2021-08-31 DIAGNOSIS — J449 Chronic obstructive pulmonary disease, unspecified: Secondary | ICD-10-CM | POA: Diagnosis not present

## 2021-08-31 DIAGNOSIS — E7849 Other hyperlipidemia: Secondary | ICD-10-CM | POA: Diagnosis not present

## 2021-08-31 DIAGNOSIS — I700 Atherosclerosis of aorta: Secondary | ICD-10-CM | POA: Diagnosis not present

## 2021-08-31 DIAGNOSIS — I10 Essential (primary) hypertension: Secondary | ICD-10-CM | POA: Diagnosis not present

## 2021-11-23 DIAGNOSIS — G959 Disease of spinal cord, unspecified: Secondary | ICD-10-CM | POA: Diagnosis not present

## 2021-11-23 DIAGNOSIS — E785 Hyperlipidemia, unspecified: Secondary | ICD-10-CM | POA: Diagnosis not present

## 2021-11-23 DIAGNOSIS — I10 Essential (primary) hypertension: Secondary | ICD-10-CM | POA: Diagnosis not present

## 2021-11-23 DIAGNOSIS — R627 Adult failure to thrive: Secondary | ICD-10-CM | POA: Diagnosis not present

## 2021-11-23 DIAGNOSIS — Z20822 Contact with and (suspected) exposure to covid-19: Secondary | ICD-10-CM | POA: Diagnosis not present

## 2021-11-23 DIAGNOSIS — M47812 Spondylosis without myelopathy or radiculopathy, cervical region: Secondary | ICD-10-CM | POA: Diagnosis not present

## 2021-11-23 DIAGNOSIS — R531 Weakness: Secondary | ICD-10-CM | POA: Diagnosis not present

## 2021-11-23 DIAGNOSIS — R9082 White matter disease, unspecified: Secondary | ICD-10-CM | POA: Diagnosis not present

## 2021-11-23 DIAGNOSIS — G629 Polyneuropathy, unspecified: Secondary | ICD-10-CM | POA: Diagnosis not present

## 2021-11-23 DIAGNOSIS — G319 Degenerative disease of nervous system, unspecified: Secondary | ICD-10-CM | POA: Diagnosis not present

## 2021-11-23 DIAGNOSIS — R1312 Dysphagia, oropharyngeal phase: Secondary | ICD-10-CM | POA: Diagnosis not present

## 2021-11-23 DIAGNOSIS — G4700 Insomnia, unspecified: Secondary | ICD-10-CM | POA: Diagnosis not present

## 2021-11-23 DIAGNOSIS — G8929 Other chronic pain: Secondary | ICD-10-CM | POA: Diagnosis not present

## 2021-11-23 DIAGNOSIS — J986 Disorders of diaphragm: Secondary | ICD-10-CM | POA: Diagnosis not present

## 2021-11-23 DIAGNOSIS — R2689 Other abnormalities of gait and mobility: Secondary | ICD-10-CM | POA: Diagnosis not present

## 2021-11-23 DIAGNOSIS — E876 Hypokalemia: Secondary | ICD-10-CM | POA: Diagnosis not present

## 2021-11-23 DIAGNOSIS — B952 Enterococcus as the cause of diseases classified elsewhere: Secondary | ICD-10-CM | POA: Diagnosis not present

## 2021-11-23 DIAGNOSIS — D72829 Elevated white blood cell count, unspecified: Secondary | ICD-10-CM | POA: Diagnosis not present

## 2021-11-23 DIAGNOSIS — R2681 Unsteadiness on feet: Secondary | ICD-10-CM | POA: Diagnosis not present

## 2021-11-23 DIAGNOSIS — Z720 Tobacco use: Secondary | ICD-10-CM | POA: Diagnosis not present

## 2021-11-23 DIAGNOSIS — R791 Abnormal coagulation profile: Secondary | ICD-10-CM | POA: Diagnosis not present

## 2021-11-23 DIAGNOSIS — M4712 Other spondylosis with myelopathy, cervical region: Secondary | ICD-10-CM | POA: Diagnosis not present

## 2021-11-23 DIAGNOSIS — D649 Anemia, unspecified: Secondary | ICD-10-CM | POA: Diagnosis not present

## 2021-11-23 DIAGNOSIS — E860 Dehydration: Secondary | ICD-10-CM | POA: Diagnosis not present

## 2021-11-23 DIAGNOSIS — R404 Transient alteration of awareness: Secondary | ICD-10-CM | POA: Diagnosis not present

## 2021-11-23 DIAGNOSIS — M5002 Cervical disc disorder with myelopathy, mid-cervical region, unspecified level: Secondary | ICD-10-CM | POA: Diagnosis not present

## 2021-11-23 DIAGNOSIS — R7989 Other specified abnormal findings of blood chemistry: Secondary | ICD-10-CM | POA: Diagnosis not present

## 2021-11-23 DIAGNOSIS — J9811 Atelectasis: Secondary | ICD-10-CM | POA: Diagnosis not present

## 2021-11-23 DIAGNOSIS — G9520 Unspecified cord compression: Secondary | ICD-10-CM | POA: Diagnosis not present

## 2021-11-23 DIAGNOSIS — B954 Other streptococcus as the cause of diseases classified elsewhere: Secondary | ICD-10-CM | POA: Diagnosis not present

## 2021-11-23 DIAGNOSIS — R0902 Hypoxemia: Secondary | ICD-10-CM | POA: Diagnosis not present

## 2021-11-23 DIAGNOSIS — Z9889 Other specified postprocedural states: Secondary | ICD-10-CM | POA: Diagnosis not present

## 2021-11-23 DIAGNOSIS — Z472 Encounter for removal of internal fixation device: Secondary | ICD-10-CM | POA: Diagnosis not present

## 2021-11-23 DIAGNOSIS — M5031 Other cervical disc degeneration,  high cervical region: Secondary | ICD-10-CM | POA: Diagnosis not present

## 2021-11-23 DIAGNOSIS — M4802 Spinal stenosis, cervical region: Secondary | ICD-10-CM | POA: Diagnosis not present

## 2021-11-23 DIAGNOSIS — N3000 Acute cystitis without hematuria: Secondary | ICD-10-CM | POA: Diagnosis not present

## 2021-11-23 DIAGNOSIS — E870 Hyperosmolality and hypernatremia: Secondary | ICD-10-CM | POA: Diagnosis not present

## 2021-11-23 DIAGNOSIS — E538 Deficiency of other specified B group vitamins: Secondary | ICD-10-CM | POA: Diagnosis not present

## 2021-11-23 DIAGNOSIS — Z743 Need for continuous supervision: Secondary | ICD-10-CM | POA: Diagnosis not present

## 2021-11-23 DIAGNOSIS — G894 Chronic pain syndrome: Secondary | ICD-10-CM | POA: Diagnosis not present

## 2021-11-23 DIAGNOSIS — M4312 Spondylolisthesis, cervical region: Secondary | ICD-10-CM | POA: Diagnosis not present

## 2021-11-23 DIAGNOSIS — M6281 Muscle weakness (generalized): Secondary | ICD-10-CM | POA: Diagnosis not present

## 2021-11-23 DIAGNOSIS — I517 Cardiomegaly: Secondary | ICD-10-CM | POA: Diagnosis not present

## 2021-11-23 DIAGNOSIS — R000 Tachycardia, unspecified: Secondary | ICD-10-CM | POA: Diagnosis not present

## 2021-11-23 DIAGNOSIS — Z981 Arthrodesis status: Secondary | ICD-10-CM | POA: Diagnosis not present

## 2021-11-23 DIAGNOSIS — Z79899 Other long term (current) drug therapy: Secondary | ICD-10-CM | POA: Diagnosis not present

## 2021-11-23 DIAGNOSIS — M4313 Spondylolisthesis, cervicothoracic region: Secondary | ICD-10-CM | POA: Diagnosis not present

## 2021-11-23 DIAGNOSIS — R488 Other symbolic dysfunctions: Secondary | ICD-10-CM | POA: Diagnosis not present

## 2021-11-27 DIAGNOSIS — Z79899 Other long term (current) drug therapy: Secondary | ICD-10-CM | POA: Diagnosis not present

## 2021-11-28 DIAGNOSIS — B954 Other streptococcus as the cause of diseases classified elsewhere: Secondary | ICD-10-CM | POA: Diagnosis not present

## 2021-11-28 DIAGNOSIS — M4313 Spondylolisthesis, cervicothoracic region: Secondary | ICD-10-CM | POA: Diagnosis not present

## 2021-11-28 DIAGNOSIS — E876 Hypokalemia: Secondary | ICD-10-CM | POA: Diagnosis not present

## 2021-11-28 DIAGNOSIS — R531 Weakness: Secondary | ICD-10-CM | POA: Diagnosis not present

## 2021-11-28 DIAGNOSIS — Z743 Need for continuous supervision: Secondary | ICD-10-CM | POA: Diagnosis not present

## 2021-11-28 DIAGNOSIS — Z20822 Contact with and (suspected) exposure to covid-19: Secondary | ICD-10-CM | POA: Diagnosis not present

## 2021-11-28 DIAGNOSIS — D649 Anemia, unspecified: Secondary | ICD-10-CM | POA: Diagnosis not present

## 2021-11-28 DIAGNOSIS — R000 Tachycardia, unspecified: Secondary | ICD-10-CM | POA: Diagnosis not present

## 2021-11-28 DIAGNOSIS — Z981 Arthrodesis status: Secondary | ICD-10-CM | POA: Diagnosis not present

## 2021-11-28 DIAGNOSIS — R2689 Other abnormalities of gait and mobility: Secondary | ICD-10-CM | POA: Diagnosis not present

## 2021-11-28 DIAGNOSIS — Z9889 Other specified postprocedural states: Secondary | ICD-10-CM | POA: Diagnosis not present

## 2021-11-28 DIAGNOSIS — R7989 Other specified abnormal findings of blood chemistry: Secondary | ICD-10-CM | POA: Diagnosis not present

## 2021-11-28 DIAGNOSIS — G894 Chronic pain syndrome: Secondary | ICD-10-CM | POA: Diagnosis not present

## 2021-11-28 DIAGNOSIS — R0902 Hypoxemia: Secondary | ICD-10-CM | POA: Diagnosis not present

## 2021-11-28 DIAGNOSIS — I10 Essential (primary) hypertension: Secondary | ICD-10-CM | POA: Diagnosis not present

## 2021-11-28 DIAGNOSIS — R627 Adult failure to thrive: Secondary | ICD-10-CM | POA: Diagnosis not present

## 2021-11-28 DIAGNOSIS — J9811 Atelectasis: Secondary | ICD-10-CM | POA: Diagnosis not present

## 2021-11-28 DIAGNOSIS — G959 Disease of spinal cord, unspecified: Secondary | ICD-10-CM | POA: Diagnosis not present

## 2021-11-28 DIAGNOSIS — R9082 White matter disease, unspecified: Secondary | ICD-10-CM | POA: Diagnosis not present

## 2021-11-28 DIAGNOSIS — N3000 Acute cystitis without hematuria: Secondary | ICD-10-CM | POA: Diagnosis not present

## 2021-11-28 DIAGNOSIS — E538 Deficiency of other specified B group vitamins: Secondary | ICD-10-CM | POA: Diagnosis not present

## 2021-11-28 DIAGNOSIS — M4802 Spinal stenosis, cervical region: Secondary | ICD-10-CM | POA: Diagnosis not present

## 2021-11-28 DIAGNOSIS — G319 Degenerative disease of nervous system, unspecified: Secondary | ICD-10-CM | POA: Diagnosis not present

## 2021-11-28 DIAGNOSIS — R404 Transient alteration of awareness: Secondary | ICD-10-CM | POA: Diagnosis not present

## 2021-11-28 DIAGNOSIS — M47812 Spondylosis without myelopathy or radiculopathy, cervical region: Secondary | ICD-10-CM | POA: Diagnosis not present

## 2021-11-28 DIAGNOSIS — E785 Hyperlipidemia, unspecified: Secondary | ICD-10-CM | POA: Diagnosis not present

## 2021-11-28 DIAGNOSIS — M4312 Spondylolisthesis, cervical region: Secondary | ICD-10-CM | POA: Diagnosis not present

## 2021-11-28 DIAGNOSIS — B952 Enterococcus as the cause of diseases classified elsewhere: Secondary | ICD-10-CM | POA: Diagnosis not present

## 2021-11-28 DIAGNOSIS — I517 Cardiomegaly: Secondary | ICD-10-CM | POA: Diagnosis not present

## 2021-11-28 DIAGNOSIS — J986 Disorders of diaphragm: Secondary | ICD-10-CM | POA: Diagnosis not present

## 2021-11-28 DIAGNOSIS — E860 Dehydration: Secondary | ICD-10-CM | POA: Diagnosis not present

## 2021-11-28 DIAGNOSIS — G629 Polyneuropathy, unspecified: Secondary | ICD-10-CM | POA: Diagnosis not present

## 2021-11-28 DIAGNOSIS — M4712 Other spondylosis with myelopathy, cervical region: Secondary | ICD-10-CM | POA: Diagnosis not present

## 2021-11-28 DIAGNOSIS — E870 Hyperosmolality and hypernatremia: Secondary | ICD-10-CM | POA: Diagnosis not present

## 2021-11-28 DIAGNOSIS — G4700 Insomnia, unspecified: Secondary | ICD-10-CM | POA: Diagnosis not present

## 2021-11-28 DIAGNOSIS — M5031 Other cervical disc degeneration,  high cervical region: Secondary | ICD-10-CM | POA: Diagnosis not present

## 2021-11-29 DIAGNOSIS — B954 Other streptococcus as the cause of diseases classified elsewhere: Secondary | ICD-10-CM | POA: Diagnosis not present

## 2021-11-29 DIAGNOSIS — R000 Tachycardia, unspecified: Secondary | ICD-10-CM | POA: Diagnosis not present

## 2021-11-29 DIAGNOSIS — N3000 Acute cystitis without hematuria: Secondary | ICD-10-CM | POA: Diagnosis not present

## 2021-11-29 DIAGNOSIS — R7989 Other specified abnormal findings of blood chemistry: Secondary | ICD-10-CM | POA: Diagnosis not present

## 2021-11-29 DIAGNOSIS — R531 Weakness: Secondary | ICD-10-CM | POA: Diagnosis not present

## 2021-11-29 DIAGNOSIS — B952 Enterococcus as the cause of diseases classified elsewhere: Secondary | ICD-10-CM | POA: Diagnosis not present

## 2021-11-29 DIAGNOSIS — M4802 Spinal stenosis, cervical region: Secondary | ICD-10-CM | POA: Diagnosis not present

## 2021-11-29 DIAGNOSIS — E785 Hyperlipidemia, unspecified: Secondary | ICD-10-CM | POA: Diagnosis not present

## 2021-11-29 DIAGNOSIS — M4313 Spondylolisthesis, cervicothoracic region: Secondary | ICD-10-CM | POA: Diagnosis not present

## 2021-11-29 DIAGNOSIS — M4803 Spinal stenosis, cervicothoracic region: Secondary | ICD-10-CM | POA: Diagnosis not present

## 2021-11-29 DIAGNOSIS — G894 Chronic pain syndrome: Secondary | ICD-10-CM | POA: Diagnosis not present

## 2021-11-29 DIAGNOSIS — R627 Adult failure to thrive: Secondary | ICD-10-CM | POA: Diagnosis not present

## 2021-11-29 DIAGNOSIS — G959 Disease of spinal cord, unspecified: Secondary | ICD-10-CM | POA: Diagnosis not present

## 2021-11-29 DIAGNOSIS — M47812 Spondylosis without myelopathy or radiculopathy, cervical region: Secondary | ICD-10-CM | POA: Diagnosis not present

## 2021-11-29 DIAGNOSIS — D649 Anemia, unspecified: Secondary | ICD-10-CM | POA: Diagnosis not present

## 2021-11-29 DIAGNOSIS — Z981 Arthrodesis status: Secondary | ICD-10-CM | POA: Diagnosis not present

## 2021-11-29 DIAGNOSIS — R2689 Other abnormalities of gait and mobility: Secondary | ICD-10-CM | POA: Diagnosis not present

## 2021-11-29 DIAGNOSIS — E870 Hyperosmolality and hypernatremia: Secondary | ICD-10-CM | POA: Diagnosis not present

## 2021-11-29 DIAGNOSIS — E876 Hypokalemia: Secondary | ICD-10-CM | POA: Diagnosis not present

## 2021-11-29 DIAGNOSIS — G4700 Insomnia, unspecified: Secondary | ICD-10-CM | POA: Diagnosis not present

## 2021-11-29 DIAGNOSIS — I10 Essential (primary) hypertension: Secondary | ICD-10-CM | POA: Diagnosis not present

## 2021-11-29 DIAGNOSIS — Z20822 Contact with and (suspected) exposure to covid-19: Secondary | ICD-10-CM | POA: Diagnosis not present

## 2021-11-29 DIAGNOSIS — G629 Polyneuropathy, unspecified: Secondary | ICD-10-CM | POA: Diagnosis not present

## 2021-11-29 DIAGNOSIS — E538 Deficiency of other specified B group vitamins: Secondary | ICD-10-CM | POA: Diagnosis not present

## 2021-11-29 DIAGNOSIS — M4712 Other spondylosis with myelopathy, cervical region: Secondary | ICD-10-CM | POA: Diagnosis not present

## 2021-11-29 DIAGNOSIS — E860 Dehydration: Secondary | ICD-10-CM | POA: Diagnosis not present

## 2021-11-30 DIAGNOSIS — M4313 Spondylolisthesis, cervicothoracic region: Secondary | ICD-10-CM | POA: Diagnosis not present

## 2021-11-30 DIAGNOSIS — E860 Dehydration: Secondary | ICD-10-CM | POA: Diagnosis not present

## 2021-11-30 DIAGNOSIS — R2689 Other abnormalities of gait and mobility: Secondary | ICD-10-CM | POA: Diagnosis not present

## 2021-11-30 DIAGNOSIS — G4700 Insomnia, unspecified: Secondary | ICD-10-CM | POA: Diagnosis not present

## 2021-11-30 DIAGNOSIS — M4802 Spinal stenosis, cervical region: Secondary | ICD-10-CM | POA: Diagnosis not present

## 2021-11-30 DIAGNOSIS — R531 Weakness: Secondary | ICD-10-CM | POA: Diagnosis not present

## 2021-11-30 DIAGNOSIS — M47812 Spondylosis without myelopathy or radiculopathy, cervical region: Secondary | ICD-10-CM | POA: Diagnosis not present

## 2021-11-30 DIAGNOSIS — E538 Deficiency of other specified B group vitamins: Secondary | ICD-10-CM | POA: Diagnosis not present

## 2021-11-30 DIAGNOSIS — B954 Other streptococcus as the cause of diseases classified elsewhere: Secondary | ICD-10-CM | POA: Diagnosis not present

## 2021-11-30 DIAGNOSIS — E785 Hyperlipidemia, unspecified: Secondary | ICD-10-CM | POA: Diagnosis not present

## 2021-11-30 DIAGNOSIS — Z20822 Contact with and (suspected) exposure to covid-19: Secondary | ICD-10-CM | POA: Diagnosis not present

## 2021-11-30 DIAGNOSIS — E876 Hypokalemia: Secondary | ICD-10-CM | POA: Diagnosis not present

## 2021-11-30 DIAGNOSIS — N3000 Acute cystitis without hematuria: Secondary | ICD-10-CM | POA: Diagnosis not present

## 2021-11-30 DIAGNOSIS — E870 Hyperosmolality and hypernatremia: Secondary | ICD-10-CM | POA: Diagnosis not present

## 2021-11-30 DIAGNOSIS — B952 Enterococcus as the cause of diseases classified elsewhere: Secondary | ICD-10-CM | POA: Diagnosis not present

## 2021-11-30 DIAGNOSIS — G629 Polyneuropathy, unspecified: Secondary | ICD-10-CM | POA: Diagnosis not present

## 2021-11-30 DIAGNOSIS — G959 Disease of spinal cord, unspecified: Secondary | ICD-10-CM | POA: Diagnosis not present

## 2021-11-30 DIAGNOSIS — R000 Tachycardia, unspecified: Secondary | ICD-10-CM | POA: Diagnosis not present

## 2021-11-30 DIAGNOSIS — G894 Chronic pain syndrome: Secondary | ICD-10-CM | POA: Diagnosis not present

## 2021-11-30 DIAGNOSIS — M5033 Other cervical disc degeneration, cervicothoracic region: Secondary | ICD-10-CM | POA: Diagnosis not present

## 2021-11-30 DIAGNOSIS — M4712 Other spondylosis with myelopathy, cervical region: Secondary | ICD-10-CM | POA: Diagnosis not present

## 2021-11-30 DIAGNOSIS — R7989 Other specified abnormal findings of blood chemistry: Secondary | ICD-10-CM | POA: Diagnosis not present

## 2021-11-30 DIAGNOSIS — R627 Adult failure to thrive: Secondary | ICD-10-CM | POA: Diagnosis not present

## 2021-11-30 DIAGNOSIS — I10 Essential (primary) hypertension: Secondary | ICD-10-CM | POA: Diagnosis not present

## 2021-11-30 DIAGNOSIS — M4312 Spondylolisthesis, cervical region: Secondary | ICD-10-CM | POA: Diagnosis not present

## 2021-11-30 DIAGNOSIS — D649 Anemia, unspecified: Secondary | ICD-10-CM | POA: Diagnosis not present

## 2021-11-30 DIAGNOSIS — M47813 Spondylosis without myelopathy or radiculopathy, cervicothoracic region: Secondary | ICD-10-CM | POA: Diagnosis not present

## 2021-12-01 DIAGNOSIS — E870 Hyperosmolality and hypernatremia: Secondary | ICD-10-CM | POA: Diagnosis not present

## 2021-12-01 DIAGNOSIS — R2689 Other abnormalities of gait and mobility: Secondary | ICD-10-CM | POA: Diagnosis not present

## 2021-12-01 DIAGNOSIS — G629 Polyneuropathy, unspecified: Secondary | ICD-10-CM | POA: Diagnosis not present

## 2021-12-01 DIAGNOSIS — N3000 Acute cystitis without hematuria: Secondary | ICD-10-CM | POA: Diagnosis not present

## 2021-12-01 DIAGNOSIS — E860 Dehydration: Secondary | ICD-10-CM | POA: Diagnosis not present

## 2021-12-01 DIAGNOSIS — Z20822 Contact with and (suspected) exposure to covid-19: Secondary | ICD-10-CM | POA: Diagnosis not present

## 2021-12-01 DIAGNOSIS — G4700 Insomnia, unspecified: Secondary | ICD-10-CM | POA: Diagnosis not present

## 2021-12-01 DIAGNOSIS — R531 Weakness: Secondary | ICD-10-CM | POA: Diagnosis not present

## 2021-12-01 DIAGNOSIS — G959 Disease of spinal cord, unspecified: Secondary | ICD-10-CM | POA: Diagnosis not present

## 2021-12-01 DIAGNOSIS — B954 Other streptococcus as the cause of diseases classified elsewhere: Secondary | ICD-10-CM | POA: Diagnosis not present

## 2021-12-01 DIAGNOSIS — R627 Adult failure to thrive: Secondary | ICD-10-CM | POA: Diagnosis not present

## 2021-12-01 DIAGNOSIS — M47812 Spondylosis without myelopathy or radiculopathy, cervical region: Secondary | ICD-10-CM | POA: Diagnosis not present

## 2021-12-01 DIAGNOSIS — G894 Chronic pain syndrome: Secondary | ICD-10-CM | POA: Diagnosis not present

## 2021-12-01 DIAGNOSIS — B952 Enterococcus as the cause of diseases classified elsewhere: Secondary | ICD-10-CM | POA: Diagnosis not present

## 2021-12-01 DIAGNOSIS — E876 Hypokalemia: Secondary | ICD-10-CM | POA: Diagnosis not present

## 2021-12-01 DIAGNOSIS — M4712 Other spondylosis with myelopathy, cervical region: Secondary | ICD-10-CM | POA: Diagnosis not present

## 2021-12-01 DIAGNOSIS — M4802 Spinal stenosis, cervical region: Secondary | ICD-10-CM | POA: Diagnosis not present

## 2021-12-01 DIAGNOSIS — E785 Hyperlipidemia, unspecified: Secondary | ICD-10-CM | POA: Diagnosis not present

## 2021-12-01 DIAGNOSIS — E538 Deficiency of other specified B group vitamins: Secondary | ICD-10-CM | POA: Diagnosis not present

## 2021-12-01 DIAGNOSIS — D649 Anemia, unspecified: Secondary | ICD-10-CM | POA: Diagnosis not present

## 2021-12-01 DIAGNOSIS — I10 Essential (primary) hypertension: Secondary | ICD-10-CM | POA: Diagnosis not present

## 2021-12-01 DIAGNOSIS — R7989 Other specified abnormal findings of blood chemistry: Secondary | ICD-10-CM | POA: Diagnosis not present

## 2021-12-01 DIAGNOSIS — R000 Tachycardia, unspecified: Secondary | ICD-10-CM | POA: Diagnosis not present

## 2021-12-02 DIAGNOSIS — R627 Adult failure to thrive: Secondary | ICD-10-CM | POA: Diagnosis not present

## 2021-12-02 DIAGNOSIS — E870 Hyperosmolality and hypernatremia: Secondary | ICD-10-CM | POA: Diagnosis not present

## 2021-12-02 DIAGNOSIS — R7989 Other specified abnormal findings of blood chemistry: Secondary | ICD-10-CM | POA: Diagnosis not present

## 2021-12-02 DIAGNOSIS — M4802 Spinal stenosis, cervical region: Secondary | ICD-10-CM | POA: Diagnosis not present

## 2021-12-02 DIAGNOSIS — R000 Tachycardia, unspecified: Secondary | ICD-10-CM | POA: Diagnosis not present

## 2021-12-02 DIAGNOSIS — G4700 Insomnia, unspecified: Secondary | ICD-10-CM | POA: Diagnosis not present

## 2021-12-02 DIAGNOSIS — G894 Chronic pain syndrome: Secondary | ICD-10-CM | POA: Diagnosis not present

## 2021-12-02 DIAGNOSIS — I10 Essential (primary) hypertension: Secondary | ICD-10-CM | POA: Diagnosis not present

## 2021-12-02 DIAGNOSIS — D649 Anemia, unspecified: Secondary | ICD-10-CM | POA: Diagnosis not present

## 2021-12-02 DIAGNOSIS — E860 Dehydration: Secondary | ICD-10-CM | POA: Diagnosis not present

## 2021-12-02 DIAGNOSIS — M4712 Other spondylosis with myelopathy, cervical region: Secondary | ICD-10-CM | POA: Diagnosis not present

## 2021-12-02 DIAGNOSIS — Z20822 Contact with and (suspected) exposure to covid-19: Secondary | ICD-10-CM | POA: Diagnosis not present

## 2021-12-02 DIAGNOSIS — N3000 Acute cystitis without hematuria: Secondary | ICD-10-CM | POA: Diagnosis not present

## 2021-12-02 DIAGNOSIS — G959 Disease of spinal cord, unspecified: Secondary | ICD-10-CM | POA: Diagnosis not present

## 2021-12-02 DIAGNOSIS — B954 Other streptococcus as the cause of diseases classified elsewhere: Secondary | ICD-10-CM | POA: Diagnosis not present

## 2021-12-02 DIAGNOSIS — E876 Hypokalemia: Secondary | ICD-10-CM | POA: Diagnosis not present

## 2021-12-02 DIAGNOSIS — G629 Polyneuropathy, unspecified: Secondary | ICD-10-CM | POA: Diagnosis not present

## 2021-12-02 DIAGNOSIS — B952 Enterococcus as the cause of diseases classified elsewhere: Secondary | ICD-10-CM | POA: Diagnosis not present

## 2021-12-02 DIAGNOSIS — E538 Deficiency of other specified B group vitamins: Secondary | ICD-10-CM | POA: Diagnosis not present

## 2021-12-02 DIAGNOSIS — R531 Weakness: Secondary | ICD-10-CM | POA: Diagnosis not present

## 2021-12-02 DIAGNOSIS — E785 Hyperlipidemia, unspecified: Secondary | ICD-10-CM | POA: Diagnosis not present

## 2021-12-02 DIAGNOSIS — R2689 Other abnormalities of gait and mobility: Secondary | ICD-10-CM | POA: Diagnosis not present

## 2021-12-02 DIAGNOSIS — M47812 Spondylosis without myelopathy or radiculopathy, cervical region: Secondary | ICD-10-CM | POA: Diagnosis not present

## 2021-12-03 DIAGNOSIS — R627 Adult failure to thrive: Secondary | ICD-10-CM | POA: Diagnosis not present

## 2021-12-03 DIAGNOSIS — Z20822 Contact with and (suspected) exposure to covid-19: Secondary | ICD-10-CM | POA: Diagnosis not present

## 2021-12-03 DIAGNOSIS — E870 Hyperosmolality and hypernatremia: Secondary | ICD-10-CM | POA: Diagnosis not present

## 2021-12-03 DIAGNOSIS — I10 Essential (primary) hypertension: Secondary | ICD-10-CM | POA: Diagnosis not present

## 2021-12-03 DIAGNOSIS — R000 Tachycardia, unspecified: Secondary | ICD-10-CM | POA: Diagnosis not present

## 2021-12-03 DIAGNOSIS — R7989 Other specified abnormal findings of blood chemistry: Secondary | ICD-10-CM | POA: Diagnosis not present

## 2021-12-03 DIAGNOSIS — G959 Disease of spinal cord, unspecified: Secondary | ICD-10-CM | POA: Diagnosis not present

## 2021-12-03 DIAGNOSIS — E538 Deficiency of other specified B group vitamins: Secondary | ICD-10-CM | POA: Diagnosis not present

## 2021-12-03 DIAGNOSIS — E785 Hyperlipidemia, unspecified: Secondary | ICD-10-CM | POA: Diagnosis not present

## 2021-12-03 DIAGNOSIS — M4802 Spinal stenosis, cervical region: Secondary | ICD-10-CM | POA: Diagnosis not present

## 2021-12-03 DIAGNOSIS — M4712 Other spondylosis with myelopathy, cervical region: Secondary | ICD-10-CM | POA: Diagnosis not present

## 2021-12-03 DIAGNOSIS — G629 Polyneuropathy, unspecified: Secondary | ICD-10-CM | POA: Diagnosis not present

## 2021-12-03 DIAGNOSIS — N3000 Acute cystitis without hematuria: Secondary | ICD-10-CM | POA: Diagnosis not present

## 2021-12-03 DIAGNOSIS — D649 Anemia, unspecified: Secondary | ICD-10-CM | POA: Diagnosis not present

## 2021-12-03 DIAGNOSIS — B954 Other streptococcus as the cause of diseases classified elsewhere: Secondary | ICD-10-CM | POA: Diagnosis not present

## 2021-12-03 DIAGNOSIS — E860 Dehydration: Secondary | ICD-10-CM | POA: Diagnosis not present

## 2021-12-03 DIAGNOSIS — M47812 Spondylosis without myelopathy or radiculopathy, cervical region: Secondary | ICD-10-CM | POA: Diagnosis not present

## 2021-12-03 DIAGNOSIS — R2689 Other abnormalities of gait and mobility: Secondary | ICD-10-CM | POA: Diagnosis not present

## 2021-12-03 DIAGNOSIS — G4700 Insomnia, unspecified: Secondary | ICD-10-CM | POA: Diagnosis not present

## 2021-12-03 DIAGNOSIS — G894 Chronic pain syndrome: Secondary | ICD-10-CM | POA: Diagnosis not present

## 2021-12-03 DIAGNOSIS — B952 Enterococcus as the cause of diseases classified elsewhere: Secondary | ICD-10-CM | POA: Diagnosis not present

## 2021-12-03 DIAGNOSIS — R531 Weakness: Secondary | ICD-10-CM | POA: Diagnosis not present

## 2021-12-03 DIAGNOSIS — E876 Hypokalemia: Secondary | ICD-10-CM | POA: Diagnosis not present

## 2021-12-04 DIAGNOSIS — I10 Essential (primary) hypertension: Secondary | ICD-10-CM | POA: Diagnosis not present

## 2021-12-04 DIAGNOSIS — G894 Chronic pain syndrome: Secondary | ICD-10-CM | POA: Diagnosis not present

## 2021-12-04 DIAGNOSIS — R2689 Other abnormalities of gait and mobility: Secondary | ICD-10-CM | POA: Diagnosis not present

## 2021-12-04 DIAGNOSIS — M47812 Spondylosis without myelopathy or radiculopathy, cervical region: Secondary | ICD-10-CM | POA: Diagnosis not present

## 2021-12-04 DIAGNOSIS — E538 Deficiency of other specified B group vitamins: Secondary | ICD-10-CM | POA: Diagnosis not present

## 2021-12-04 DIAGNOSIS — Z981 Arthrodesis status: Secondary | ICD-10-CM | POA: Diagnosis not present

## 2021-12-04 DIAGNOSIS — R531 Weakness: Secondary | ICD-10-CM | POA: Diagnosis not present

## 2021-12-04 DIAGNOSIS — R000 Tachycardia, unspecified: Secondary | ICD-10-CM | POA: Diagnosis not present

## 2021-12-04 DIAGNOSIS — E785 Hyperlipidemia, unspecified: Secondary | ICD-10-CM | POA: Diagnosis not present

## 2021-12-04 DIAGNOSIS — B952 Enterococcus as the cause of diseases classified elsewhere: Secondary | ICD-10-CM | POA: Diagnosis not present

## 2021-12-04 DIAGNOSIS — D649 Anemia, unspecified: Secondary | ICD-10-CM | POA: Diagnosis not present

## 2021-12-04 DIAGNOSIS — E870 Hyperosmolality and hypernatremia: Secondary | ICD-10-CM | POA: Diagnosis not present

## 2021-12-04 DIAGNOSIS — E860 Dehydration: Secondary | ICD-10-CM | POA: Diagnosis not present

## 2021-12-04 DIAGNOSIS — G4700 Insomnia, unspecified: Secondary | ICD-10-CM | POA: Diagnosis not present

## 2021-12-04 DIAGNOSIS — E876 Hypokalemia: Secondary | ICD-10-CM | POA: Diagnosis not present

## 2021-12-04 DIAGNOSIS — Z720 Tobacco use: Secondary | ICD-10-CM | POA: Diagnosis not present

## 2021-12-04 DIAGNOSIS — G959 Disease of spinal cord, unspecified: Secondary | ICD-10-CM | POA: Diagnosis not present

## 2021-12-04 DIAGNOSIS — G629 Polyneuropathy, unspecified: Secondary | ICD-10-CM | POA: Diagnosis not present

## 2021-12-04 DIAGNOSIS — B954 Other streptococcus as the cause of diseases classified elsewhere: Secondary | ICD-10-CM | POA: Diagnosis not present

## 2021-12-04 DIAGNOSIS — R7989 Other specified abnormal findings of blood chemistry: Secondary | ICD-10-CM | POA: Diagnosis not present

## 2021-12-04 DIAGNOSIS — M4802 Spinal stenosis, cervical region: Secondary | ICD-10-CM | POA: Diagnosis not present

## 2021-12-04 DIAGNOSIS — N3000 Acute cystitis without hematuria: Secondary | ICD-10-CM | POA: Diagnosis not present

## 2021-12-04 DIAGNOSIS — M4712 Other spondylosis with myelopathy, cervical region: Secondary | ICD-10-CM | POA: Diagnosis not present

## 2021-12-04 DIAGNOSIS — Z6824 Body mass index (BMI) 24.0-24.9, adult: Secondary | ICD-10-CM | POA: Diagnosis not present

## 2021-12-04 DIAGNOSIS — R627 Adult failure to thrive: Secondary | ICD-10-CM | POA: Diagnosis not present

## 2021-12-04 DIAGNOSIS — Z20822 Contact with and (suspected) exposure to covid-19: Secondary | ICD-10-CM | POA: Diagnosis not present

## 2021-12-05 DIAGNOSIS — G894 Chronic pain syndrome: Secondary | ICD-10-CM | POA: Diagnosis not present

## 2021-12-05 DIAGNOSIS — G4700 Insomnia, unspecified: Secondary | ICD-10-CM | POA: Diagnosis not present

## 2021-12-05 DIAGNOSIS — M47812 Spondylosis without myelopathy or radiculopathy, cervical region: Secondary | ICD-10-CM | POA: Diagnosis not present

## 2021-12-05 DIAGNOSIS — E870 Hyperosmolality and hypernatremia: Secondary | ICD-10-CM | POA: Diagnosis not present

## 2021-12-05 DIAGNOSIS — Z6824 Body mass index (BMI) 24.0-24.9, adult: Secondary | ICD-10-CM | POA: Diagnosis not present

## 2021-12-05 DIAGNOSIS — N3000 Acute cystitis without hematuria: Secondary | ICD-10-CM | POA: Diagnosis not present

## 2021-12-05 DIAGNOSIS — B954 Other streptococcus as the cause of diseases classified elsewhere: Secondary | ICD-10-CM | POA: Diagnosis not present

## 2021-12-05 DIAGNOSIS — R7989 Other specified abnormal findings of blood chemistry: Secondary | ICD-10-CM | POA: Diagnosis not present

## 2021-12-05 DIAGNOSIS — G959 Disease of spinal cord, unspecified: Secondary | ICD-10-CM | POA: Diagnosis not present

## 2021-12-05 DIAGNOSIS — Z981 Arthrodesis status: Secondary | ICD-10-CM | POA: Diagnosis not present

## 2021-12-05 DIAGNOSIS — R2689 Other abnormalities of gait and mobility: Secondary | ICD-10-CM | POA: Diagnosis not present

## 2021-12-05 DIAGNOSIS — Z20822 Contact with and (suspected) exposure to covid-19: Secondary | ICD-10-CM | POA: Diagnosis not present

## 2021-12-05 DIAGNOSIS — M4712 Other spondylosis with myelopathy, cervical region: Secondary | ICD-10-CM | POA: Diagnosis not present

## 2021-12-05 DIAGNOSIS — R531 Weakness: Secondary | ICD-10-CM | POA: Diagnosis not present

## 2021-12-05 DIAGNOSIS — R000 Tachycardia, unspecified: Secondary | ICD-10-CM | POA: Diagnosis not present

## 2021-12-05 DIAGNOSIS — I10 Essential (primary) hypertension: Secondary | ICD-10-CM | POA: Diagnosis not present

## 2021-12-05 DIAGNOSIS — E785 Hyperlipidemia, unspecified: Secondary | ICD-10-CM | POA: Diagnosis not present

## 2021-12-05 DIAGNOSIS — E876 Hypokalemia: Secondary | ICD-10-CM | POA: Diagnosis not present

## 2021-12-05 DIAGNOSIS — M4802 Spinal stenosis, cervical region: Secondary | ICD-10-CM | POA: Diagnosis not present

## 2021-12-05 DIAGNOSIS — E860 Dehydration: Secondary | ICD-10-CM | POA: Diagnosis not present

## 2021-12-05 DIAGNOSIS — E538 Deficiency of other specified B group vitamins: Secondary | ICD-10-CM | POA: Diagnosis not present

## 2021-12-05 DIAGNOSIS — B952 Enterococcus as the cause of diseases classified elsewhere: Secondary | ICD-10-CM | POA: Diagnosis not present

## 2021-12-05 DIAGNOSIS — D649 Anemia, unspecified: Secondary | ICD-10-CM | POA: Diagnosis not present

## 2021-12-05 DIAGNOSIS — G629 Polyneuropathy, unspecified: Secondary | ICD-10-CM | POA: Diagnosis not present

## 2021-12-05 DIAGNOSIS — R627 Adult failure to thrive: Secondary | ICD-10-CM | POA: Diagnosis not present

## 2021-12-05 DIAGNOSIS — Z720 Tobacco use: Secondary | ICD-10-CM | POA: Diagnosis not present

## 2021-12-06 DIAGNOSIS — R2689 Other abnormalities of gait and mobility: Secondary | ICD-10-CM | POA: Diagnosis not present

## 2021-12-06 DIAGNOSIS — R531 Weakness: Secondary | ICD-10-CM | POA: Diagnosis not present

## 2021-12-06 DIAGNOSIS — Z6824 Body mass index (BMI) 24.0-24.9, adult: Secondary | ICD-10-CM | POA: Diagnosis not present

## 2021-12-06 DIAGNOSIS — Z20822 Contact with and (suspected) exposure to covid-19: Secondary | ICD-10-CM | POA: Diagnosis not present

## 2021-12-06 DIAGNOSIS — E785 Hyperlipidemia, unspecified: Secondary | ICD-10-CM | POA: Diagnosis not present

## 2021-12-06 DIAGNOSIS — E538 Deficiency of other specified B group vitamins: Secondary | ICD-10-CM | POA: Diagnosis not present

## 2021-12-06 DIAGNOSIS — R000 Tachycardia, unspecified: Secondary | ICD-10-CM | POA: Diagnosis not present

## 2021-12-06 DIAGNOSIS — E860 Dehydration: Secondary | ICD-10-CM | POA: Diagnosis not present

## 2021-12-06 DIAGNOSIS — B952 Enterococcus as the cause of diseases classified elsewhere: Secondary | ICD-10-CM | POA: Diagnosis not present

## 2021-12-06 DIAGNOSIS — M4712 Other spondylosis with myelopathy, cervical region: Secondary | ICD-10-CM | POA: Diagnosis not present

## 2021-12-06 DIAGNOSIS — R7989 Other specified abnormal findings of blood chemistry: Secondary | ICD-10-CM | POA: Diagnosis not present

## 2021-12-06 DIAGNOSIS — I10 Essential (primary) hypertension: Secondary | ICD-10-CM | POA: Diagnosis not present

## 2021-12-06 DIAGNOSIS — M47812 Spondylosis without myelopathy or radiculopathy, cervical region: Secondary | ICD-10-CM | POA: Diagnosis not present

## 2021-12-06 DIAGNOSIS — E876 Hypokalemia: Secondary | ICD-10-CM | POA: Diagnosis not present

## 2021-12-06 DIAGNOSIS — G629 Polyneuropathy, unspecified: Secondary | ICD-10-CM | POA: Diagnosis not present

## 2021-12-06 DIAGNOSIS — G4700 Insomnia, unspecified: Secondary | ICD-10-CM | POA: Diagnosis not present

## 2021-12-06 DIAGNOSIS — Z720 Tobacco use: Secondary | ICD-10-CM | POA: Diagnosis not present

## 2021-12-06 DIAGNOSIS — G894 Chronic pain syndrome: Secondary | ICD-10-CM | POA: Diagnosis not present

## 2021-12-06 DIAGNOSIS — B954 Other streptococcus as the cause of diseases classified elsewhere: Secondary | ICD-10-CM | POA: Diagnosis not present

## 2021-12-06 DIAGNOSIS — Z981 Arthrodesis status: Secondary | ICD-10-CM | POA: Diagnosis not present

## 2021-12-06 DIAGNOSIS — D649 Anemia, unspecified: Secondary | ICD-10-CM | POA: Diagnosis not present

## 2021-12-06 DIAGNOSIS — E870 Hyperosmolality and hypernatremia: Secondary | ICD-10-CM | POA: Diagnosis not present

## 2021-12-06 DIAGNOSIS — R627 Adult failure to thrive: Secondary | ICD-10-CM | POA: Diagnosis not present

## 2021-12-06 DIAGNOSIS — G959 Disease of spinal cord, unspecified: Secondary | ICD-10-CM | POA: Diagnosis not present

## 2021-12-06 DIAGNOSIS — M4802 Spinal stenosis, cervical region: Secondary | ICD-10-CM | POA: Diagnosis not present

## 2021-12-06 DIAGNOSIS — N3000 Acute cystitis without hematuria: Secondary | ICD-10-CM | POA: Diagnosis not present

## 2021-12-07 DIAGNOSIS — Z48811 Encounter for surgical aftercare following surgery on the nervous system: Secondary | ICD-10-CM | POA: Diagnosis not present

## 2021-12-07 DIAGNOSIS — G959 Disease of spinal cord, unspecified: Secondary | ICD-10-CM | POA: Diagnosis not present

## 2021-12-07 DIAGNOSIS — E860 Dehydration: Secondary | ICD-10-CM | POA: Diagnosis not present

## 2021-12-07 DIAGNOSIS — Z981 Arthrodesis status: Secondary | ICD-10-CM | POA: Diagnosis not present

## 2021-12-07 DIAGNOSIS — R296 Repeated falls: Secondary | ICD-10-CM | POA: Diagnosis not present

## 2021-12-07 DIAGNOSIS — E538 Deficiency of other specified B group vitamins: Secondary | ICD-10-CM | POA: Diagnosis not present

## 2021-12-07 DIAGNOSIS — Z720 Tobacco use: Secondary | ICD-10-CM | POA: Diagnosis not present

## 2021-12-07 DIAGNOSIS — R627 Adult failure to thrive: Secondary | ICD-10-CM | POA: Diagnosis not present

## 2021-12-07 DIAGNOSIS — E785 Hyperlipidemia, unspecified: Secondary | ICD-10-CM | POA: Diagnosis not present

## 2021-12-07 DIAGNOSIS — R000 Tachycardia, unspecified: Secondary | ICD-10-CM | POA: Diagnosis not present

## 2021-12-07 DIAGNOSIS — G629 Polyneuropathy, unspecified: Secondary | ICD-10-CM | POA: Diagnosis not present

## 2021-12-07 DIAGNOSIS — D649 Anemia, unspecified: Secondary | ICD-10-CM | POA: Diagnosis not present

## 2021-12-07 DIAGNOSIS — M47812 Spondylosis without myelopathy or radiculopathy, cervical region: Secondary | ICD-10-CM | POA: Diagnosis not present

## 2021-12-07 DIAGNOSIS — R531 Weakness: Secondary | ICD-10-CM | POA: Diagnosis not present

## 2021-12-07 DIAGNOSIS — Z20822 Contact with and (suspected) exposure to covid-19: Secondary | ICD-10-CM | POA: Diagnosis not present

## 2021-12-07 DIAGNOSIS — I10 Essential (primary) hypertension: Secondary | ICD-10-CM | POA: Diagnosis not present

## 2021-12-07 DIAGNOSIS — R2689 Other abnormalities of gait and mobility: Secondary | ICD-10-CM | POA: Diagnosis not present

## 2021-12-07 DIAGNOSIS — E870 Hyperosmolality and hypernatremia: Secondary | ICD-10-CM | POA: Diagnosis not present

## 2021-12-07 DIAGNOSIS — M4712 Other spondylosis with myelopathy, cervical region: Secondary | ICD-10-CM | POA: Diagnosis not present

## 2021-12-07 DIAGNOSIS — E876 Hypokalemia: Secondary | ICD-10-CM | POA: Diagnosis not present

## 2021-12-07 DIAGNOSIS — G894 Chronic pain syndrome: Secondary | ICD-10-CM | POA: Diagnosis not present

## 2021-12-07 DIAGNOSIS — B954 Other streptococcus as the cause of diseases classified elsewhere: Secondary | ICD-10-CM | POA: Diagnosis not present

## 2021-12-07 DIAGNOSIS — Z743 Need for continuous supervision: Secondary | ICD-10-CM | POA: Diagnosis not present

## 2021-12-07 DIAGNOSIS — N3000 Acute cystitis without hematuria: Secondary | ICD-10-CM | POA: Diagnosis not present

## 2021-12-07 DIAGNOSIS — B952 Enterococcus as the cause of diseases classified elsewhere: Secondary | ICD-10-CM | POA: Diagnosis not present

## 2021-12-07 DIAGNOSIS — R7989 Other specified abnormal findings of blood chemistry: Secondary | ICD-10-CM | POA: Diagnosis not present

## 2021-12-07 DIAGNOSIS — U071 COVID-19: Secondary | ICD-10-CM | POA: Diagnosis not present

## 2021-12-07 DIAGNOSIS — M1990 Unspecified osteoarthritis, unspecified site: Secondary | ICD-10-CM | POA: Diagnosis not present

## 2021-12-07 DIAGNOSIS — M6281 Muscle weakness (generalized): Secondary | ICD-10-CM | POA: Diagnosis not present

## 2021-12-07 DIAGNOSIS — G4700 Insomnia, unspecified: Secondary | ICD-10-CM | POA: Diagnosis not present

## 2021-12-07 DIAGNOSIS — Z6824 Body mass index (BMI) 24.0-24.9, adult: Secondary | ICD-10-CM | POA: Diagnosis not present

## 2021-12-07 DIAGNOSIS — N390 Urinary tract infection, site not specified: Secondary | ICD-10-CM | POA: Diagnosis not present

## 2021-12-07 DIAGNOSIS — M4802 Spinal stenosis, cervical region: Secondary | ICD-10-CM | POA: Diagnosis not present

## 2021-12-08 DIAGNOSIS — G959 Disease of spinal cord, unspecified: Secondary | ICD-10-CM | POA: Diagnosis not present

## 2021-12-08 DIAGNOSIS — I10 Essential (primary) hypertension: Secondary | ICD-10-CM | POA: Diagnosis not present

## 2022-01-02 DIAGNOSIS — Z981 Arthrodesis status: Secondary | ICD-10-CM | POA: Diagnosis not present

## 2022-01-02 DIAGNOSIS — Z4789 Encounter for other orthopedic aftercare: Secondary | ICD-10-CM | POA: Diagnosis not present

## 2022-01-02 DIAGNOSIS — M47812 Spondylosis without myelopathy or radiculopathy, cervical region: Secondary | ICD-10-CM | POA: Diagnosis not present

## 2022-01-05 DIAGNOSIS — I10 Essential (primary) hypertension: Secondary | ICD-10-CM | POA: Diagnosis not present

## 2022-01-05 DIAGNOSIS — G959 Disease of spinal cord, unspecified: Secondary | ICD-10-CM | POA: Diagnosis not present

## 2022-01-09 DIAGNOSIS — R270 Ataxia, unspecified: Secondary | ICD-10-CM | POA: Diagnosis not present

## 2022-01-09 DIAGNOSIS — R293 Abnormal posture: Secondary | ICD-10-CM | POA: Diagnosis not present

## 2022-01-09 DIAGNOSIS — M6281 Muscle weakness (generalized): Secondary | ICD-10-CM | POA: Diagnosis not present

## 2022-01-09 DIAGNOSIS — Z48811 Encounter for surgical aftercare following surgery on the nervous system: Secondary | ICD-10-CM | POA: Diagnosis not present

## 2022-01-09 DIAGNOSIS — R2689 Other abnormalities of gait and mobility: Secondary | ICD-10-CM | POA: Diagnosis not present

## 2022-01-10 DIAGNOSIS — R2689 Other abnormalities of gait and mobility: Secondary | ICD-10-CM | POA: Diagnosis not present

## 2022-01-10 DIAGNOSIS — R270 Ataxia, unspecified: Secondary | ICD-10-CM | POA: Diagnosis not present

## 2022-01-10 DIAGNOSIS — Z48811 Encounter for surgical aftercare following surgery on the nervous system: Secondary | ICD-10-CM | POA: Diagnosis not present

## 2022-01-10 DIAGNOSIS — M6281 Muscle weakness (generalized): Secondary | ICD-10-CM | POA: Diagnosis not present

## 2022-01-10 DIAGNOSIS — R293 Abnormal posture: Secondary | ICD-10-CM | POA: Diagnosis not present

## 2022-01-11 DIAGNOSIS — R293 Abnormal posture: Secondary | ICD-10-CM | POA: Diagnosis not present

## 2022-01-11 DIAGNOSIS — R270 Ataxia, unspecified: Secondary | ICD-10-CM | POA: Diagnosis not present

## 2022-01-11 DIAGNOSIS — R2689 Other abnormalities of gait and mobility: Secondary | ICD-10-CM | POA: Diagnosis not present

## 2022-01-11 DIAGNOSIS — Z48811 Encounter for surgical aftercare following surgery on the nervous system: Secondary | ICD-10-CM | POA: Diagnosis not present

## 2022-01-11 DIAGNOSIS — M6281 Muscle weakness (generalized): Secondary | ICD-10-CM | POA: Diagnosis not present

## 2022-01-12 DIAGNOSIS — R270 Ataxia, unspecified: Secondary | ICD-10-CM | POA: Diagnosis not present

## 2022-01-12 DIAGNOSIS — R293 Abnormal posture: Secondary | ICD-10-CM | POA: Diagnosis not present

## 2022-01-12 DIAGNOSIS — M6281 Muscle weakness (generalized): Secondary | ICD-10-CM | POA: Diagnosis not present

## 2022-01-12 DIAGNOSIS — R2689 Other abnormalities of gait and mobility: Secondary | ICD-10-CM | POA: Diagnosis not present

## 2022-01-12 DIAGNOSIS — Z48811 Encounter for surgical aftercare following surgery on the nervous system: Secondary | ICD-10-CM | POA: Diagnosis not present

## 2022-01-13 DIAGNOSIS — Z48811 Encounter for surgical aftercare following surgery on the nervous system: Secondary | ICD-10-CM | POA: Diagnosis not present

## 2022-01-13 DIAGNOSIS — R293 Abnormal posture: Secondary | ICD-10-CM | POA: Diagnosis not present

## 2022-01-13 DIAGNOSIS — R270 Ataxia, unspecified: Secondary | ICD-10-CM | POA: Diagnosis not present

## 2022-01-13 DIAGNOSIS — M6281 Muscle weakness (generalized): Secondary | ICD-10-CM | POA: Diagnosis not present

## 2022-01-13 DIAGNOSIS — R2689 Other abnormalities of gait and mobility: Secondary | ICD-10-CM | POA: Diagnosis not present

## 2022-01-15 DIAGNOSIS — R293 Abnormal posture: Secondary | ICD-10-CM | POA: Diagnosis not present

## 2022-01-15 DIAGNOSIS — M6281 Muscle weakness (generalized): Secondary | ICD-10-CM | POA: Diagnosis not present

## 2022-01-15 DIAGNOSIS — R2689 Other abnormalities of gait and mobility: Secondary | ICD-10-CM | POA: Diagnosis not present

## 2022-01-15 DIAGNOSIS — Z48811 Encounter for surgical aftercare following surgery on the nervous system: Secondary | ICD-10-CM | POA: Diagnosis not present

## 2022-01-15 DIAGNOSIS — R270 Ataxia, unspecified: Secondary | ICD-10-CM | POA: Diagnosis not present

## 2022-01-16 DIAGNOSIS — Z48811 Encounter for surgical aftercare following surgery on the nervous system: Secondary | ICD-10-CM | POA: Diagnosis not present

## 2022-01-16 DIAGNOSIS — R2689 Other abnormalities of gait and mobility: Secondary | ICD-10-CM | POA: Diagnosis not present

## 2022-01-16 DIAGNOSIS — R293 Abnormal posture: Secondary | ICD-10-CM | POA: Diagnosis not present

## 2022-01-16 DIAGNOSIS — M6281 Muscle weakness (generalized): Secondary | ICD-10-CM | POA: Diagnosis not present

## 2022-01-16 DIAGNOSIS — R270 Ataxia, unspecified: Secondary | ICD-10-CM | POA: Diagnosis not present

## 2022-01-17 DIAGNOSIS — M6281 Muscle weakness (generalized): Secondary | ICD-10-CM | POA: Diagnosis not present

## 2022-01-17 DIAGNOSIS — R293 Abnormal posture: Secondary | ICD-10-CM | POA: Diagnosis not present

## 2022-01-17 DIAGNOSIS — R270 Ataxia, unspecified: Secondary | ICD-10-CM | POA: Diagnosis not present

## 2022-01-17 DIAGNOSIS — Z48811 Encounter for surgical aftercare following surgery on the nervous system: Secondary | ICD-10-CM | POA: Diagnosis not present

## 2022-01-17 DIAGNOSIS — R2689 Other abnormalities of gait and mobility: Secondary | ICD-10-CM | POA: Diagnosis not present

## 2022-01-18 DIAGNOSIS — R2689 Other abnormalities of gait and mobility: Secondary | ICD-10-CM | POA: Diagnosis not present

## 2022-01-18 DIAGNOSIS — M6281 Muscle weakness (generalized): Secondary | ICD-10-CM | POA: Diagnosis not present

## 2022-01-18 DIAGNOSIS — R270 Ataxia, unspecified: Secondary | ICD-10-CM | POA: Diagnosis not present

## 2022-01-18 DIAGNOSIS — R293 Abnormal posture: Secondary | ICD-10-CM | POA: Diagnosis not present

## 2022-01-18 DIAGNOSIS — Z48811 Encounter for surgical aftercare following surgery on the nervous system: Secondary | ICD-10-CM | POA: Diagnosis not present

## 2022-01-19 DIAGNOSIS — M6281 Muscle weakness (generalized): Secondary | ICD-10-CM | POA: Diagnosis not present

## 2022-01-19 DIAGNOSIS — R293 Abnormal posture: Secondary | ICD-10-CM | POA: Diagnosis not present

## 2022-01-19 DIAGNOSIS — R2689 Other abnormalities of gait and mobility: Secondary | ICD-10-CM | POA: Diagnosis not present

## 2022-01-19 DIAGNOSIS — Z48811 Encounter for surgical aftercare following surgery on the nervous system: Secondary | ICD-10-CM | POA: Diagnosis not present

## 2022-01-19 DIAGNOSIS — R270 Ataxia, unspecified: Secondary | ICD-10-CM | POA: Diagnosis not present

## 2022-01-22 DIAGNOSIS — R293 Abnormal posture: Secondary | ICD-10-CM | POA: Diagnosis not present

## 2022-01-22 DIAGNOSIS — M6281 Muscle weakness (generalized): Secondary | ICD-10-CM | POA: Diagnosis not present

## 2022-01-22 DIAGNOSIS — Z48811 Encounter for surgical aftercare following surgery on the nervous system: Secondary | ICD-10-CM | POA: Diagnosis not present

## 2022-01-22 DIAGNOSIS — R2689 Other abnormalities of gait and mobility: Secondary | ICD-10-CM | POA: Diagnosis not present

## 2022-01-22 DIAGNOSIS — R270 Ataxia, unspecified: Secondary | ICD-10-CM | POA: Diagnosis not present

## 2022-01-23 DIAGNOSIS — R270 Ataxia, unspecified: Secondary | ICD-10-CM | POA: Diagnosis not present

## 2022-01-23 DIAGNOSIS — R293 Abnormal posture: Secondary | ICD-10-CM | POA: Diagnosis not present

## 2022-01-23 DIAGNOSIS — Z48811 Encounter for surgical aftercare following surgery on the nervous system: Secondary | ICD-10-CM | POA: Diagnosis not present

## 2022-01-23 DIAGNOSIS — R2689 Other abnormalities of gait and mobility: Secondary | ICD-10-CM | POA: Diagnosis not present

## 2022-01-23 DIAGNOSIS — M6281 Muscle weakness (generalized): Secondary | ICD-10-CM | POA: Diagnosis not present

## 2022-01-24 DIAGNOSIS — M4312 Spondylolisthesis, cervical region: Secondary | ICD-10-CM | POA: Diagnosis not present

## 2022-01-24 DIAGNOSIS — R2689 Other abnormalities of gait and mobility: Secondary | ICD-10-CM | POA: Diagnosis not present

## 2022-01-24 DIAGNOSIS — G959 Disease of spinal cord, unspecified: Secondary | ICD-10-CM | POA: Diagnosis not present

## 2022-01-24 DIAGNOSIS — Z9889 Other specified postprocedural states: Secondary | ICD-10-CM | POA: Diagnosis not present

## 2022-01-24 DIAGNOSIS — M5033 Other cervical disc degeneration, cervicothoracic region: Secondary | ICD-10-CM | POA: Diagnosis not present

## 2022-01-24 DIAGNOSIS — R270 Ataxia, unspecified: Secondary | ICD-10-CM | POA: Diagnosis not present

## 2022-01-24 DIAGNOSIS — M5031 Other cervical disc degeneration,  high cervical region: Secondary | ICD-10-CM | POA: Diagnosis not present

## 2022-01-24 DIAGNOSIS — R293 Abnormal posture: Secondary | ICD-10-CM | POA: Diagnosis not present

## 2022-01-24 DIAGNOSIS — M1990 Unspecified osteoarthritis, unspecified site: Secondary | ICD-10-CM | POA: Diagnosis not present

## 2022-01-24 DIAGNOSIS — U071 COVID-19: Secondary | ICD-10-CM | POA: Diagnosis not present

## 2022-01-24 DIAGNOSIS — R296 Repeated falls: Secondary | ICD-10-CM | POA: Diagnosis not present

## 2022-01-24 DIAGNOSIS — N390 Urinary tract infection, site not specified: Secondary | ICD-10-CM | POA: Diagnosis not present

## 2022-01-24 DIAGNOSIS — I10 Essential (primary) hypertension: Secondary | ICD-10-CM | POA: Diagnosis not present

## 2022-01-24 DIAGNOSIS — M6281 Muscle weakness (generalized): Secondary | ICD-10-CM | POA: Diagnosis not present

## 2022-01-24 DIAGNOSIS — G894 Chronic pain syndrome: Secondary | ICD-10-CM | POA: Diagnosis not present

## 2022-01-24 DIAGNOSIS — Z48811 Encounter for surgical aftercare following surgery on the nervous system: Secondary | ICD-10-CM | POA: Diagnosis not present

## 2022-01-24 DIAGNOSIS — M47812 Spondylosis without myelopathy or radiculopathy, cervical region: Secondary | ICD-10-CM | POA: Diagnosis not present

## 2022-02-20 DIAGNOSIS — M5031 Other cervical disc degeneration,  high cervical region: Secondary | ICD-10-CM | POA: Diagnosis not present

## 2022-02-20 DIAGNOSIS — M47812 Spondylosis without myelopathy or radiculopathy, cervical region: Secondary | ICD-10-CM | POA: Diagnosis not present

## 2022-02-20 DIAGNOSIS — M4312 Spondylolisthesis, cervical region: Secondary | ICD-10-CM | POA: Diagnosis not present

## 2022-02-20 DIAGNOSIS — M5033 Other cervical disc degeneration, cervicothoracic region: Secondary | ICD-10-CM | POA: Diagnosis not present

## 2022-02-20 DIAGNOSIS — G959 Disease of spinal cord, unspecified: Secondary | ICD-10-CM | POA: Diagnosis not present

## 2022-02-20 DIAGNOSIS — Z9889 Other specified postprocedural states: Secondary | ICD-10-CM | POA: Diagnosis not present

## 2022-02-21 DIAGNOSIS — R270 Ataxia, unspecified: Secondary | ICD-10-CM | POA: Diagnosis not present

## 2022-02-21 DIAGNOSIS — R2689 Other abnormalities of gait and mobility: Secondary | ICD-10-CM | POA: Diagnosis not present

## 2022-02-21 DIAGNOSIS — M6281 Muscle weakness (generalized): Secondary | ICD-10-CM | POA: Diagnosis not present

## 2022-02-21 DIAGNOSIS — R293 Abnormal posture: Secondary | ICD-10-CM | POA: Diagnosis not present

## 2022-02-21 DIAGNOSIS — Z48811 Encounter for surgical aftercare following surgery on the nervous system: Secondary | ICD-10-CM | POA: Diagnosis not present

## 2022-02-22 DIAGNOSIS — R293 Abnormal posture: Secondary | ICD-10-CM | POA: Diagnosis not present

## 2022-02-22 DIAGNOSIS — Z48811 Encounter for surgical aftercare following surgery on the nervous system: Secondary | ICD-10-CM | POA: Diagnosis not present

## 2022-02-22 DIAGNOSIS — M6281 Muscle weakness (generalized): Secondary | ICD-10-CM | POA: Diagnosis not present

## 2022-02-22 DIAGNOSIS — R2689 Other abnormalities of gait and mobility: Secondary | ICD-10-CM | POA: Diagnosis not present

## 2022-02-22 DIAGNOSIS — R270 Ataxia, unspecified: Secondary | ICD-10-CM | POA: Diagnosis not present

## 2022-02-23 DIAGNOSIS — R270 Ataxia, unspecified: Secondary | ICD-10-CM | POA: Diagnosis not present

## 2022-02-23 DIAGNOSIS — R293 Abnormal posture: Secondary | ICD-10-CM | POA: Diagnosis not present

## 2022-02-23 DIAGNOSIS — R2689 Other abnormalities of gait and mobility: Secondary | ICD-10-CM | POA: Diagnosis not present

## 2022-02-23 DIAGNOSIS — M6281 Muscle weakness (generalized): Secondary | ICD-10-CM | POA: Diagnosis not present

## 2022-02-23 DIAGNOSIS — Z48811 Encounter for surgical aftercare following surgery on the nervous system: Secondary | ICD-10-CM | POA: Diagnosis not present

## 2022-02-24 DIAGNOSIS — R293 Abnormal posture: Secondary | ICD-10-CM | POA: Diagnosis not present

## 2022-02-24 DIAGNOSIS — Z48811 Encounter for surgical aftercare following surgery on the nervous system: Secondary | ICD-10-CM | POA: Diagnosis not present

## 2022-02-24 DIAGNOSIS — R2689 Other abnormalities of gait and mobility: Secondary | ICD-10-CM | POA: Diagnosis not present

## 2022-02-24 DIAGNOSIS — M6281 Muscle weakness (generalized): Secondary | ICD-10-CM | POA: Diagnosis not present

## 2022-02-24 DIAGNOSIS — R270 Ataxia, unspecified: Secondary | ICD-10-CM | POA: Diagnosis not present

## 2022-02-26 DIAGNOSIS — R2689 Other abnormalities of gait and mobility: Secondary | ICD-10-CM | POA: Diagnosis not present

## 2022-02-26 DIAGNOSIS — M6281 Muscle weakness (generalized): Secondary | ICD-10-CM | POA: Diagnosis not present

## 2022-02-26 DIAGNOSIS — R270 Ataxia, unspecified: Secondary | ICD-10-CM | POA: Diagnosis not present

## 2022-02-26 DIAGNOSIS — R293 Abnormal posture: Secondary | ICD-10-CM | POA: Diagnosis not present

## 2022-02-26 DIAGNOSIS — Z48811 Encounter for surgical aftercare following surgery on the nervous system: Secondary | ICD-10-CM | POA: Diagnosis not present

## 2022-02-27 DIAGNOSIS — R293 Abnormal posture: Secondary | ICD-10-CM | POA: Diagnosis not present

## 2022-02-27 DIAGNOSIS — R2689 Other abnormalities of gait and mobility: Secondary | ICD-10-CM | POA: Diagnosis not present

## 2022-02-27 DIAGNOSIS — M6281 Muscle weakness (generalized): Secondary | ICD-10-CM | POA: Diagnosis not present

## 2022-02-27 DIAGNOSIS — Z981 Arthrodesis status: Secondary | ICD-10-CM | POA: Diagnosis not present

## 2022-02-27 DIAGNOSIS — R270 Ataxia, unspecified: Secondary | ICD-10-CM | POA: Diagnosis not present

## 2022-02-27 DIAGNOSIS — Z48811 Encounter for surgical aftercare following surgery on the nervous system: Secondary | ICD-10-CM | POA: Diagnosis not present

## 2022-03-22 DIAGNOSIS — G959 Disease of spinal cord, unspecified: Secondary | ICD-10-CM | POA: Diagnosis not present

## 2022-03-22 DIAGNOSIS — I10 Essential (primary) hypertension: Secondary | ICD-10-CM | POA: Diagnosis not present

## 2022-04-09 DIAGNOSIS — R1311 Dysphagia, oral phase: Secondary | ICD-10-CM | POA: Diagnosis not present

## 2022-04-09 DIAGNOSIS — M5000 Cervical disc disorder with myelopathy, unspecified cervical region: Secondary | ICD-10-CM | POA: Diagnosis not present

## 2022-04-09 DIAGNOSIS — R2689 Other abnormalities of gait and mobility: Secondary | ICD-10-CM | POA: Diagnosis not present

## 2022-04-09 DIAGNOSIS — Z48811 Encounter for surgical aftercare following surgery on the nervous system: Secondary | ICD-10-CM | POA: Diagnosis not present

## 2022-04-09 DIAGNOSIS — M6281 Muscle weakness (generalized): Secondary | ICD-10-CM | POA: Diagnosis not present

## 2022-04-09 DIAGNOSIS — R293 Abnormal posture: Secondary | ICD-10-CM | POA: Diagnosis not present

## 2022-04-10 DIAGNOSIS — M6281 Muscle weakness (generalized): Secondary | ICD-10-CM | POA: Diagnosis not present

## 2022-04-10 DIAGNOSIS — R2689 Other abnormalities of gait and mobility: Secondary | ICD-10-CM | POA: Diagnosis not present

## 2022-04-10 DIAGNOSIS — M5000 Cervical disc disorder with myelopathy, unspecified cervical region: Secondary | ICD-10-CM | POA: Diagnosis not present

## 2022-04-10 DIAGNOSIS — Z48811 Encounter for surgical aftercare following surgery on the nervous system: Secondary | ICD-10-CM | POA: Diagnosis not present

## 2022-04-10 DIAGNOSIS — R293 Abnormal posture: Secondary | ICD-10-CM | POA: Diagnosis not present

## 2022-04-10 DIAGNOSIS — R1311 Dysphagia, oral phase: Secondary | ICD-10-CM | POA: Diagnosis not present

## 2022-04-11 DIAGNOSIS — R1311 Dysphagia, oral phase: Secondary | ICD-10-CM | POA: Diagnosis not present

## 2022-04-11 DIAGNOSIS — R293 Abnormal posture: Secondary | ICD-10-CM | POA: Diagnosis not present

## 2022-04-11 DIAGNOSIS — M5000 Cervical disc disorder with myelopathy, unspecified cervical region: Secondary | ICD-10-CM | POA: Diagnosis not present

## 2022-04-11 DIAGNOSIS — R2689 Other abnormalities of gait and mobility: Secondary | ICD-10-CM | POA: Diagnosis not present

## 2022-04-11 DIAGNOSIS — Z48811 Encounter for surgical aftercare following surgery on the nervous system: Secondary | ICD-10-CM | POA: Diagnosis not present

## 2022-04-11 DIAGNOSIS — M6281 Muscle weakness (generalized): Secondary | ICD-10-CM | POA: Diagnosis not present

## 2022-04-12 DIAGNOSIS — M6281 Muscle weakness (generalized): Secondary | ICD-10-CM | POA: Diagnosis not present

## 2022-04-12 DIAGNOSIS — R1311 Dysphagia, oral phase: Secondary | ICD-10-CM | POA: Diagnosis not present

## 2022-04-12 DIAGNOSIS — M5000 Cervical disc disorder with myelopathy, unspecified cervical region: Secondary | ICD-10-CM | POA: Diagnosis not present

## 2022-04-12 DIAGNOSIS — R2689 Other abnormalities of gait and mobility: Secondary | ICD-10-CM | POA: Diagnosis not present

## 2022-04-12 DIAGNOSIS — Z48811 Encounter for surgical aftercare following surgery on the nervous system: Secondary | ICD-10-CM | POA: Diagnosis not present

## 2022-04-12 DIAGNOSIS — R293 Abnormal posture: Secondary | ICD-10-CM | POA: Diagnosis not present

## 2022-04-13 DIAGNOSIS — R1311 Dysphagia, oral phase: Secondary | ICD-10-CM | POA: Diagnosis not present

## 2022-04-13 DIAGNOSIS — M6281 Muscle weakness (generalized): Secondary | ICD-10-CM | POA: Diagnosis not present

## 2022-04-13 DIAGNOSIS — R293 Abnormal posture: Secondary | ICD-10-CM | POA: Diagnosis not present

## 2022-04-13 DIAGNOSIS — M5000 Cervical disc disorder with myelopathy, unspecified cervical region: Secondary | ICD-10-CM | POA: Diagnosis not present

## 2022-04-13 DIAGNOSIS — R2689 Other abnormalities of gait and mobility: Secondary | ICD-10-CM | POA: Diagnosis not present

## 2022-04-13 DIAGNOSIS — Z48811 Encounter for surgical aftercare following surgery on the nervous system: Secondary | ICD-10-CM | POA: Diagnosis not present

## 2022-04-16 DIAGNOSIS — R2689 Other abnormalities of gait and mobility: Secondary | ICD-10-CM | POA: Diagnosis not present

## 2022-04-16 DIAGNOSIS — M5000 Cervical disc disorder with myelopathy, unspecified cervical region: Secondary | ICD-10-CM | POA: Diagnosis not present

## 2022-04-16 DIAGNOSIS — M6281 Muscle weakness (generalized): Secondary | ICD-10-CM | POA: Diagnosis not present

## 2022-04-16 DIAGNOSIS — Z48811 Encounter for surgical aftercare following surgery on the nervous system: Secondary | ICD-10-CM | POA: Diagnosis not present

## 2022-04-16 DIAGNOSIS — R1311 Dysphagia, oral phase: Secondary | ICD-10-CM | POA: Diagnosis not present

## 2022-04-16 DIAGNOSIS — R293 Abnormal posture: Secondary | ICD-10-CM | POA: Diagnosis not present

## 2022-04-17 DIAGNOSIS — M6281 Muscle weakness (generalized): Secondary | ICD-10-CM | POA: Diagnosis not present

## 2022-04-17 DIAGNOSIS — R1311 Dysphagia, oral phase: Secondary | ICD-10-CM | POA: Diagnosis not present

## 2022-04-17 DIAGNOSIS — M5000 Cervical disc disorder with myelopathy, unspecified cervical region: Secondary | ICD-10-CM | POA: Diagnosis not present

## 2022-04-17 DIAGNOSIS — Z48811 Encounter for surgical aftercare following surgery on the nervous system: Secondary | ICD-10-CM | POA: Diagnosis not present

## 2022-04-17 DIAGNOSIS — R2689 Other abnormalities of gait and mobility: Secondary | ICD-10-CM | POA: Diagnosis not present

## 2022-04-17 DIAGNOSIS — R293 Abnormal posture: Secondary | ICD-10-CM | POA: Diagnosis not present

## 2022-04-18 DIAGNOSIS — Z48811 Encounter for surgical aftercare following surgery on the nervous system: Secondary | ICD-10-CM | POA: Diagnosis not present

## 2022-04-18 DIAGNOSIS — R2689 Other abnormalities of gait and mobility: Secondary | ICD-10-CM | POA: Diagnosis not present

## 2022-04-18 DIAGNOSIS — R1311 Dysphagia, oral phase: Secondary | ICD-10-CM | POA: Diagnosis not present

## 2022-04-18 DIAGNOSIS — M5000 Cervical disc disorder with myelopathy, unspecified cervical region: Secondary | ICD-10-CM | POA: Diagnosis not present

## 2022-04-18 DIAGNOSIS — M6281 Muscle weakness (generalized): Secondary | ICD-10-CM | POA: Diagnosis not present

## 2022-04-18 DIAGNOSIS — R293 Abnormal posture: Secondary | ICD-10-CM | POA: Diagnosis not present

## 2022-04-19 DIAGNOSIS — R293 Abnormal posture: Secondary | ICD-10-CM | POA: Diagnosis not present

## 2022-04-19 DIAGNOSIS — Z48811 Encounter for surgical aftercare following surgery on the nervous system: Secondary | ICD-10-CM | POA: Diagnosis not present

## 2022-04-19 DIAGNOSIS — R1311 Dysphagia, oral phase: Secondary | ICD-10-CM | POA: Diagnosis not present

## 2022-04-19 DIAGNOSIS — M5000 Cervical disc disorder with myelopathy, unspecified cervical region: Secondary | ICD-10-CM | POA: Diagnosis not present

## 2022-04-19 DIAGNOSIS — M6281 Muscle weakness (generalized): Secondary | ICD-10-CM | POA: Diagnosis not present

## 2022-04-19 DIAGNOSIS — R2689 Other abnormalities of gait and mobility: Secondary | ICD-10-CM | POA: Diagnosis not present

## 2022-04-20 DIAGNOSIS — R1311 Dysphagia, oral phase: Secondary | ICD-10-CM | POA: Diagnosis not present

## 2022-04-20 DIAGNOSIS — R2689 Other abnormalities of gait and mobility: Secondary | ICD-10-CM | POA: Diagnosis not present

## 2022-04-20 DIAGNOSIS — M5000 Cervical disc disorder with myelopathy, unspecified cervical region: Secondary | ICD-10-CM | POA: Diagnosis not present

## 2022-04-20 DIAGNOSIS — R293 Abnormal posture: Secondary | ICD-10-CM | POA: Diagnosis not present

## 2022-04-20 DIAGNOSIS — M6281 Muscle weakness (generalized): Secondary | ICD-10-CM | POA: Diagnosis not present

## 2022-04-20 DIAGNOSIS — Z48811 Encounter for surgical aftercare following surgery on the nervous system: Secondary | ICD-10-CM | POA: Diagnosis not present

## 2022-04-21 DIAGNOSIS — M6281 Muscle weakness (generalized): Secondary | ICD-10-CM | POA: Diagnosis not present

## 2022-04-21 DIAGNOSIS — M5000 Cervical disc disorder with myelopathy, unspecified cervical region: Secondary | ICD-10-CM | POA: Diagnosis not present

## 2022-04-21 DIAGNOSIS — Z48811 Encounter for surgical aftercare following surgery on the nervous system: Secondary | ICD-10-CM | POA: Diagnosis not present

## 2022-04-21 DIAGNOSIS — R1311 Dysphagia, oral phase: Secondary | ICD-10-CM | POA: Diagnosis not present

## 2022-04-21 DIAGNOSIS — R293 Abnormal posture: Secondary | ICD-10-CM | POA: Diagnosis not present

## 2022-04-21 DIAGNOSIS — R2689 Other abnormalities of gait and mobility: Secondary | ICD-10-CM | POA: Diagnosis not present

## 2022-04-23 DIAGNOSIS — R2689 Other abnormalities of gait and mobility: Secondary | ICD-10-CM | POA: Diagnosis not present

## 2022-04-23 DIAGNOSIS — M5000 Cervical disc disorder with myelopathy, unspecified cervical region: Secondary | ICD-10-CM | POA: Diagnosis not present

## 2022-04-23 DIAGNOSIS — R1311 Dysphagia, oral phase: Secondary | ICD-10-CM | POA: Diagnosis not present

## 2022-04-23 DIAGNOSIS — R293 Abnormal posture: Secondary | ICD-10-CM | POA: Diagnosis not present

## 2022-04-23 DIAGNOSIS — M6281 Muscle weakness (generalized): Secondary | ICD-10-CM | POA: Diagnosis not present

## 2022-04-23 DIAGNOSIS — Z48811 Encounter for surgical aftercare following surgery on the nervous system: Secondary | ICD-10-CM | POA: Diagnosis not present

## 2022-04-24 DIAGNOSIS — R1311 Dysphagia, oral phase: Secondary | ICD-10-CM | POA: Diagnosis not present

## 2022-04-24 DIAGNOSIS — Z48811 Encounter for surgical aftercare following surgery on the nervous system: Secondary | ICD-10-CM | POA: Diagnosis not present

## 2022-04-24 DIAGNOSIS — R2689 Other abnormalities of gait and mobility: Secondary | ICD-10-CM | POA: Diagnosis not present

## 2022-04-24 DIAGNOSIS — M5000 Cervical disc disorder with myelopathy, unspecified cervical region: Secondary | ICD-10-CM | POA: Diagnosis not present

## 2022-04-24 DIAGNOSIS — M6281 Muscle weakness (generalized): Secondary | ICD-10-CM | POA: Diagnosis not present

## 2022-04-24 DIAGNOSIS — R293 Abnormal posture: Secondary | ICD-10-CM | POA: Diagnosis not present

## 2022-04-25 DIAGNOSIS — Z48811 Encounter for surgical aftercare following surgery on the nervous system: Secondary | ICD-10-CM | POA: Diagnosis not present

## 2022-04-25 DIAGNOSIS — R1311 Dysphagia, oral phase: Secondary | ICD-10-CM | POA: Diagnosis not present

## 2022-04-25 DIAGNOSIS — R293 Abnormal posture: Secondary | ICD-10-CM | POA: Diagnosis not present

## 2022-04-25 DIAGNOSIS — R2689 Other abnormalities of gait and mobility: Secondary | ICD-10-CM | POA: Diagnosis not present

## 2022-04-25 DIAGNOSIS — M5000 Cervical disc disorder with myelopathy, unspecified cervical region: Secondary | ICD-10-CM | POA: Diagnosis not present

## 2022-04-25 DIAGNOSIS — M6281 Muscle weakness (generalized): Secondary | ICD-10-CM | POA: Diagnosis not present

## 2022-04-26 DIAGNOSIS — M6281 Muscle weakness (generalized): Secondary | ICD-10-CM | POA: Diagnosis not present

## 2022-04-26 DIAGNOSIS — R293 Abnormal posture: Secondary | ICD-10-CM | POA: Diagnosis not present

## 2022-04-26 DIAGNOSIS — R2689 Other abnormalities of gait and mobility: Secondary | ICD-10-CM | POA: Diagnosis not present

## 2022-04-26 DIAGNOSIS — Z48811 Encounter for surgical aftercare following surgery on the nervous system: Secondary | ICD-10-CM | POA: Diagnosis not present

## 2022-04-26 DIAGNOSIS — M5000 Cervical disc disorder with myelopathy, unspecified cervical region: Secondary | ICD-10-CM | POA: Diagnosis not present

## 2022-04-26 DIAGNOSIS — R1311 Dysphagia, oral phase: Secondary | ICD-10-CM | POA: Diagnosis not present

## 2022-04-27 DIAGNOSIS — R293 Abnormal posture: Secondary | ICD-10-CM | POA: Diagnosis not present

## 2022-04-27 DIAGNOSIS — R2689 Other abnormalities of gait and mobility: Secondary | ICD-10-CM | POA: Diagnosis not present

## 2022-04-27 DIAGNOSIS — R1311 Dysphagia, oral phase: Secondary | ICD-10-CM | POA: Diagnosis not present

## 2022-04-27 DIAGNOSIS — M6281 Muscle weakness (generalized): Secondary | ICD-10-CM | POA: Diagnosis not present

## 2022-04-27 DIAGNOSIS — M5000 Cervical disc disorder with myelopathy, unspecified cervical region: Secondary | ICD-10-CM | POA: Diagnosis not present

## 2022-04-27 DIAGNOSIS — Z48811 Encounter for surgical aftercare following surgery on the nervous system: Secondary | ICD-10-CM | POA: Diagnosis not present

## 2022-04-30 DIAGNOSIS — M5000 Cervical disc disorder with myelopathy, unspecified cervical region: Secondary | ICD-10-CM | POA: Diagnosis not present

## 2022-04-30 DIAGNOSIS — R1311 Dysphagia, oral phase: Secondary | ICD-10-CM | POA: Diagnosis not present

## 2022-04-30 DIAGNOSIS — R2689 Other abnormalities of gait and mobility: Secondary | ICD-10-CM | POA: Diagnosis not present

## 2022-04-30 DIAGNOSIS — R293 Abnormal posture: Secondary | ICD-10-CM | POA: Diagnosis not present

## 2022-04-30 DIAGNOSIS — M6281 Muscle weakness (generalized): Secondary | ICD-10-CM | POA: Diagnosis not present

## 2022-04-30 DIAGNOSIS — Z48811 Encounter for surgical aftercare following surgery on the nervous system: Secondary | ICD-10-CM | POA: Diagnosis not present

## 2022-05-01 DIAGNOSIS — M6281 Muscle weakness (generalized): Secondary | ICD-10-CM | POA: Diagnosis not present

## 2022-05-01 DIAGNOSIS — R293 Abnormal posture: Secondary | ICD-10-CM | POA: Diagnosis not present

## 2022-05-01 DIAGNOSIS — R1311 Dysphagia, oral phase: Secondary | ICD-10-CM | POA: Diagnosis not present

## 2022-05-01 DIAGNOSIS — Z48811 Encounter for surgical aftercare following surgery on the nervous system: Secondary | ICD-10-CM | POA: Diagnosis not present

## 2022-05-01 DIAGNOSIS — R2689 Other abnormalities of gait and mobility: Secondary | ICD-10-CM | POA: Diagnosis not present

## 2022-05-01 DIAGNOSIS — M5000 Cervical disc disorder with myelopathy, unspecified cervical region: Secondary | ICD-10-CM | POA: Diagnosis not present

## 2022-05-02 DIAGNOSIS — R293 Abnormal posture: Secondary | ICD-10-CM | POA: Diagnosis not present

## 2022-05-02 DIAGNOSIS — R1311 Dysphagia, oral phase: Secondary | ICD-10-CM | POA: Diagnosis not present

## 2022-05-02 DIAGNOSIS — R2689 Other abnormalities of gait and mobility: Secondary | ICD-10-CM | POA: Diagnosis not present

## 2022-05-02 DIAGNOSIS — M5000 Cervical disc disorder with myelopathy, unspecified cervical region: Secondary | ICD-10-CM | POA: Diagnosis not present

## 2022-05-02 DIAGNOSIS — M6281 Muscle weakness (generalized): Secondary | ICD-10-CM | POA: Diagnosis not present

## 2022-05-02 DIAGNOSIS — Z48811 Encounter for surgical aftercare following surgery on the nervous system: Secondary | ICD-10-CM | POA: Diagnosis not present

## 2022-05-03 DIAGNOSIS — M6281 Muscle weakness (generalized): Secondary | ICD-10-CM | POA: Diagnosis not present

## 2022-05-03 DIAGNOSIS — R1311 Dysphagia, oral phase: Secondary | ICD-10-CM | POA: Diagnosis not present

## 2022-05-03 DIAGNOSIS — M5000 Cervical disc disorder with myelopathy, unspecified cervical region: Secondary | ICD-10-CM | POA: Diagnosis not present

## 2022-05-03 DIAGNOSIS — R293 Abnormal posture: Secondary | ICD-10-CM | POA: Diagnosis not present

## 2022-05-03 DIAGNOSIS — Z48811 Encounter for surgical aftercare following surgery on the nervous system: Secondary | ICD-10-CM | POA: Diagnosis not present

## 2022-05-03 DIAGNOSIS — R2689 Other abnormalities of gait and mobility: Secondary | ICD-10-CM | POA: Diagnosis not present

## 2022-05-04 DIAGNOSIS — Z48811 Encounter for surgical aftercare following surgery on the nervous system: Secondary | ICD-10-CM | POA: Diagnosis not present

## 2022-05-04 DIAGNOSIS — R1311 Dysphagia, oral phase: Secondary | ICD-10-CM | POA: Diagnosis not present

## 2022-05-04 DIAGNOSIS — R293 Abnormal posture: Secondary | ICD-10-CM | POA: Diagnosis not present

## 2022-05-04 DIAGNOSIS — M6281 Muscle weakness (generalized): Secondary | ICD-10-CM | POA: Diagnosis not present

## 2022-05-04 DIAGNOSIS — M5000 Cervical disc disorder with myelopathy, unspecified cervical region: Secondary | ICD-10-CM | POA: Diagnosis not present

## 2022-05-04 DIAGNOSIS — R2689 Other abnormalities of gait and mobility: Secondary | ICD-10-CM | POA: Diagnosis not present

## 2022-05-08 DIAGNOSIS — M6281 Muscle weakness (generalized): Secondary | ICD-10-CM | POA: Diagnosis not present

## 2022-05-08 DIAGNOSIS — R293 Abnormal posture: Secondary | ICD-10-CM | POA: Diagnosis not present

## 2022-05-08 DIAGNOSIS — M5000 Cervical disc disorder with myelopathy, unspecified cervical region: Secondary | ICD-10-CM | POA: Diagnosis not present

## 2022-05-08 DIAGNOSIS — R2689 Other abnormalities of gait and mobility: Secondary | ICD-10-CM | POA: Diagnosis not present

## 2022-05-08 DIAGNOSIS — Z48811 Encounter for surgical aftercare following surgery on the nervous system: Secondary | ICD-10-CM | POA: Diagnosis not present

## 2022-05-08 DIAGNOSIS — R1311 Dysphagia, oral phase: Secondary | ICD-10-CM | POA: Diagnosis not present

## 2022-05-09 DIAGNOSIS — R1311 Dysphagia, oral phase: Secondary | ICD-10-CM | POA: Diagnosis not present

## 2022-05-09 DIAGNOSIS — R2689 Other abnormalities of gait and mobility: Secondary | ICD-10-CM | POA: Diagnosis not present

## 2022-05-09 DIAGNOSIS — M5000 Cervical disc disorder with myelopathy, unspecified cervical region: Secondary | ICD-10-CM | POA: Diagnosis not present

## 2022-05-09 DIAGNOSIS — Z48811 Encounter for surgical aftercare following surgery on the nervous system: Secondary | ICD-10-CM | POA: Diagnosis not present

## 2022-05-09 DIAGNOSIS — M6281 Muscle weakness (generalized): Secondary | ICD-10-CM | POA: Diagnosis not present

## 2022-05-09 DIAGNOSIS — R293 Abnormal posture: Secondary | ICD-10-CM | POA: Diagnosis not present

## 2022-05-10 DIAGNOSIS — Z48811 Encounter for surgical aftercare following surgery on the nervous system: Secondary | ICD-10-CM | POA: Diagnosis not present

## 2022-05-10 DIAGNOSIS — R293 Abnormal posture: Secondary | ICD-10-CM | POA: Diagnosis not present

## 2022-05-10 DIAGNOSIS — R2689 Other abnormalities of gait and mobility: Secondary | ICD-10-CM | POA: Diagnosis not present

## 2022-05-10 DIAGNOSIS — M5000 Cervical disc disorder with myelopathy, unspecified cervical region: Secondary | ICD-10-CM | POA: Diagnosis not present

## 2022-05-10 DIAGNOSIS — R1311 Dysphagia, oral phase: Secondary | ICD-10-CM | POA: Diagnosis not present

## 2022-05-10 DIAGNOSIS — M6281 Muscle weakness (generalized): Secondary | ICD-10-CM | POA: Diagnosis not present

## 2022-05-11 DIAGNOSIS — M6281 Muscle weakness (generalized): Secondary | ICD-10-CM | POA: Diagnosis not present

## 2022-05-11 DIAGNOSIS — M5000 Cervical disc disorder with myelopathy, unspecified cervical region: Secondary | ICD-10-CM | POA: Diagnosis not present

## 2022-05-11 DIAGNOSIS — Z48811 Encounter for surgical aftercare following surgery on the nervous system: Secondary | ICD-10-CM | POA: Diagnosis not present

## 2022-05-11 DIAGNOSIS — R293 Abnormal posture: Secondary | ICD-10-CM | POA: Diagnosis not present

## 2022-05-11 DIAGNOSIS — R2689 Other abnormalities of gait and mobility: Secondary | ICD-10-CM | POA: Diagnosis not present

## 2022-05-11 DIAGNOSIS — R1311 Dysphagia, oral phase: Secondary | ICD-10-CM | POA: Diagnosis not present

## 2022-05-14 DIAGNOSIS — M6281 Muscle weakness (generalized): Secondary | ICD-10-CM | POA: Diagnosis not present

## 2022-05-14 DIAGNOSIS — R2689 Other abnormalities of gait and mobility: Secondary | ICD-10-CM | POA: Diagnosis not present

## 2022-05-14 DIAGNOSIS — R293 Abnormal posture: Secondary | ICD-10-CM | POA: Diagnosis not present

## 2022-05-14 DIAGNOSIS — M5000 Cervical disc disorder with myelopathy, unspecified cervical region: Secondary | ICD-10-CM | POA: Diagnosis not present

## 2022-05-14 DIAGNOSIS — R1311 Dysphagia, oral phase: Secondary | ICD-10-CM | POA: Diagnosis not present

## 2022-05-14 DIAGNOSIS — Z48811 Encounter for surgical aftercare following surgery on the nervous system: Secondary | ICD-10-CM | POA: Diagnosis not present

## 2022-05-15 DIAGNOSIS — M6281 Muscle weakness (generalized): Secondary | ICD-10-CM | POA: Diagnosis not present

## 2022-05-15 DIAGNOSIS — M5000 Cervical disc disorder with myelopathy, unspecified cervical region: Secondary | ICD-10-CM | POA: Diagnosis not present

## 2022-05-15 DIAGNOSIS — R1311 Dysphagia, oral phase: Secondary | ICD-10-CM | POA: Diagnosis not present

## 2022-05-15 DIAGNOSIS — R2689 Other abnormalities of gait and mobility: Secondary | ICD-10-CM | POA: Diagnosis not present

## 2022-05-15 DIAGNOSIS — Z48811 Encounter for surgical aftercare following surgery on the nervous system: Secondary | ICD-10-CM | POA: Diagnosis not present

## 2022-05-15 DIAGNOSIS — R293 Abnormal posture: Secondary | ICD-10-CM | POA: Diagnosis not present

## 2022-05-16 DIAGNOSIS — M5000 Cervical disc disorder with myelopathy, unspecified cervical region: Secondary | ICD-10-CM | POA: Diagnosis not present

## 2022-05-16 DIAGNOSIS — Z48811 Encounter for surgical aftercare following surgery on the nervous system: Secondary | ICD-10-CM | POA: Diagnosis not present

## 2022-05-16 DIAGNOSIS — R293 Abnormal posture: Secondary | ICD-10-CM | POA: Diagnosis not present

## 2022-05-16 DIAGNOSIS — R1311 Dysphagia, oral phase: Secondary | ICD-10-CM | POA: Diagnosis not present

## 2022-05-16 DIAGNOSIS — R2689 Other abnormalities of gait and mobility: Secondary | ICD-10-CM | POA: Diagnosis not present

## 2022-05-16 DIAGNOSIS — M6281 Muscle weakness (generalized): Secondary | ICD-10-CM | POA: Diagnosis not present

## 2022-05-17 DIAGNOSIS — R293 Abnormal posture: Secondary | ICD-10-CM | POA: Diagnosis not present

## 2022-05-17 DIAGNOSIS — Z48811 Encounter for surgical aftercare following surgery on the nervous system: Secondary | ICD-10-CM | POA: Diagnosis not present

## 2022-05-17 DIAGNOSIS — M6281 Muscle weakness (generalized): Secondary | ICD-10-CM | POA: Diagnosis not present

## 2022-05-17 DIAGNOSIS — R1311 Dysphagia, oral phase: Secondary | ICD-10-CM | POA: Diagnosis not present

## 2022-05-17 DIAGNOSIS — M5000 Cervical disc disorder with myelopathy, unspecified cervical region: Secondary | ICD-10-CM | POA: Diagnosis not present

## 2022-05-17 DIAGNOSIS — R2689 Other abnormalities of gait and mobility: Secondary | ICD-10-CM | POA: Diagnosis not present

## 2022-05-18 DIAGNOSIS — R1311 Dysphagia, oral phase: Secondary | ICD-10-CM | POA: Diagnosis not present

## 2022-05-18 DIAGNOSIS — R2689 Other abnormalities of gait and mobility: Secondary | ICD-10-CM | POA: Diagnosis not present

## 2022-05-18 DIAGNOSIS — M5000 Cervical disc disorder with myelopathy, unspecified cervical region: Secondary | ICD-10-CM | POA: Diagnosis not present

## 2022-05-18 DIAGNOSIS — M6281 Muscle weakness (generalized): Secondary | ICD-10-CM | POA: Diagnosis not present

## 2022-05-18 DIAGNOSIS — Z48811 Encounter for surgical aftercare following surgery on the nervous system: Secondary | ICD-10-CM | POA: Diagnosis not present

## 2022-05-18 DIAGNOSIS — R293 Abnormal posture: Secondary | ICD-10-CM | POA: Diagnosis not present

## 2022-05-19 DIAGNOSIS — I10 Essential (primary) hypertension: Secondary | ICD-10-CM | POA: Diagnosis not present

## 2022-05-19 DIAGNOSIS — G959 Disease of spinal cord, unspecified: Secondary | ICD-10-CM | POA: Diagnosis not present

## 2022-05-22 DIAGNOSIS — R1311 Dysphagia, oral phase: Secondary | ICD-10-CM | POA: Diagnosis not present

## 2022-05-22 DIAGNOSIS — R293 Abnormal posture: Secondary | ICD-10-CM | POA: Diagnosis not present

## 2022-05-22 DIAGNOSIS — R2689 Other abnormalities of gait and mobility: Secondary | ICD-10-CM | POA: Diagnosis not present

## 2022-05-22 DIAGNOSIS — Z48811 Encounter for surgical aftercare following surgery on the nervous system: Secondary | ICD-10-CM | POA: Diagnosis not present

## 2022-05-22 DIAGNOSIS — M5000 Cervical disc disorder with myelopathy, unspecified cervical region: Secondary | ICD-10-CM | POA: Diagnosis not present

## 2022-05-22 DIAGNOSIS — M6281 Muscle weakness (generalized): Secondary | ICD-10-CM | POA: Diagnosis not present

## 2022-05-23 DIAGNOSIS — Z48811 Encounter for surgical aftercare following surgery on the nervous system: Secondary | ICD-10-CM | POA: Diagnosis not present

## 2022-05-23 DIAGNOSIS — M4312 Spondylolisthesis, cervical region: Secondary | ICD-10-CM | POA: Diagnosis not present

## 2022-05-23 DIAGNOSIS — M5031 Other cervical disc degeneration,  high cervical region: Secondary | ICD-10-CM | POA: Diagnosis not present

## 2022-05-23 DIAGNOSIS — M5000 Cervical disc disorder with myelopathy, unspecified cervical region: Secondary | ICD-10-CM | POA: Diagnosis not present

## 2022-05-23 DIAGNOSIS — M6281 Muscle weakness (generalized): Secondary | ICD-10-CM | POA: Diagnosis not present

## 2022-05-23 DIAGNOSIS — T84226A Displacement of internal fixation device of vertebrae, initial encounter: Secondary | ICD-10-CM | POA: Diagnosis not present

## 2022-05-23 DIAGNOSIS — Z981 Arthrodesis status: Secondary | ICD-10-CM | POA: Diagnosis not present

## 2022-05-23 DIAGNOSIS — M47812 Spondylosis without myelopathy or radiculopathy, cervical region: Secondary | ICD-10-CM | POA: Diagnosis not present

## 2022-05-23 DIAGNOSIS — R293 Abnormal posture: Secondary | ICD-10-CM | POA: Diagnosis not present

## 2022-05-23 DIAGNOSIS — R2689 Other abnormalities of gait and mobility: Secondary | ICD-10-CM | POA: Diagnosis not present

## 2022-05-23 DIAGNOSIS — R1311 Dysphagia, oral phase: Secondary | ICD-10-CM | POA: Diagnosis not present

## 2022-05-24 DIAGNOSIS — Z981 Arthrodesis status: Secondary | ICD-10-CM | POA: Diagnosis not present

## 2022-05-24 DIAGNOSIS — Z48811 Encounter for surgical aftercare following surgery on the nervous system: Secondary | ICD-10-CM | POA: Diagnosis not present

## 2022-05-24 DIAGNOSIS — R2689 Other abnormalities of gait and mobility: Secondary | ICD-10-CM | POA: Diagnosis not present

## 2022-05-24 DIAGNOSIS — R293 Abnormal posture: Secondary | ICD-10-CM | POA: Diagnosis not present

## 2022-05-24 DIAGNOSIS — R1311 Dysphagia, oral phase: Secondary | ICD-10-CM | POA: Diagnosis not present

## 2022-05-24 DIAGNOSIS — M6281 Muscle weakness (generalized): Secondary | ICD-10-CM | POA: Diagnosis not present

## 2022-05-25 DIAGNOSIS — Z48811 Encounter for surgical aftercare following surgery on the nervous system: Secondary | ICD-10-CM | POA: Diagnosis not present

## 2022-05-25 DIAGNOSIS — R293 Abnormal posture: Secondary | ICD-10-CM | POA: Diagnosis not present

## 2022-05-25 DIAGNOSIS — M6281 Muscle weakness (generalized): Secondary | ICD-10-CM | POA: Diagnosis not present

## 2022-05-25 DIAGNOSIS — R2689 Other abnormalities of gait and mobility: Secondary | ICD-10-CM | POA: Diagnosis not present

## 2022-05-25 DIAGNOSIS — R1311 Dysphagia, oral phase: Secondary | ICD-10-CM | POA: Diagnosis not present

## 2022-05-28 DIAGNOSIS — R293 Abnormal posture: Secondary | ICD-10-CM | POA: Diagnosis not present

## 2022-05-28 DIAGNOSIS — Z48811 Encounter for surgical aftercare following surgery on the nervous system: Secondary | ICD-10-CM | POA: Diagnosis not present

## 2022-05-28 DIAGNOSIS — M6281 Muscle weakness (generalized): Secondary | ICD-10-CM | POA: Diagnosis not present

## 2022-05-28 DIAGNOSIS — R2689 Other abnormalities of gait and mobility: Secondary | ICD-10-CM | POA: Diagnosis not present

## 2022-05-28 DIAGNOSIS — R1311 Dysphagia, oral phase: Secondary | ICD-10-CM | POA: Diagnosis not present

## 2022-05-29 DIAGNOSIS — M6281 Muscle weakness (generalized): Secondary | ICD-10-CM | POA: Diagnosis not present

## 2022-05-29 DIAGNOSIS — R293 Abnormal posture: Secondary | ICD-10-CM | POA: Diagnosis not present

## 2022-05-29 DIAGNOSIS — R2689 Other abnormalities of gait and mobility: Secondary | ICD-10-CM | POA: Diagnosis not present

## 2022-05-29 DIAGNOSIS — R1311 Dysphagia, oral phase: Secondary | ICD-10-CM | POA: Diagnosis not present

## 2022-05-29 DIAGNOSIS — Z48811 Encounter for surgical aftercare following surgery on the nervous system: Secondary | ICD-10-CM | POA: Diagnosis not present

## 2022-05-30 DIAGNOSIS — R293 Abnormal posture: Secondary | ICD-10-CM | POA: Diagnosis not present

## 2022-05-30 DIAGNOSIS — Z48811 Encounter for surgical aftercare following surgery on the nervous system: Secondary | ICD-10-CM | POA: Diagnosis not present

## 2022-05-30 DIAGNOSIS — R1311 Dysphagia, oral phase: Secondary | ICD-10-CM | POA: Diagnosis not present

## 2022-05-30 DIAGNOSIS — R2689 Other abnormalities of gait and mobility: Secondary | ICD-10-CM | POA: Diagnosis not present

## 2022-05-30 DIAGNOSIS — M6281 Muscle weakness (generalized): Secondary | ICD-10-CM | POA: Diagnosis not present

## 2022-05-31 DIAGNOSIS — R293 Abnormal posture: Secondary | ICD-10-CM | POA: Diagnosis not present

## 2022-05-31 DIAGNOSIS — Z48811 Encounter for surgical aftercare following surgery on the nervous system: Secondary | ICD-10-CM | POA: Diagnosis not present

## 2022-05-31 DIAGNOSIS — M6281 Muscle weakness (generalized): Secondary | ICD-10-CM | POA: Diagnosis not present

## 2022-05-31 DIAGNOSIS — R1311 Dysphagia, oral phase: Secondary | ICD-10-CM | POA: Diagnosis not present

## 2022-05-31 DIAGNOSIS — R2689 Other abnormalities of gait and mobility: Secondary | ICD-10-CM | POA: Diagnosis not present

## 2022-06-04 DIAGNOSIS — R293 Abnormal posture: Secondary | ICD-10-CM | POA: Diagnosis not present

## 2022-06-04 DIAGNOSIS — M6281 Muscle weakness (generalized): Secondary | ICD-10-CM | POA: Diagnosis not present

## 2022-06-04 DIAGNOSIS — Z48811 Encounter for surgical aftercare following surgery on the nervous system: Secondary | ICD-10-CM | POA: Diagnosis not present

## 2022-06-04 DIAGNOSIS — R1311 Dysphagia, oral phase: Secondary | ICD-10-CM | POA: Diagnosis not present

## 2022-06-04 DIAGNOSIS — R2689 Other abnormalities of gait and mobility: Secondary | ICD-10-CM | POA: Diagnosis not present

## 2022-06-05 DIAGNOSIS — R293 Abnormal posture: Secondary | ICD-10-CM | POA: Diagnosis not present

## 2022-06-05 DIAGNOSIS — R1311 Dysphagia, oral phase: Secondary | ICD-10-CM | POA: Diagnosis not present

## 2022-06-05 DIAGNOSIS — Z48811 Encounter for surgical aftercare following surgery on the nervous system: Secondary | ICD-10-CM | POA: Diagnosis not present

## 2022-06-05 DIAGNOSIS — R2689 Other abnormalities of gait and mobility: Secondary | ICD-10-CM | POA: Diagnosis not present

## 2022-06-05 DIAGNOSIS — M6281 Muscle weakness (generalized): Secondary | ICD-10-CM | POA: Diagnosis not present

## 2022-06-06 DIAGNOSIS — R2689 Other abnormalities of gait and mobility: Secondary | ICD-10-CM | POA: Diagnosis not present

## 2022-06-06 DIAGNOSIS — R1311 Dysphagia, oral phase: Secondary | ICD-10-CM | POA: Diagnosis not present

## 2022-06-06 DIAGNOSIS — R293 Abnormal posture: Secondary | ICD-10-CM | POA: Diagnosis not present

## 2022-06-06 DIAGNOSIS — M6281 Muscle weakness (generalized): Secondary | ICD-10-CM | POA: Diagnosis not present

## 2022-06-06 DIAGNOSIS — Z48811 Encounter for surgical aftercare following surgery on the nervous system: Secondary | ICD-10-CM | POA: Diagnosis not present

## 2022-06-07 DIAGNOSIS — R293 Abnormal posture: Secondary | ICD-10-CM | POA: Diagnosis not present

## 2022-06-07 DIAGNOSIS — M6281 Muscle weakness (generalized): Secondary | ICD-10-CM | POA: Diagnosis not present

## 2022-06-07 DIAGNOSIS — Z48811 Encounter for surgical aftercare following surgery on the nervous system: Secondary | ICD-10-CM | POA: Diagnosis not present

## 2022-06-07 DIAGNOSIS — R2689 Other abnormalities of gait and mobility: Secondary | ICD-10-CM | POA: Diagnosis not present

## 2022-06-07 DIAGNOSIS — R1311 Dysphagia, oral phase: Secondary | ICD-10-CM | POA: Diagnosis not present

## 2022-06-08 DIAGNOSIS — R1311 Dysphagia, oral phase: Secondary | ICD-10-CM | POA: Diagnosis not present

## 2022-06-08 DIAGNOSIS — M6281 Muscle weakness (generalized): Secondary | ICD-10-CM | POA: Diagnosis not present

## 2022-06-08 DIAGNOSIS — R293 Abnormal posture: Secondary | ICD-10-CM | POA: Diagnosis not present

## 2022-06-08 DIAGNOSIS — R2689 Other abnormalities of gait and mobility: Secondary | ICD-10-CM | POA: Diagnosis not present

## 2022-06-08 DIAGNOSIS — Z48811 Encounter for surgical aftercare following surgery on the nervous system: Secondary | ICD-10-CM | POA: Diagnosis not present

## 2022-06-09 DIAGNOSIS — R2689 Other abnormalities of gait and mobility: Secondary | ICD-10-CM | POA: Diagnosis not present

## 2022-06-09 DIAGNOSIS — M6281 Muscle weakness (generalized): Secondary | ICD-10-CM | POA: Diagnosis not present

## 2022-06-09 DIAGNOSIS — Z48811 Encounter for surgical aftercare following surgery on the nervous system: Secondary | ICD-10-CM | POA: Diagnosis not present

## 2022-06-09 DIAGNOSIS — R1311 Dysphagia, oral phase: Secondary | ICD-10-CM | POA: Diagnosis not present

## 2022-06-09 DIAGNOSIS — R293 Abnormal posture: Secondary | ICD-10-CM | POA: Diagnosis not present

## 2022-06-11 DIAGNOSIS — R293 Abnormal posture: Secondary | ICD-10-CM | POA: Diagnosis not present

## 2022-06-11 DIAGNOSIS — M6281 Muscle weakness (generalized): Secondary | ICD-10-CM | POA: Diagnosis not present

## 2022-06-11 DIAGNOSIS — R1311 Dysphagia, oral phase: Secondary | ICD-10-CM | POA: Diagnosis not present

## 2022-06-11 DIAGNOSIS — R2689 Other abnormalities of gait and mobility: Secondary | ICD-10-CM | POA: Diagnosis not present

## 2022-06-11 DIAGNOSIS — Z48811 Encounter for surgical aftercare following surgery on the nervous system: Secondary | ICD-10-CM | POA: Diagnosis not present

## 2022-06-12 DIAGNOSIS — R293 Abnormal posture: Secondary | ICD-10-CM | POA: Diagnosis not present

## 2022-06-12 DIAGNOSIS — R2689 Other abnormalities of gait and mobility: Secondary | ICD-10-CM | POA: Diagnosis not present

## 2022-06-12 DIAGNOSIS — R1311 Dysphagia, oral phase: Secondary | ICD-10-CM | POA: Diagnosis not present

## 2022-06-12 DIAGNOSIS — M6281 Muscle weakness (generalized): Secondary | ICD-10-CM | POA: Diagnosis not present

## 2022-06-12 DIAGNOSIS — Z48811 Encounter for surgical aftercare following surgery on the nervous system: Secondary | ICD-10-CM | POA: Diagnosis not present

## 2022-06-13 DIAGNOSIS — R1311 Dysphagia, oral phase: Secondary | ICD-10-CM | POA: Diagnosis not present

## 2022-06-13 DIAGNOSIS — M6281 Muscle weakness (generalized): Secondary | ICD-10-CM | POA: Diagnosis not present

## 2022-06-13 DIAGNOSIS — Z48811 Encounter for surgical aftercare following surgery on the nervous system: Secondary | ICD-10-CM | POA: Diagnosis not present

## 2022-06-13 DIAGNOSIS — R293 Abnormal posture: Secondary | ICD-10-CM | POA: Diagnosis not present

## 2022-06-13 DIAGNOSIS — R2689 Other abnormalities of gait and mobility: Secondary | ICD-10-CM | POA: Diagnosis not present

## 2022-06-14 DIAGNOSIS — R293 Abnormal posture: Secondary | ICD-10-CM | POA: Diagnosis not present

## 2022-06-14 DIAGNOSIS — Z48811 Encounter for surgical aftercare following surgery on the nervous system: Secondary | ICD-10-CM | POA: Diagnosis not present

## 2022-06-14 DIAGNOSIS — R1311 Dysphagia, oral phase: Secondary | ICD-10-CM | POA: Diagnosis not present

## 2022-06-14 DIAGNOSIS — M6281 Muscle weakness (generalized): Secondary | ICD-10-CM | POA: Diagnosis not present

## 2022-06-14 DIAGNOSIS — R2689 Other abnormalities of gait and mobility: Secondary | ICD-10-CM | POA: Diagnosis not present

## 2022-06-15 DIAGNOSIS — R293 Abnormal posture: Secondary | ICD-10-CM | POA: Diagnosis not present

## 2022-06-15 DIAGNOSIS — M6281 Muscle weakness (generalized): Secondary | ICD-10-CM | POA: Diagnosis not present

## 2022-06-15 DIAGNOSIS — R1311 Dysphagia, oral phase: Secondary | ICD-10-CM | POA: Diagnosis not present

## 2022-06-15 DIAGNOSIS — Z48811 Encounter for surgical aftercare following surgery on the nervous system: Secondary | ICD-10-CM | POA: Diagnosis not present

## 2022-06-15 DIAGNOSIS — R2689 Other abnormalities of gait and mobility: Secondary | ICD-10-CM | POA: Diagnosis not present

## 2022-06-16 DIAGNOSIS — Z48811 Encounter for surgical aftercare following surgery on the nervous system: Secondary | ICD-10-CM | POA: Diagnosis not present

## 2022-06-16 DIAGNOSIS — R293 Abnormal posture: Secondary | ICD-10-CM | POA: Diagnosis not present

## 2022-06-16 DIAGNOSIS — M6281 Muscle weakness (generalized): Secondary | ICD-10-CM | POA: Diagnosis not present

## 2022-06-16 DIAGNOSIS — R1311 Dysphagia, oral phase: Secondary | ICD-10-CM | POA: Diagnosis not present

## 2022-06-16 DIAGNOSIS — R2689 Other abnormalities of gait and mobility: Secondary | ICD-10-CM | POA: Diagnosis not present

## 2022-06-18 DIAGNOSIS — R293 Abnormal posture: Secondary | ICD-10-CM | POA: Diagnosis not present

## 2022-06-18 DIAGNOSIS — R2689 Other abnormalities of gait and mobility: Secondary | ICD-10-CM | POA: Diagnosis not present

## 2022-06-18 DIAGNOSIS — M6281 Muscle weakness (generalized): Secondary | ICD-10-CM | POA: Diagnosis not present

## 2022-06-18 DIAGNOSIS — R1311 Dysphagia, oral phase: Secondary | ICD-10-CM | POA: Diagnosis not present

## 2022-06-18 DIAGNOSIS — Z48811 Encounter for surgical aftercare following surgery on the nervous system: Secondary | ICD-10-CM | POA: Diagnosis not present

## 2022-06-19 DIAGNOSIS — R2689 Other abnormalities of gait and mobility: Secondary | ICD-10-CM | POA: Diagnosis not present

## 2022-06-19 DIAGNOSIS — Z48811 Encounter for surgical aftercare following surgery on the nervous system: Secondary | ICD-10-CM | POA: Diagnosis not present

## 2022-06-19 DIAGNOSIS — R293 Abnormal posture: Secondary | ICD-10-CM | POA: Diagnosis not present

## 2022-06-19 DIAGNOSIS — R1311 Dysphagia, oral phase: Secondary | ICD-10-CM | POA: Diagnosis not present

## 2022-06-19 DIAGNOSIS — M6281 Muscle weakness (generalized): Secondary | ICD-10-CM | POA: Diagnosis not present

## 2022-06-20 DIAGNOSIS — R2689 Other abnormalities of gait and mobility: Secondary | ICD-10-CM | POA: Diagnosis not present

## 2022-06-20 DIAGNOSIS — M6281 Muscle weakness (generalized): Secondary | ICD-10-CM | POA: Diagnosis not present

## 2022-06-20 DIAGNOSIS — R1311 Dysphagia, oral phase: Secondary | ICD-10-CM | POA: Diagnosis not present

## 2022-06-20 DIAGNOSIS — Z48811 Encounter for surgical aftercare following surgery on the nervous system: Secondary | ICD-10-CM | POA: Diagnosis not present

## 2022-06-20 DIAGNOSIS — R293 Abnormal posture: Secondary | ICD-10-CM | POA: Diagnosis not present

## 2022-06-21 DIAGNOSIS — Z48811 Encounter for surgical aftercare following surgery on the nervous system: Secondary | ICD-10-CM | POA: Diagnosis not present

## 2022-06-21 DIAGNOSIS — R1311 Dysphagia, oral phase: Secondary | ICD-10-CM | POA: Diagnosis not present

## 2022-06-21 DIAGNOSIS — R2689 Other abnormalities of gait and mobility: Secondary | ICD-10-CM | POA: Diagnosis not present

## 2022-06-21 DIAGNOSIS — M6281 Muscle weakness (generalized): Secondary | ICD-10-CM | POA: Diagnosis not present

## 2022-06-21 DIAGNOSIS — R293 Abnormal posture: Secondary | ICD-10-CM | POA: Diagnosis not present

## 2022-06-22 DIAGNOSIS — R1311 Dysphagia, oral phase: Secondary | ICD-10-CM | POA: Diagnosis not present

## 2022-06-22 DIAGNOSIS — M6281 Muscle weakness (generalized): Secondary | ICD-10-CM | POA: Diagnosis not present

## 2022-06-22 DIAGNOSIS — Z48811 Encounter for surgical aftercare following surgery on the nervous system: Secondary | ICD-10-CM | POA: Diagnosis not present

## 2022-06-22 DIAGNOSIS — R293 Abnormal posture: Secondary | ICD-10-CM | POA: Diagnosis not present

## 2022-06-22 DIAGNOSIS — R2689 Other abnormalities of gait and mobility: Secondary | ICD-10-CM | POA: Diagnosis not present

## 2022-06-25 DIAGNOSIS — Z48811 Encounter for surgical aftercare following surgery on the nervous system: Secondary | ICD-10-CM | POA: Diagnosis not present

## 2022-06-25 DIAGNOSIS — R1311 Dysphagia, oral phase: Secondary | ICD-10-CM | POA: Diagnosis not present

## 2022-06-25 DIAGNOSIS — R293 Abnormal posture: Secondary | ICD-10-CM | POA: Diagnosis not present

## 2022-06-25 DIAGNOSIS — R2689 Other abnormalities of gait and mobility: Secondary | ICD-10-CM | POA: Diagnosis not present

## 2022-06-25 DIAGNOSIS — M6281 Muscle weakness (generalized): Secondary | ICD-10-CM | POA: Diagnosis not present

## 2022-06-26 DIAGNOSIS — M6281 Muscle weakness (generalized): Secondary | ICD-10-CM | POA: Diagnosis not present

## 2022-06-26 DIAGNOSIS — Z48811 Encounter for surgical aftercare following surgery on the nervous system: Secondary | ICD-10-CM | POA: Diagnosis not present

## 2022-06-26 DIAGNOSIS — R2689 Other abnormalities of gait and mobility: Secondary | ICD-10-CM | POA: Diagnosis not present

## 2022-06-26 DIAGNOSIS — R293 Abnormal posture: Secondary | ICD-10-CM | POA: Diagnosis not present

## 2022-06-26 DIAGNOSIS — R1311 Dysphagia, oral phase: Secondary | ICD-10-CM | POA: Diagnosis not present

## 2022-06-27 DIAGNOSIS — R293 Abnormal posture: Secondary | ICD-10-CM | POA: Diagnosis not present

## 2022-06-27 DIAGNOSIS — Z48811 Encounter for surgical aftercare following surgery on the nervous system: Secondary | ICD-10-CM | POA: Diagnosis not present

## 2022-06-27 DIAGNOSIS — R2689 Other abnormalities of gait and mobility: Secondary | ICD-10-CM | POA: Diagnosis not present

## 2022-06-27 DIAGNOSIS — R1311 Dysphagia, oral phase: Secondary | ICD-10-CM | POA: Diagnosis not present

## 2022-06-27 DIAGNOSIS — M6281 Muscle weakness (generalized): Secondary | ICD-10-CM | POA: Diagnosis not present

## 2022-06-28 DIAGNOSIS — R293 Abnormal posture: Secondary | ICD-10-CM | POA: Diagnosis not present

## 2022-06-28 DIAGNOSIS — Z48811 Encounter for surgical aftercare following surgery on the nervous system: Secondary | ICD-10-CM | POA: Diagnosis not present

## 2022-06-28 DIAGNOSIS — R1311 Dysphagia, oral phase: Secondary | ICD-10-CM | POA: Diagnosis not present

## 2022-06-28 DIAGNOSIS — R2689 Other abnormalities of gait and mobility: Secondary | ICD-10-CM | POA: Diagnosis not present

## 2022-06-28 DIAGNOSIS — M6281 Muscle weakness (generalized): Secondary | ICD-10-CM | POA: Diagnosis not present

## 2022-06-29 DIAGNOSIS — R293 Abnormal posture: Secondary | ICD-10-CM | POA: Diagnosis not present

## 2022-06-29 DIAGNOSIS — M6281 Muscle weakness (generalized): Secondary | ICD-10-CM | POA: Diagnosis not present

## 2022-06-29 DIAGNOSIS — R2689 Other abnormalities of gait and mobility: Secondary | ICD-10-CM | POA: Diagnosis not present

## 2022-06-29 DIAGNOSIS — Z48811 Encounter for surgical aftercare following surgery on the nervous system: Secondary | ICD-10-CM | POA: Diagnosis not present

## 2022-06-29 DIAGNOSIS — R1311 Dysphagia, oral phase: Secondary | ICD-10-CM | POA: Diagnosis not present

## 2022-07-02 DIAGNOSIS — R1311 Dysphagia, oral phase: Secondary | ICD-10-CM | POA: Diagnosis not present

## 2022-07-02 DIAGNOSIS — R293 Abnormal posture: Secondary | ICD-10-CM | POA: Diagnosis not present

## 2022-07-02 DIAGNOSIS — Z48811 Encounter for surgical aftercare following surgery on the nervous system: Secondary | ICD-10-CM | POA: Diagnosis not present

## 2022-07-02 DIAGNOSIS — R2689 Other abnormalities of gait and mobility: Secondary | ICD-10-CM | POA: Diagnosis not present

## 2022-07-02 DIAGNOSIS — M6281 Muscle weakness (generalized): Secondary | ICD-10-CM | POA: Diagnosis not present

## 2022-07-03 DIAGNOSIS — R293 Abnormal posture: Secondary | ICD-10-CM | POA: Diagnosis not present

## 2022-07-03 DIAGNOSIS — M6281 Muscle weakness (generalized): Secondary | ICD-10-CM | POA: Diagnosis not present

## 2022-07-03 DIAGNOSIS — R2689 Other abnormalities of gait and mobility: Secondary | ICD-10-CM | POA: Diagnosis not present

## 2022-07-03 DIAGNOSIS — R1311 Dysphagia, oral phase: Secondary | ICD-10-CM | POA: Diagnosis not present

## 2022-07-03 DIAGNOSIS — Z48811 Encounter for surgical aftercare following surgery on the nervous system: Secondary | ICD-10-CM | POA: Diagnosis not present

## 2022-07-04 DIAGNOSIS — M6281 Muscle weakness (generalized): Secondary | ICD-10-CM | POA: Diagnosis not present

## 2022-07-04 DIAGNOSIS — R2689 Other abnormalities of gait and mobility: Secondary | ICD-10-CM | POA: Diagnosis not present

## 2022-07-04 DIAGNOSIS — Z48811 Encounter for surgical aftercare following surgery on the nervous system: Secondary | ICD-10-CM | POA: Diagnosis not present

## 2022-07-04 DIAGNOSIS — R293 Abnormal posture: Secondary | ICD-10-CM | POA: Diagnosis not present

## 2022-07-04 DIAGNOSIS — R1311 Dysphagia, oral phase: Secondary | ICD-10-CM | POA: Diagnosis not present

## 2022-07-05 DIAGNOSIS — M6281 Muscle weakness (generalized): Secondary | ICD-10-CM | POA: Diagnosis not present

## 2022-07-05 DIAGNOSIS — Z48811 Encounter for surgical aftercare following surgery on the nervous system: Secondary | ICD-10-CM | POA: Diagnosis not present

## 2022-07-05 DIAGNOSIS — R293 Abnormal posture: Secondary | ICD-10-CM | POA: Diagnosis not present

## 2022-07-05 DIAGNOSIS — R1311 Dysphagia, oral phase: Secondary | ICD-10-CM | POA: Diagnosis not present

## 2022-07-05 DIAGNOSIS — R2689 Other abnormalities of gait and mobility: Secondary | ICD-10-CM | POA: Diagnosis not present

## 2022-07-06 DIAGNOSIS — G959 Disease of spinal cord, unspecified: Secondary | ICD-10-CM | POA: Diagnosis not present

## 2022-07-06 DIAGNOSIS — R2689 Other abnormalities of gait and mobility: Secondary | ICD-10-CM | POA: Diagnosis not present

## 2022-07-06 DIAGNOSIS — M6281 Muscle weakness (generalized): Secondary | ICD-10-CM | POA: Diagnosis not present

## 2022-07-06 DIAGNOSIS — R1311 Dysphagia, oral phase: Secondary | ICD-10-CM | POA: Diagnosis not present

## 2022-07-06 DIAGNOSIS — R293 Abnormal posture: Secondary | ICD-10-CM | POA: Diagnosis not present

## 2022-07-06 DIAGNOSIS — Z48811 Encounter for surgical aftercare following surgery on the nervous system: Secondary | ICD-10-CM | POA: Diagnosis not present

## 2022-07-06 DIAGNOSIS — I10 Essential (primary) hypertension: Secondary | ICD-10-CM | POA: Diagnosis not present

## 2022-07-07 DIAGNOSIS — R1311 Dysphagia, oral phase: Secondary | ICD-10-CM | POA: Diagnosis not present

## 2022-07-07 DIAGNOSIS — R2689 Other abnormalities of gait and mobility: Secondary | ICD-10-CM | POA: Diagnosis not present

## 2022-07-07 DIAGNOSIS — R293 Abnormal posture: Secondary | ICD-10-CM | POA: Diagnosis not present

## 2022-07-07 DIAGNOSIS — Z48811 Encounter for surgical aftercare following surgery on the nervous system: Secondary | ICD-10-CM | POA: Diagnosis not present

## 2022-07-07 DIAGNOSIS — M6281 Muscle weakness (generalized): Secondary | ICD-10-CM | POA: Diagnosis not present

## 2022-07-08 DIAGNOSIS — M6281 Muscle weakness (generalized): Secondary | ICD-10-CM | POA: Diagnosis not present

## 2022-07-08 DIAGNOSIS — R293 Abnormal posture: Secondary | ICD-10-CM | POA: Diagnosis not present

## 2022-07-08 DIAGNOSIS — R1311 Dysphagia, oral phase: Secondary | ICD-10-CM | POA: Diagnosis not present

## 2022-07-08 DIAGNOSIS — R2689 Other abnormalities of gait and mobility: Secondary | ICD-10-CM | POA: Diagnosis not present

## 2022-07-08 DIAGNOSIS — Z48811 Encounter for surgical aftercare following surgery on the nervous system: Secondary | ICD-10-CM | POA: Diagnosis not present

## 2022-07-09 DIAGNOSIS — R2689 Other abnormalities of gait and mobility: Secondary | ICD-10-CM | POA: Diagnosis not present

## 2022-07-09 DIAGNOSIS — Z48811 Encounter for surgical aftercare following surgery on the nervous system: Secondary | ICD-10-CM | POA: Diagnosis not present

## 2022-07-09 DIAGNOSIS — M6281 Muscle weakness (generalized): Secondary | ICD-10-CM | POA: Diagnosis not present

## 2022-07-09 DIAGNOSIS — R293 Abnormal posture: Secondary | ICD-10-CM | POA: Diagnosis not present

## 2022-07-09 DIAGNOSIS — R1311 Dysphagia, oral phase: Secondary | ICD-10-CM | POA: Diagnosis not present

## 2022-07-10 DIAGNOSIS — R2689 Other abnormalities of gait and mobility: Secondary | ICD-10-CM | POA: Diagnosis not present

## 2022-07-10 DIAGNOSIS — R293 Abnormal posture: Secondary | ICD-10-CM | POA: Diagnosis not present

## 2022-07-10 DIAGNOSIS — Z48811 Encounter for surgical aftercare following surgery on the nervous system: Secondary | ICD-10-CM | POA: Diagnosis not present

## 2022-07-10 DIAGNOSIS — M6281 Muscle weakness (generalized): Secondary | ICD-10-CM | POA: Diagnosis not present

## 2022-07-10 DIAGNOSIS — R1311 Dysphagia, oral phase: Secondary | ICD-10-CM | POA: Diagnosis not present

## 2022-07-11 DIAGNOSIS — R293 Abnormal posture: Secondary | ICD-10-CM | POA: Diagnosis not present

## 2022-07-11 DIAGNOSIS — R2689 Other abnormalities of gait and mobility: Secondary | ICD-10-CM | POA: Diagnosis not present

## 2022-07-11 DIAGNOSIS — Z48811 Encounter for surgical aftercare following surgery on the nervous system: Secondary | ICD-10-CM | POA: Diagnosis not present

## 2022-07-11 DIAGNOSIS — R1311 Dysphagia, oral phase: Secondary | ICD-10-CM | POA: Diagnosis not present

## 2022-07-11 DIAGNOSIS — M6281 Muscle weakness (generalized): Secondary | ICD-10-CM | POA: Diagnosis not present

## 2022-07-12 DIAGNOSIS — M6281 Muscle weakness (generalized): Secondary | ICD-10-CM | POA: Diagnosis not present

## 2022-07-12 DIAGNOSIS — R2689 Other abnormalities of gait and mobility: Secondary | ICD-10-CM | POA: Diagnosis not present

## 2022-07-12 DIAGNOSIS — R1311 Dysphagia, oral phase: Secondary | ICD-10-CM | POA: Diagnosis not present

## 2022-07-12 DIAGNOSIS — Z48811 Encounter for surgical aftercare following surgery on the nervous system: Secondary | ICD-10-CM | POA: Diagnosis not present

## 2022-07-12 DIAGNOSIS — R293 Abnormal posture: Secondary | ICD-10-CM | POA: Diagnosis not present

## 2022-07-13 DIAGNOSIS — Z48811 Encounter for surgical aftercare following surgery on the nervous system: Secondary | ICD-10-CM | POA: Diagnosis not present

## 2022-07-13 DIAGNOSIS — R2689 Other abnormalities of gait and mobility: Secondary | ICD-10-CM | POA: Diagnosis not present

## 2022-07-13 DIAGNOSIS — R293 Abnormal posture: Secondary | ICD-10-CM | POA: Diagnosis not present

## 2022-07-13 DIAGNOSIS — M6281 Muscle weakness (generalized): Secondary | ICD-10-CM | POA: Diagnosis not present

## 2022-07-13 DIAGNOSIS — R1311 Dysphagia, oral phase: Secondary | ICD-10-CM | POA: Diagnosis not present

## 2022-07-17 DIAGNOSIS — Z48811 Encounter for surgical aftercare following surgery on the nervous system: Secondary | ICD-10-CM | POA: Diagnosis not present

## 2022-07-17 DIAGNOSIS — R293 Abnormal posture: Secondary | ICD-10-CM | POA: Diagnosis not present

## 2022-07-17 DIAGNOSIS — M6281 Muscle weakness (generalized): Secondary | ICD-10-CM | POA: Diagnosis not present

## 2022-07-17 DIAGNOSIS — R1311 Dysphagia, oral phase: Secondary | ICD-10-CM | POA: Diagnosis not present

## 2022-07-17 DIAGNOSIS — R2689 Other abnormalities of gait and mobility: Secondary | ICD-10-CM | POA: Diagnosis not present

## 2022-07-18 DIAGNOSIS — M6281 Muscle weakness (generalized): Secondary | ICD-10-CM | POA: Diagnosis not present

## 2022-07-18 DIAGNOSIS — R1311 Dysphagia, oral phase: Secondary | ICD-10-CM | POA: Diagnosis not present

## 2022-07-18 DIAGNOSIS — R2689 Other abnormalities of gait and mobility: Secondary | ICD-10-CM | POA: Diagnosis not present

## 2022-07-18 DIAGNOSIS — R293 Abnormal posture: Secondary | ICD-10-CM | POA: Diagnosis not present

## 2022-07-18 DIAGNOSIS — Z48811 Encounter for surgical aftercare following surgery on the nervous system: Secondary | ICD-10-CM | POA: Diagnosis not present

## 2022-07-19 DIAGNOSIS — R2689 Other abnormalities of gait and mobility: Secondary | ICD-10-CM | POA: Diagnosis not present

## 2022-07-19 DIAGNOSIS — M6281 Muscle weakness (generalized): Secondary | ICD-10-CM | POA: Diagnosis not present

## 2022-07-19 DIAGNOSIS — R293 Abnormal posture: Secondary | ICD-10-CM | POA: Diagnosis not present

## 2022-07-19 DIAGNOSIS — Z48811 Encounter for surgical aftercare following surgery on the nervous system: Secondary | ICD-10-CM | POA: Diagnosis not present

## 2022-07-19 DIAGNOSIS — R1311 Dysphagia, oral phase: Secondary | ICD-10-CM | POA: Diagnosis not present

## 2022-07-20 DIAGNOSIS — R1311 Dysphagia, oral phase: Secondary | ICD-10-CM | POA: Diagnosis not present

## 2022-07-20 DIAGNOSIS — R2689 Other abnormalities of gait and mobility: Secondary | ICD-10-CM | POA: Diagnosis not present

## 2022-07-20 DIAGNOSIS — Z48811 Encounter for surgical aftercare following surgery on the nervous system: Secondary | ICD-10-CM | POA: Diagnosis not present

## 2022-07-20 DIAGNOSIS — R293 Abnormal posture: Secondary | ICD-10-CM | POA: Diagnosis not present

## 2022-07-20 DIAGNOSIS — M6281 Muscle weakness (generalized): Secondary | ICD-10-CM | POA: Diagnosis not present

## 2022-07-21 DIAGNOSIS — R1311 Dysphagia, oral phase: Secondary | ICD-10-CM | POA: Diagnosis not present

## 2022-07-21 DIAGNOSIS — R2689 Other abnormalities of gait and mobility: Secondary | ICD-10-CM | POA: Diagnosis not present

## 2022-07-21 DIAGNOSIS — M6281 Muscle weakness (generalized): Secondary | ICD-10-CM | POA: Diagnosis not present

## 2022-07-21 DIAGNOSIS — Z48811 Encounter for surgical aftercare following surgery on the nervous system: Secondary | ICD-10-CM | POA: Diagnosis not present

## 2022-07-21 DIAGNOSIS — R293 Abnormal posture: Secondary | ICD-10-CM | POA: Diagnosis not present

## 2022-07-24 DIAGNOSIS — M6281 Muscle weakness (generalized): Secondary | ICD-10-CM | POA: Diagnosis not present

## 2022-07-24 DIAGNOSIS — R293 Abnormal posture: Secondary | ICD-10-CM | POA: Diagnosis not present

## 2022-07-24 DIAGNOSIS — R1311 Dysphagia, oral phase: Secondary | ICD-10-CM | POA: Diagnosis not present

## 2022-07-24 DIAGNOSIS — R2689 Other abnormalities of gait and mobility: Secondary | ICD-10-CM | POA: Diagnosis not present

## 2022-07-24 DIAGNOSIS — Z48811 Encounter for surgical aftercare following surgery on the nervous system: Secondary | ICD-10-CM | POA: Diagnosis not present

## 2022-07-25 DIAGNOSIS — Z48811 Encounter for surgical aftercare following surgery on the nervous system: Secondary | ICD-10-CM | POA: Diagnosis not present

## 2022-07-25 DIAGNOSIS — R2689 Other abnormalities of gait and mobility: Secondary | ICD-10-CM | POA: Diagnosis not present

## 2022-07-25 DIAGNOSIS — M6281 Muscle weakness (generalized): Secondary | ICD-10-CM | POA: Diagnosis not present

## 2022-07-25 DIAGNOSIS — R293 Abnormal posture: Secondary | ICD-10-CM | POA: Diagnosis not present

## 2022-07-25 DIAGNOSIS — R1311 Dysphagia, oral phase: Secondary | ICD-10-CM | POA: Diagnosis not present

## 2022-07-26 DIAGNOSIS — R293 Abnormal posture: Secondary | ICD-10-CM | POA: Diagnosis not present

## 2022-07-26 DIAGNOSIS — Z48811 Encounter for surgical aftercare following surgery on the nervous system: Secondary | ICD-10-CM | POA: Diagnosis not present

## 2022-07-26 DIAGNOSIS — M6281 Muscle weakness (generalized): Secondary | ICD-10-CM | POA: Diagnosis not present

## 2022-07-26 DIAGNOSIS — R1311 Dysphagia, oral phase: Secondary | ICD-10-CM | POA: Diagnosis not present

## 2022-07-26 DIAGNOSIS — R2689 Other abnormalities of gait and mobility: Secondary | ICD-10-CM | POA: Diagnosis not present

## 2022-07-27 DIAGNOSIS — R1311 Dysphagia, oral phase: Secondary | ICD-10-CM | POA: Diagnosis not present

## 2022-07-27 DIAGNOSIS — Z48811 Encounter for surgical aftercare following surgery on the nervous system: Secondary | ICD-10-CM | POA: Diagnosis not present

## 2022-07-27 DIAGNOSIS — M6281 Muscle weakness (generalized): Secondary | ICD-10-CM | POA: Diagnosis not present

## 2022-07-27 DIAGNOSIS — R293 Abnormal posture: Secondary | ICD-10-CM | POA: Diagnosis not present

## 2022-07-27 DIAGNOSIS — R2689 Other abnormalities of gait and mobility: Secondary | ICD-10-CM | POA: Diagnosis not present

## 2022-07-28 DIAGNOSIS — R2689 Other abnormalities of gait and mobility: Secondary | ICD-10-CM | POA: Diagnosis not present

## 2022-07-28 DIAGNOSIS — R1311 Dysphagia, oral phase: Secondary | ICD-10-CM | POA: Diagnosis not present

## 2022-07-28 DIAGNOSIS — Z48811 Encounter for surgical aftercare following surgery on the nervous system: Secondary | ICD-10-CM | POA: Diagnosis not present

## 2022-07-28 DIAGNOSIS — R293 Abnormal posture: Secondary | ICD-10-CM | POA: Diagnosis not present

## 2022-07-28 DIAGNOSIS — M6281 Muscle weakness (generalized): Secondary | ICD-10-CM | POA: Diagnosis not present

## 2022-07-30 DIAGNOSIS — Z48811 Encounter for surgical aftercare following surgery on the nervous system: Secondary | ICD-10-CM | POA: Diagnosis not present

## 2022-07-30 DIAGNOSIS — M6281 Muscle weakness (generalized): Secondary | ICD-10-CM | POA: Diagnosis not present

## 2022-07-30 DIAGNOSIS — R1311 Dysphagia, oral phase: Secondary | ICD-10-CM | POA: Diagnosis not present

## 2022-07-30 DIAGNOSIS — R2689 Other abnormalities of gait and mobility: Secondary | ICD-10-CM | POA: Diagnosis not present

## 2022-07-30 DIAGNOSIS — R293 Abnormal posture: Secondary | ICD-10-CM | POA: Diagnosis not present

## 2022-07-31 DIAGNOSIS — Z48811 Encounter for surgical aftercare following surgery on the nervous system: Secondary | ICD-10-CM | POA: Diagnosis not present

## 2022-07-31 DIAGNOSIS — M6281 Muscle weakness (generalized): Secondary | ICD-10-CM | POA: Diagnosis not present

## 2022-07-31 DIAGNOSIS — R293 Abnormal posture: Secondary | ICD-10-CM | POA: Diagnosis not present

## 2022-07-31 DIAGNOSIS — R2689 Other abnormalities of gait and mobility: Secondary | ICD-10-CM | POA: Diagnosis not present

## 2022-07-31 DIAGNOSIS — R1311 Dysphagia, oral phase: Secondary | ICD-10-CM | POA: Diagnosis not present

## 2022-08-01 DIAGNOSIS — R1311 Dysphagia, oral phase: Secondary | ICD-10-CM | POA: Diagnosis not present

## 2022-08-01 DIAGNOSIS — R293 Abnormal posture: Secondary | ICD-10-CM | POA: Diagnosis not present

## 2022-08-01 DIAGNOSIS — R2689 Other abnormalities of gait and mobility: Secondary | ICD-10-CM | POA: Diagnosis not present

## 2022-08-01 DIAGNOSIS — Z48811 Encounter for surgical aftercare following surgery on the nervous system: Secondary | ICD-10-CM | POA: Diagnosis not present

## 2022-08-01 DIAGNOSIS — M6281 Muscle weakness (generalized): Secondary | ICD-10-CM | POA: Diagnosis not present

## 2022-08-02 DIAGNOSIS — R2689 Other abnormalities of gait and mobility: Secondary | ICD-10-CM | POA: Diagnosis not present

## 2022-08-02 DIAGNOSIS — M6281 Muscle weakness (generalized): Secondary | ICD-10-CM | POA: Diagnosis not present

## 2022-08-02 DIAGNOSIS — R293 Abnormal posture: Secondary | ICD-10-CM | POA: Diagnosis not present

## 2022-08-02 DIAGNOSIS — R1311 Dysphagia, oral phase: Secondary | ICD-10-CM | POA: Diagnosis not present

## 2022-08-02 DIAGNOSIS — Z48811 Encounter for surgical aftercare following surgery on the nervous system: Secondary | ICD-10-CM | POA: Diagnosis not present

## 2022-08-03 DIAGNOSIS — R2689 Other abnormalities of gait and mobility: Secondary | ICD-10-CM | POA: Diagnosis not present

## 2022-08-03 DIAGNOSIS — R293 Abnormal posture: Secondary | ICD-10-CM | POA: Diagnosis not present

## 2022-08-03 DIAGNOSIS — R1311 Dysphagia, oral phase: Secondary | ICD-10-CM | POA: Diagnosis not present

## 2022-08-03 DIAGNOSIS — Z48811 Encounter for surgical aftercare following surgery on the nervous system: Secondary | ICD-10-CM | POA: Diagnosis not present

## 2022-08-03 DIAGNOSIS — M6281 Muscle weakness (generalized): Secondary | ICD-10-CM | POA: Diagnosis not present

## 2022-08-05 DIAGNOSIS — Z48811 Encounter for surgical aftercare following surgery on the nervous system: Secondary | ICD-10-CM | POA: Diagnosis not present

## 2022-08-05 DIAGNOSIS — R293 Abnormal posture: Secondary | ICD-10-CM | POA: Diagnosis not present

## 2022-08-05 DIAGNOSIS — R2689 Other abnormalities of gait and mobility: Secondary | ICD-10-CM | POA: Diagnosis not present

## 2022-08-05 DIAGNOSIS — R1311 Dysphagia, oral phase: Secondary | ICD-10-CM | POA: Diagnosis not present

## 2022-08-05 DIAGNOSIS — M6281 Muscle weakness (generalized): Secondary | ICD-10-CM | POA: Diagnosis not present

## 2022-08-06 DIAGNOSIS — R2689 Other abnormalities of gait and mobility: Secondary | ICD-10-CM | POA: Diagnosis not present

## 2022-08-06 DIAGNOSIS — R293 Abnormal posture: Secondary | ICD-10-CM | POA: Diagnosis not present

## 2022-08-06 DIAGNOSIS — R1311 Dysphagia, oral phase: Secondary | ICD-10-CM | POA: Diagnosis not present

## 2022-08-06 DIAGNOSIS — M6281 Muscle weakness (generalized): Secondary | ICD-10-CM | POA: Diagnosis not present

## 2022-08-06 DIAGNOSIS — Z48811 Encounter for surgical aftercare following surgery on the nervous system: Secondary | ICD-10-CM | POA: Diagnosis not present

## 2022-08-07 DIAGNOSIS — R2689 Other abnormalities of gait and mobility: Secondary | ICD-10-CM | POA: Diagnosis not present

## 2022-08-07 DIAGNOSIS — R293 Abnormal posture: Secondary | ICD-10-CM | POA: Diagnosis not present

## 2022-08-07 DIAGNOSIS — M6281 Muscle weakness (generalized): Secondary | ICD-10-CM | POA: Diagnosis not present

## 2022-08-07 DIAGNOSIS — R1311 Dysphagia, oral phase: Secondary | ICD-10-CM | POA: Diagnosis not present

## 2022-08-07 DIAGNOSIS — Z48811 Encounter for surgical aftercare following surgery on the nervous system: Secondary | ICD-10-CM | POA: Diagnosis not present

## 2022-08-08 DIAGNOSIS — R1311 Dysphagia, oral phase: Secondary | ICD-10-CM | POA: Diagnosis not present

## 2022-08-08 DIAGNOSIS — M6281 Muscle weakness (generalized): Secondary | ICD-10-CM | POA: Diagnosis not present

## 2022-08-08 DIAGNOSIS — R293 Abnormal posture: Secondary | ICD-10-CM | POA: Diagnosis not present

## 2022-08-08 DIAGNOSIS — Z48811 Encounter for surgical aftercare following surgery on the nervous system: Secondary | ICD-10-CM | POA: Diagnosis not present

## 2022-08-08 DIAGNOSIS — R2689 Other abnormalities of gait and mobility: Secondary | ICD-10-CM | POA: Diagnosis not present

## 2022-08-09 DIAGNOSIS — R1311 Dysphagia, oral phase: Secondary | ICD-10-CM | POA: Diagnosis not present

## 2022-08-09 DIAGNOSIS — Z48811 Encounter for surgical aftercare following surgery on the nervous system: Secondary | ICD-10-CM | POA: Diagnosis not present

## 2022-08-09 DIAGNOSIS — R2689 Other abnormalities of gait and mobility: Secondary | ICD-10-CM | POA: Diagnosis not present

## 2022-08-09 DIAGNOSIS — R293 Abnormal posture: Secondary | ICD-10-CM | POA: Diagnosis not present

## 2022-08-09 DIAGNOSIS — M6281 Muscle weakness (generalized): Secondary | ICD-10-CM | POA: Diagnosis not present

## 2022-08-10 DIAGNOSIS — Z48811 Encounter for surgical aftercare following surgery on the nervous system: Secondary | ICD-10-CM | POA: Diagnosis not present

## 2022-08-10 DIAGNOSIS — R2689 Other abnormalities of gait and mobility: Secondary | ICD-10-CM | POA: Diagnosis not present

## 2022-08-10 DIAGNOSIS — R293 Abnormal posture: Secondary | ICD-10-CM | POA: Diagnosis not present

## 2022-08-10 DIAGNOSIS — R1311 Dysphagia, oral phase: Secondary | ICD-10-CM | POA: Diagnosis not present

## 2022-08-10 DIAGNOSIS — M6281 Muscle weakness (generalized): Secondary | ICD-10-CM | POA: Diagnosis not present

## 2022-08-11 DIAGNOSIS — R2689 Other abnormalities of gait and mobility: Secondary | ICD-10-CM | POA: Diagnosis not present

## 2022-08-11 DIAGNOSIS — R1311 Dysphagia, oral phase: Secondary | ICD-10-CM | POA: Diagnosis not present

## 2022-08-11 DIAGNOSIS — M6281 Muscle weakness (generalized): Secondary | ICD-10-CM | POA: Diagnosis not present

## 2022-08-11 DIAGNOSIS — Z48811 Encounter for surgical aftercare following surgery on the nervous system: Secondary | ICD-10-CM | POA: Diagnosis not present

## 2022-08-11 DIAGNOSIS — R293 Abnormal posture: Secondary | ICD-10-CM | POA: Diagnosis not present

## 2022-08-13 DIAGNOSIS — Z48811 Encounter for surgical aftercare following surgery on the nervous system: Secondary | ICD-10-CM | POA: Diagnosis not present

## 2022-08-13 DIAGNOSIS — R293 Abnormal posture: Secondary | ICD-10-CM | POA: Diagnosis not present

## 2022-08-13 DIAGNOSIS — R1311 Dysphagia, oral phase: Secondary | ICD-10-CM | POA: Diagnosis not present

## 2022-08-13 DIAGNOSIS — R2689 Other abnormalities of gait and mobility: Secondary | ICD-10-CM | POA: Diagnosis not present

## 2022-08-13 DIAGNOSIS — M6281 Muscle weakness (generalized): Secondary | ICD-10-CM | POA: Diagnosis not present

## 2022-08-14 DIAGNOSIS — Z48811 Encounter for surgical aftercare following surgery on the nervous system: Secondary | ICD-10-CM | POA: Diagnosis not present

## 2022-08-14 DIAGNOSIS — R2689 Other abnormalities of gait and mobility: Secondary | ICD-10-CM | POA: Diagnosis not present

## 2022-08-14 DIAGNOSIS — M6281 Muscle weakness (generalized): Secondary | ICD-10-CM | POA: Diagnosis not present

## 2022-08-14 DIAGNOSIS — R293 Abnormal posture: Secondary | ICD-10-CM | POA: Diagnosis not present

## 2022-08-14 DIAGNOSIS — R1311 Dysphagia, oral phase: Secondary | ICD-10-CM | POA: Diagnosis not present

## 2022-08-15 DIAGNOSIS — Z48811 Encounter for surgical aftercare following surgery on the nervous system: Secondary | ICD-10-CM | POA: Diagnosis not present

## 2022-08-15 DIAGNOSIS — R293 Abnormal posture: Secondary | ICD-10-CM | POA: Diagnosis not present

## 2022-08-15 DIAGNOSIS — R1311 Dysphagia, oral phase: Secondary | ICD-10-CM | POA: Diagnosis not present

## 2022-08-15 DIAGNOSIS — M6281 Muscle weakness (generalized): Secondary | ICD-10-CM | POA: Diagnosis not present

## 2022-08-15 DIAGNOSIS — R2689 Other abnormalities of gait and mobility: Secondary | ICD-10-CM | POA: Diagnosis not present

## 2022-08-16 DIAGNOSIS — R2689 Other abnormalities of gait and mobility: Secondary | ICD-10-CM | POA: Diagnosis not present

## 2022-08-16 DIAGNOSIS — R293 Abnormal posture: Secondary | ICD-10-CM | POA: Diagnosis not present

## 2022-08-16 DIAGNOSIS — M6281 Muscle weakness (generalized): Secondary | ICD-10-CM | POA: Diagnosis not present

## 2022-08-16 DIAGNOSIS — Z48811 Encounter for surgical aftercare following surgery on the nervous system: Secondary | ICD-10-CM | POA: Diagnosis not present

## 2022-08-16 DIAGNOSIS — R1311 Dysphagia, oral phase: Secondary | ICD-10-CM | POA: Diagnosis not present

## 2022-08-17 DIAGNOSIS — R1311 Dysphagia, oral phase: Secondary | ICD-10-CM | POA: Diagnosis not present

## 2022-08-17 DIAGNOSIS — R293 Abnormal posture: Secondary | ICD-10-CM | POA: Diagnosis not present

## 2022-08-17 DIAGNOSIS — Z48811 Encounter for surgical aftercare following surgery on the nervous system: Secondary | ICD-10-CM | POA: Diagnosis not present

## 2022-08-17 DIAGNOSIS — M6281 Muscle weakness (generalized): Secondary | ICD-10-CM | POA: Diagnosis not present

## 2022-08-17 DIAGNOSIS — R2689 Other abnormalities of gait and mobility: Secondary | ICD-10-CM | POA: Diagnosis not present

## 2022-08-20 DIAGNOSIS — R1311 Dysphagia, oral phase: Secondary | ICD-10-CM | POA: Diagnosis not present

## 2022-08-20 DIAGNOSIS — R293 Abnormal posture: Secondary | ICD-10-CM | POA: Diagnosis not present

## 2022-08-20 DIAGNOSIS — Z48811 Encounter for surgical aftercare following surgery on the nervous system: Secondary | ICD-10-CM | POA: Diagnosis not present

## 2022-08-20 DIAGNOSIS — M6281 Muscle weakness (generalized): Secondary | ICD-10-CM | POA: Diagnosis not present

## 2022-08-20 DIAGNOSIS — R2689 Other abnormalities of gait and mobility: Secondary | ICD-10-CM | POA: Diagnosis not present

## 2022-08-21 DIAGNOSIS — R293 Abnormal posture: Secondary | ICD-10-CM | POA: Diagnosis not present

## 2022-08-21 DIAGNOSIS — R2689 Other abnormalities of gait and mobility: Secondary | ICD-10-CM | POA: Diagnosis not present

## 2022-08-21 DIAGNOSIS — Z48811 Encounter for surgical aftercare following surgery on the nervous system: Secondary | ICD-10-CM | POA: Diagnosis not present

## 2022-08-21 DIAGNOSIS — M6281 Muscle weakness (generalized): Secondary | ICD-10-CM | POA: Diagnosis not present

## 2022-08-21 DIAGNOSIS — R1311 Dysphagia, oral phase: Secondary | ICD-10-CM | POA: Diagnosis not present

## 2022-08-22 DIAGNOSIS — R293 Abnormal posture: Secondary | ICD-10-CM | POA: Diagnosis not present

## 2022-08-22 DIAGNOSIS — M6281 Muscle weakness (generalized): Secondary | ICD-10-CM | POA: Diagnosis not present

## 2022-08-22 DIAGNOSIS — Z48811 Encounter for surgical aftercare following surgery on the nervous system: Secondary | ICD-10-CM | POA: Diagnosis not present

## 2022-08-22 DIAGNOSIS — R2689 Other abnormalities of gait and mobility: Secondary | ICD-10-CM | POA: Diagnosis not present

## 2022-08-22 DIAGNOSIS — R1311 Dysphagia, oral phase: Secondary | ICD-10-CM | POA: Diagnosis not present

## 2022-08-23 DIAGNOSIS — R2689 Other abnormalities of gait and mobility: Secondary | ICD-10-CM | POA: Diagnosis not present

## 2022-08-23 DIAGNOSIS — M6281 Muscle weakness (generalized): Secondary | ICD-10-CM | POA: Diagnosis not present

## 2022-08-23 DIAGNOSIS — R293 Abnormal posture: Secondary | ICD-10-CM | POA: Diagnosis not present

## 2022-08-23 DIAGNOSIS — Z48811 Encounter for surgical aftercare following surgery on the nervous system: Secondary | ICD-10-CM | POA: Diagnosis not present

## 2022-08-23 DIAGNOSIS — R1311 Dysphagia, oral phase: Secondary | ICD-10-CM | POA: Diagnosis not present

## 2022-08-30 DIAGNOSIS — Z23 Encounter for immunization: Secondary | ICD-10-CM | POA: Diagnosis not present

## 2022-09-01 DIAGNOSIS — I10 Essential (primary) hypertension: Secondary | ICD-10-CM | POA: Diagnosis not present

## 2022-09-01 DIAGNOSIS — G894 Chronic pain syndrome: Secondary | ICD-10-CM | POA: Diagnosis not present

## 2022-09-01 DIAGNOSIS — G959 Disease of spinal cord, unspecified: Secondary | ICD-10-CM | POA: Diagnosis not present

## 2022-09-14 DIAGNOSIS — B351 Tinea unguium: Secondary | ICD-10-CM | POA: Diagnosis not present

## 2022-09-14 DIAGNOSIS — I739 Peripheral vascular disease, unspecified: Secondary | ICD-10-CM | POA: Diagnosis not present

## 2022-09-14 DIAGNOSIS — M2041 Other hammer toe(s) (acquired), right foot: Secondary | ICD-10-CM | POA: Diagnosis not present

## 2022-09-14 DIAGNOSIS — M2042 Other hammer toe(s) (acquired), left foot: Secondary | ICD-10-CM | POA: Diagnosis not present

## 2022-10-05 DIAGNOSIS — R059 Cough, unspecified: Secondary | ICD-10-CM | POA: Diagnosis not present

## 2022-10-08 DIAGNOSIS — G894 Chronic pain syndrome: Secondary | ICD-10-CM | POA: Diagnosis not present

## 2022-10-08 DIAGNOSIS — R1312 Dysphagia, oropharyngeal phase: Secondary | ICD-10-CM | POA: Diagnosis not present

## 2022-10-09 DIAGNOSIS — R1312 Dysphagia, oropharyngeal phase: Secondary | ICD-10-CM | POA: Diagnosis not present

## 2022-10-09 DIAGNOSIS — G894 Chronic pain syndrome: Secondary | ICD-10-CM | POA: Diagnosis not present

## 2022-10-10 DIAGNOSIS — G894 Chronic pain syndrome: Secondary | ICD-10-CM | POA: Diagnosis not present

## 2022-10-10 DIAGNOSIS — R1312 Dysphagia, oropharyngeal phase: Secondary | ICD-10-CM | POA: Diagnosis not present

## 2022-10-11 DIAGNOSIS — R1312 Dysphagia, oropharyngeal phase: Secondary | ICD-10-CM | POA: Diagnosis not present

## 2022-10-11 DIAGNOSIS — G894 Chronic pain syndrome: Secondary | ICD-10-CM | POA: Diagnosis not present

## 2022-10-15 DIAGNOSIS — R1312 Dysphagia, oropharyngeal phase: Secondary | ICD-10-CM | POA: Diagnosis not present

## 2022-10-15 DIAGNOSIS — G894 Chronic pain syndrome: Secondary | ICD-10-CM | POA: Diagnosis not present

## 2022-10-18 DIAGNOSIS — G894 Chronic pain syndrome: Secondary | ICD-10-CM | POA: Diagnosis not present

## 2022-10-18 DIAGNOSIS — R1312 Dysphagia, oropharyngeal phase: Secondary | ICD-10-CM | POA: Diagnosis not present

## 2022-10-23 DIAGNOSIS — G894 Chronic pain syndrome: Secondary | ICD-10-CM | POA: Diagnosis not present

## 2022-10-23 DIAGNOSIS — R1312 Dysphagia, oropharyngeal phase: Secondary | ICD-10-CM | POA: Diagnosis not present

## 2022-10-24 DIAGNOSIS — R2689 Other abnormalities of gait and mobility: Secondary | ICD-10-CM | POA: Diagnosis not present

## 2022-10-24 DIAGNOSIS — M6281 Muscle weakness (generalized): Secondary | ICD-10-CM | POA: Diagnosis not present

## 2022-10-24 DIAGNOSIS — R293 Abnormal posture: Secondary | ICD-10-CM | POA: Diagnosis not present

## 2022-10-24 DIAGNOSIS — G894 Chronic pain syndrome: Secondary | ICD-10-CM | POA: Diagnosis not present

## 2022-10-24 DIAGNOSIS — R531 Weakness: Secondary | ICD-10-CM | POA: Diagnosis not present

## 2022-10-25 DIAGNOSIS — R531 Weakness: Secondary | ICD-10-CM | POA: Diagnosis not present

## 2022-10-25 DIAGNOSIS — R293 Abnormal posture: Secondary | ICD-10-CM | POA: Diagnosis not present

## 2022-10-25 DIAGNOSIS — M6281 Muscle weakness (generalized): Secondary | ICD-10-CM | POA: Diagnosis not present

## 2022-10-25 DIAGNOSIS — G894 Chronic pain syndrome: Secondary | ICD-10-CM | POA: Diagnosis not present

## 2022-10-25 DIAGNOSIS — R2689 Other abnormalities of gait and mobility: Secondary | ICD-10-CM | POA: Diagnosis not present

## 2022-10-31 DIAGNOSIS — R293 Abnormal posture: Secondary | ICD-10-CM | POA: Diagnosis not present

## 2022-10-31 DIAGNOSIS — R2689 Other abnormalities of gait and mobility: Secondary | ICD-10-CM | POA: Diagnosis not present

## 2022-10-31 DIAGNOSIS — M6281 Muscle weakness (generalized): Secondary | ICD-10-CM | POA: Diagnosis not present

## 2022-10-31 DIAGNOSIS — G894 Chronic pain syndrome: Secondary | ICD-10-CM | POA: Diagnosis not present

## 2022-10-31 DIAGNOSIS — R531 Weakness: Secondary | ICD-10-CM | POA: Diagnosis not present

## 2022-11-01 DIAGNOSIS — R2689 Other abnormalities of gait and mobility: Secondary | ICD-10-CM | POA: Diagnosis not present

## 2022-11-01 DIAGNOSIS — M6281 Muscle weakness (generalized): Secondary | ICD-10-CM | POA: Diagnosis not present

## 2022-11-01 DIAGNOSIS — R531 Weakness: Secondary | ICD-10-CM | POA: Diagnosis not present

## 2022-11-01 DIAGNOSIS — R293 Abnormal posture: Secondary | ICD-10-CM | POA: Diagnosis not present

## 2022-11-01 DIAGNOSIS — G894 Chronic pain syndrome: Secondary | ICD-10-CM | POA: Diagnosis not present

## 2022-11-04 DIAGNOSIS — I10 Essential (primary) hypertension: Secondary | ICD-10-CM | POA: Diagnosis not present

## 2022-11-04 DIAGNOSIS — G959 Disease of spinal cord, unspecified: Secondary | ICD-10-CM | POA: Diagnosis not present

## 2022-11-04 DIAGNOSIS — G894 Chronic pain syndrome: Secondary | ICD-10-CM | POA: Diagnosis not present

## 2022-11-05 DIAGNOSIS — M6281 Muscle weakness (generalized): Secondary | ICD-10-CM | POA: Diagnosis not present

## 2022-11-05 DIAGNOSIS — R531 Weakness: Secondary | ICD-10-CM | POA: Diagnosis not present

## 2022-11-05 DIAGNOSIS — G894 Chronic pain syndrome: Secondary | ICD-10-CM | POA: Diagnosis not present

## 2022-11-05 DIAGNOSIS — R293 Abnormal posture: Secondary | ICD-10-CM | POA: Diagnosis not present

## 2022-11-05 DIAGNOSIS — R2689 Other abnormalities of gait and mobility: Secondary | ICD-10-CM | POA: Diagnosis not present

## 2022-11-06 DIAGNOSIS — R531 Weakness: Secondary | ICD-10-CM | POA: Diagnosis not present

## 2022-11-06 DIAGNOSIS — R293 Abnormal posture: Secondary | ICD-10-CM | POA: Diagnosis not present

## 2022-11-06 DIAGNOSIS — R2689 Other abnormalities of gait and mobility: Secondary | ICD-10-CM | POA: Diagnosis not present

## 2022-11-06 DIAGNOSIS — M6281 Muscle weakness (generalized): Secondary | ICD-10-CM | POA: Diagnosis not present

## 2022-11-06 DIAGNOSIS — G894 Chronic pain syndrome: Secondary | ICD-10-CM | POA: Diagnosis not present

## 2022-11-07 DIAGNOSIS — M6281 Muscle weakness (generalized): Secondary | ICD-10-CM | POA: Diagnosis not present

## 2022-11-07 DIAGNOSIS — R2689 Other abnormalities of gait and mobility: Secondary | ICD-10-CM | POA: Diagnosis not present

## 2022-11-07 DIAGNOSIS — R531 Weakness: Secondary | ICD-10-CM | POA: Diagnosis not present

## 2022-11-07 DIAGNOSIS — G894 Chronic pain syndrome: Secondary | ICD-10-CM | POA: Diagnosis not present

## 2022-11-07 DIAGNOSIS — R293 Abnormal posture: Secondary | ICD-10-CM | POA: Diagnosis not present

## 2022-11-08 DIAGNOSIS — M6281 Muscle weakness (generalized): Secondary | ICD-10-CM | POA: Diagnosis not present

## 2022-11-08 DIAGNOSIS — G894 Chronic pain syndrome: Secondary | ICD-10-CM | POA: Diagnosis not present

## 2022-11-08 DIAGNOSIS — R293 Abnormal posture: Secondary | ICD-10-CM | POA: Diagnosis not present

## 2022-11-08 DIAGNOSIS — R2689 Other abnormalities of gait and mobility: Secondary | ICD-10-CM | POA: Diagnosis not present

## 2022-11-08 DIAGNOSIS — R531 Weakness: Secondary | ICD-10-CM | POA: Diagnosis not present

## 2022-11-12 DIAGNOSIS — R2689 Other abnormalities of gait and mobility: Secondary | ICD-10-CM | POA: Diagnosis not present

## 2022-11-12 DIAGNOSIS — M6281 Muscle weakness (generalized): Secondary | ICD-10-CM | POA: Diagnosis not present

## 2022-11-12 DIAGNOSIS — R293 Abnormal posture: Secondary | ICD-10-CM | POA: Diagnosis not present

## 2022-11-12 DIAGNOSIS — R531 Weakness: Secondary | ICD-10-CM | POA: Diagnosis not present

## 2022-11-12 DIAGNOSIS — G894 Chronic pain syndrome: Secondary | ICD-10-CM | POA: Diagnosis not present

## 2022-11-13 DIAGNOSIS — R2689 Other abnormalities of gait and mobility: Secondary | ICD-10-CM | POA: Diagnosis not present

## 2022-11-13 DIAGNOSIS — G894 Chronic pain syndrome: Secondary | ICD-10-CM | POA: Diagnosis not present

## 2022-11-13 DIAGNOSIS — R293 Abnormal posture: Secondary | ICD-10-CM | POA: Diagnosis not present

## 2022-11-13 DIAGNOSIS — R531 Weakness: Secondary | ICD-10-CM | POA: Diagnosis not present

## 2022-11-13 DIAGNOSIS — M6281 Muscle weakness (generalized): Secondary | ICD-10-CM | POA: Diagnosis not present

## 2022-11-14 DIAGNOSIS — R2689 Other abnormalities of gait and mobility: Secondary | ICD-10-CM | POA: Diagnosis not present

## 2022-11-14 DIAGNOSIS — M6281 Muscle weakness (generalized): Secondary | ICD-10-CM | POA: Diagnosis not present

## 2022-11-14 DIAGNOSIS — G894 Chronic pain syndrome: Secondary | ICD-10-CM | POA: Diagnosis not present

## 2022-11-14 DIAGNOSIS — R531 Weakness: Secondary | ICD-10-CM | POA: Diagnosis not present

## 2022-11-14 DIAGNOSIS — R293 Abnormal posture: Secondary | ICD-10-CM | POA: Diagnosis not present

## 2022-11-15 DIAGNOSIS — R293 Abnormal posture: Secondary | ICD-10-CM | POA: Diagnosis not present

## 2022-11-15 DIAGNOSIS — R2689 Other abnormalities of gait and mobility: Secondary | ICD-10-CM | POA: Diagnosis not present

## 2022-11-15 DIAGNOSIS — M6281 Muscle weakness (generalized): Secondary | ICD-10-CM | POA: Diagnosis not present

## 2022-11-15 DIAGNOSIS — R531 Weakness: Secondary | ICD-10-CM | POA: Diagnosis not present

## 2022-11-15 DIAGNOSIS — G894 Chronic pain syndrome: Secondary | ICD-10-CM | POA: Diagnosis not present

## 2022-11-17 DIAGNOSIS — G894 Chronic pain syndrome: Secondary | ICD-10-CM | POA: Diagnosis not present

## 2022-11-17 DIAGNOSIS — M6281 Muscle weakness (generalized): Secondary | ICD-10-CM | POA: Diagnosis not present

## 2022-11-17 DIAGNOSIS — R293 Abnormal posture: Secondary | ICD-10-CM | POA: Diagnosis not present

## 2022-11-17 DIAGNOSIS — R531 Weakness: Secondary | ICD-10-CM | POA: Diagnosis not present

## 2022-11-17 DIAGNOSIS — R2689 Other abnormalities of gait and mobility: Secondary | ICD-10-CM | POA: Diagnosis not present

## 2022-11-19 DIAGNOSIS — G894 Chronic pain syndrome: Secondary | ICD-10-CM | POA: Diagnosis not present

## 2022-11-19 DIAGNOSIS — M6281 Muscle weakness (generalized): Secondary | ICD-10-CM | POA: Diagnosis not present

## 2022-11-19 DIAGNOSIS — R2689 Other abnormalities of gait and mobility: Secondary | ICD-10-CM | POA: Diagnosis not present

## 2022-11-19 DIAGNOSIS — R293 Abnormal posture: Secondary | ICD-10-CM | POA: Diagnosis not present

## 2022-11-19 DIAGNOSIS — R531 Weakness: Secondary | ICD-10-CM | POA: Diagnosis not present

## 2022-11-20 DIAGNOSIS — G894 Chronic pain syndrome: Secondary | ICD-10-CM | POA: Diagnosis not present

## 2022-11-20 DIAGNOSIS — R531 Weakness: Secondary | ICD-10-CM | POA: Diagnosis not present

## 2022-11-20 DIAGNOSIS — R2689 Other abnormalities of gait and mobility: Secondary | ICD-10-CM | POA: Diagnosis not present

## 2022-11-20 DIAGNOSIS — R293 Abnormal posture: Secondary | ICD-10-CM | POA: Diagnosis not present

## 2022-11-20 DIAGNOSIS — M6281 Muscle weakness (generalized): Secondary | ICD-10-CM | POA: Diagnosis not present

## 2022-11-21 DIAGNOSIS — R531 Weakness: Secondary | ICD-10-CM | POA: Diagnosis not present

## 2022-11-21 DIAGNOSIS — R293 Abnormal posture: Secondary | ICD-10-CM | POA: Diagnosis not present

## 2022-11-21 DIAGNOSIS — R2689 Other abnormalities of gait and mobility: Secondary | ICD-10-CM | POA: Diagnosis not present

## 2022-11-21 DIAGNOSIS — G894 Chronic pain syndrome: Secondary | ICD-10-CM | POA: Diagnosis not present

## 2022-11-21 DIAGNOSIS — M6281 Muscle weakness (generalized): Secondary | ICD-10-CM | POA: Diagnosis not present

## 2022-11-22 DIAGNOSIS — M6281 Muscle weakness (generalized): Secondary | ICD-10-CM | POA: Diagnosis not present

## 2022-11-22 DIAGNOSIS — R293 Abnormal posture: Secondary | ICD-10-CM | POA: Diagnosis not present

## 2022-11-22 DIAGNOSIS — R2689 Other abnormalities of gait and mobility: Secondary | ICD-10-CM | POA: Diagnosis not present

## 2022-11-22 DIAGNOSIS — G894 Chronic pain syndrome: Secondary | ICD-10-CM | POA: Diagnosis not present

## 2022-11-22 DIAGNOSIS — R531 Weakness: Secondary | ICD-10-CM | POA: Diagnosis not present

## 2022-11-23 DIAGNOSIS — R1312 Dysphagia, oropharyngeal phase: Secondary | ICD-10-CM | POA: Diagnosis not present

## 2022-11-23 DIAGNOSIS — G894 Chronic pain syndrome: Secondary | ICD-10-CM | POA: Diagnosis not present

## 2022-11-23 DIAGNOSIS — M6281 Muscle weakness (generalized): Secondary | ICD-10-CM | POA: Diagnosis not present

## 2022-11-23 DIAGNOSIS — R531 Weakness: Secondary | ICD-10-CM | POA: Diagnosis not present

## 2022-11-23 DIAGNOSIS — R293 Abnormal posture: Secondary | ICD-10-CM | POA: Diagnosis not present

## 2022-11-23 DIAGNOSIS — R2689 Other abnormalities of gait and mobility: Secondary | ICD-10-CM | POA: Diagnosis not present

## 2022-11-26 DIAGNOSIS — R531 Weakness: Secondary | ICD-10-CM | POA: Diagnosis not present

## 2022-11-26 DIAGNOSIS — R1312 Dysphagia, oropharyngeal phase: Secondary | ICD-10-CM | POA: Diagnosis not present

## 2022-11-26 DIAGNOSIS — G894 Chronic pain syndrome: Secondary | ICD-10-CM | POA: Diagnosis not present

## 2022-11-26 DIAGNOSIS — M6281 Muscle weakness (generalized): Secondary | ICD-10-CM | POA: Diagnosis not present

## 2022-11-26 DIAGNOSIS — R293 Abnormal posture: Secondary | ICD-10-CM | POA: Diagnosis not present

## 2022-11-26 DIAGNOSIS — R2689 Other abnormalities of gait and mobility: Secondary | ICD-10-CM | POA: Diagnosis not present

## 2022-11-27 DIAGNOSIS — R1312 Dysphagia, oropharyngeal phase: Secondary | ICD-10-CM | POA: Diagnosis not present

## 2022-11-27 DIAGNOSIS — R293 Abnormal posture: Secondary | ICD-10-CM | POA: Diagnosis not present

## 2022-11-27 DIAGNOSIS — R2689 Other abnormalities of gait and mobility: Secondary | ICD-10-CM | POA: Diagnosis not present

## 2022-11-27 DIAGNOSIS — R531 Weakness: Secondary | ICD-10-CM | POA: Diagnosis not present

## 2022-11-27 DIAGNOSIS — G894 Chronic pain syndrome: Secondary | ICD-10-CM | POA: Diagnosis not present

## 2022-11-27 DIAGNOSIS — M6281 Muscle weakness (generalized): Secondary | ICD-10-CM | POA: Diagnosis not present

## 2022-11-28 DIAGNOSIS — M6281 Muscle weakness (generalized): Secondary | ICD-10-CM | POA: Diagnosis not present

## 2022-11-28 DIAGNOSIS — R2689 Other abnormalities of gait and mobility: Secondary | ICD-10-CM | POA: Diagnosis not present

## 2022-11-28 DIAGNOSIS — R293 Abnormal posture: Secondary | ICD-10-CM | POA: Diagnosis not present

## 2022-11-28 DIAGNOSIS — R531 Weakness: Secondary | ICD-10-CM | POA: Diagnosis not present

## 2022-11-28 DIAGNOSIS — R1312 Dysphagia, oropharyngeal phase: Secondary | ICD-10-CM | POA: Diagnosis not present

## 2022-11-28 DIAGNOSIS — G894 Chronic pain syndrome: Secondary | ICD-10-CM | POA: Diagnosis not present

## 2022-11-29 DIAGNOSIS — R293 Abnormal posture: Secondary | ICD-10-CM | POA: Diagnosis not present

## 2022-11-29 DIAGNOSIS — R531 Weakness: Secondary | ICD-10-CM | POA: Diagnosis not present

## 2022-11-29 DIAGNOSIS — R1312 Dysphagia, oropharyngeal phase: Secondary | ICD-10-CM | POA: Diagnosis not present

## 2022-11-29 DIAGNOSIS — G894 Chronic pain syndrome: Secondary | ICD-10-CM | POA: Diagnosis not present

## 2022-11-29 DIAGNOSIS — M6281 Muscle weakness (generalized): Secondary | ICD-10-CM | POA: Diagnosis not present

## 2022-11-29 DIAGNOSIS — R2689 Other abnormalities of gait and mobility: Secondary | ICD-10-CM | POA: Diagnosis not present

## 2022-11-30 DIAGNOSIS — R531 Weakness: Secondary | ICD-10-CM | POA: Diagnosis not present

## 2022-11-30 DIAGNOSIS — M6281 Muscle weakness (generalized): Secondary | ICD-10-CM | POA: Diagnosis not present

## 2022-11-30 DIAGNOSIS — R293 Abnormal posture: Secondary | ICD-10-CM | POA: Diagnosis not present

## 2022-11-30 DIAGNOSIS — R1312 Dysphagia, oropharyngeal phase: Secondary | ICD-10-CM | POA: Diagnosis not present

## 2022-11-30 DIAGNOSIS — G894 Chronic pain syndrome: Secondary | ICD-10-CM | POA: Diagnosis not present

## 2022-11-30 DIAGNOSIS — R2689 Other abnormalities of gait and mobility: Secondary | ICD-10-CM | POA: Diagnosis not present

## 2022-12-03 DIAGNOSIS — M2041 Other hammer toe(s) (acquired), right foot: Secondary | ICD-10-CM | POA: Diagnosis not present

## 2022-12-03 DIAGNOSIS — B351 Tinea unguium: Secondary | ICD-10-CM | POA: Diagnosis not present

## 2022-12-03 DIAGNOSIS — M2042 Other hammer toe(s) (acquired), left foot: Secondary | ICD-10-CM | POA: Diagnosis not present

## 2022-12-03 DIAGNOSIS — I739 Peripheral vascular disease, unspecified: Secondary | ICD-10-CM | POA: Diagnosis not present

## 2022-12-04 DIAGNOSIS — M6281 Muscle weakness (generalized): Secondary | ICD-10-CM | POA: Diagnosis not present

## 2022-12-04 DIAGNOSIS — R2689 Other abnormalities of gait and mobility: Secondary | ICD-10-CM | POA: Diagnosis not present

## 2022-12-04 DIAGNOSIS — R531 Weakness: Secondary | ICD-10-CM | POA: Diagnosis not present

## 2022-12-04 DIAGNOSIS — R1312 Dysphagia, oropharyngeal phase: Secondary | ICD-10-CM | POA: Diagnosis not present

## 2022-12-04 DIAGNOSIS — G894 Chronic pain syndrome: Secondary | ICD-10-CM | POA: Diagnosis not present

## 2022-12-04 DIAGNOSIS — R293 Abnormal posture: Secondary | ICD-10-CM | POA: Diagnosis not present

## 2022-12-05 DIAGNOSIS — G894 Chronic pain syndrome: Secondary | ICD-10-CM | POA: Diagnosis not present

## 2022-12-05 DIAGNOSIS — R531 Weakness: Secondary | ICD-10-CM | POA: Diagnosis not present

## 2022-12-05 DIAGNOSIS — R293 Abnormal posture: Secondary | ICD-10-CM | POA: Diagnosis not present

## 2022-12-05 DIAGNOSIS — R2689 Other abnormalities of gait and mobility: Secondary | ICD-10-CM | POA: Diagnosis not present

## 2022-12-05 DIAGNOSIS — R1312 Dysphagia, oropharyngeal phase: Secondary | ICD-10-CM | POA: Diagnosis not present

## 2022-12-05 DIAGNOSIS — M6281 Muscle weakness (generalized): Secondary | ICD-10-CM | POA: Diagnosis not present

## 2022-12-06 DIAGNOSIS — G894 Chronic pain syndrome: Secondary | ICD-10-CM | POA: Diagnosis not present

## 2022-12-06 DIAGNOSIS — M6281 Muscle weakness (generalized): Secondary | ICD-10-CM | POA: Diagnosis not present

## 2022-12-06 DIAGNOSIS — R531 Weakness: Secondary | ICD-10-CM | POA: Diagnosis not present

## 2022-12-06 DIAGNOSIS — R1312 Dysphagia, oropharyngeal phase: Secondary | ICD-10-CM | POA: Diagnosis not present

## 2022-12-06 DIAGNOSIS — R293 Abnormal posture: Secondary | ICD-10-CM | POA: Diagnosis not present

## 2022-12-06 DIAGNOSIS — R2689 Other abnormalities of gait and mobility: Secondary | ICD-10-CM | POA: Diagnosis not present

## 2022-12-07 DIAGNOSIS — R1312 Dysphagia, oropharyngeal phase: Secondary | ICD-10-CM | POA: Diagnosis not present

## 2022-12-07 DIAGNOSIS — G894 Chronic pain syndrome: Secondary | ICD-10-CM | POA: Diagnosis not present

## 2022-12-07 DIAGNOSIS — M6281 Muscle weakness (generalized): Secondary | ICD-10-CM | POA: Diagnosis not present

## 2022-12-07 DIAGNOSIS — R531 Weakness: Secondary | ICD-10-CM | POA: Diagnosis not present

## 2022-12-07 DIAGNOSIS — R2689 Other abnormalities of gait and mobility: Secondary | ICD-10-CM | POA: Diagnosis not present

## 2022-12-07 DIAGNOSIS — R293 Abnormal posture: Secondary | ICD-10-CM | POA: Diagnosis not present

## 2022-12-09 DIAGNOSIS — M6281 Muscle weakness (generalized): Secondary | ICD-10-CM | POA: Diagnosis not present

## 2022-12-09 DIAGNOSIS — R293 Abnormal posture: Secondary | ICD-10-CM | POA: Diagnosis not present

## 2022-12-09 DIAGNOSIS — R531 Weakness: Secondary | ICD-10-CM | POA: Diagnosis not present

## 2022-12-09 DIAGNOSIS — G894 Chronic pain syndrome: Secondary | ICD-10-CM | POA: Diagnosis not present

## 2022-12-09 DIAGNOSIS — R2689 Other abnormalities of gait and mobility: Secondary | ICD-10-CM | POA: Diagnosis not present

## 2022-12-09 DIAGNOSIS — R1312 Dysphagia, oropharyngeal phase: Secondary | ICD-10-CM | POA: Diagnosis not present

## 2022-12-11 DIAGNOSIS — G894 Chronic pain syndrome: Secondary | ICD-10-CM | POA: Diagnosis not present

## 2022-12-11 DIAGNOSIS — M6281 Muscle weakness (generalized): Secondary | ICD-10-CM | POA: Diagnosis not present

## 2022-12-11 DIAGNOSIS — R1312 Dysphagia, oropharyngeal phase: Secondary | ICD-10-CM | POA: Diagnosis not present

## 2022-12-11 DIAGNOSIS — R2689 Other abnormalities of gait and mobility: Secondary | ICD-10-CM | POA: Diagnosis not present

## 2022-12-11 DIAGNOSIS — R293 Abnormal posture: Secondary | ICD-10-CM | POA: Diagnosis not present

## 2022-12-11 DIAGNOSIS — R531 Weakness: Secondary | ICD-10-CM | POA: Diagnosis not present

## 2022-12-12 DIAGNOSIS — R1312 Dysphagia, oropharyngeal phase: Secondary | ICD-10-CM | POA: Diagnosis not present

## 2022-12-12 DIAGNOSIS — M6281 Muscle weakness (generalized): Secondary | ICD-10-CM | POA: Diagnosis not present

## 2022-12-12 DIAGNOSIS — R2689 Other abnormalities of gait and mobility: Secondary | ICD-10-CM | POA: Diagnosis not present

## 2022-12-12 DIAGNOSIS — R293 Abnormal posture: Secondary | ICD-10-CM | POA: Diagnosis not present

## 2022-12-12 DIAGNOSIS — R531 Weakness: Secondary | ICD-10-CM | POA: Diagnosis not present

## 2022-12-12 DIAGNOSIS — G894 Chronic pain syndrome: Secondary | ICD-10-CM | POA: Diagnosis not present

## 2022-12-13 DIAGNOSIS — M6281 Muscle weakness (generalized): Secondary | ICD-10-CM | POA: Diagnosis not present

## 2022-12-13 DIAGNOSIS — R1312 Dysphagia, oropharyngeal phase: Secondary | ICD-10-CM | POA: Diagnosis not present

## 2022-12-13 DIAGNOSIS — G894 Chronic pain syndrome: Secondary | ICD-10-CM | POA: Diagnosis not present

## 2022-12-13 DIAGNOSIS — R531 Weakness: Secondary | ICD-10-CM | POA: Diagnosis not present

## 2022-12-13 DIAGNOSIS — R2689 Other abnormalities of gait and mobility: Secondary | ICD-10-CM | POA: Diagnosis not present

## 2022-12-13 DIAGNOSIS — R293 Abnormal posture: Secondary | ICD-10-CM | POA: Diagnosis not present

## 2022-12-14 DIAGNOSIS — R1312 Dysphagia, oropharyngeal phase: Secondary | ICD-10-CM | POA: Diagnosis not present

## 2022-12-14 DIAGNOSIS — R293 Abnormal posture: Secondary | ICD-10-CM | POA: Diagnosis not present

## 2022-12-14 DIAGNOSIS — R531 Weakness: Secondary | ICD-10-CM | POA: Diagnosis not present

## 2022-12-14 DIAGNOSIS — G894 Chronic pain syndrome: Secondary | ICD-10-CM | POA: Diagnosis not present

## 2022-12-14 DIAGNOSIS — R2689 Other abnormalities of gait and mobility: Secondary | ICD-10-CM | POA: Diagnosis not present

## 2022-12-14 DIAGNOSIS — M6281 Muscle weakness (generalized): Secondary | ICD-10-CM | POA: Diagnosis not present

## 2022-12-18 DIAGNOSIS — R2689 Other abnormalities of gait and mobility: Secondary | ICD-10-CM | POA: Diagnosis not present

## 2022-12-18 DIAGNOSIS — R531 Weakness: Secondary | ICD-10-CM | POA: Diagnosis not present

## 2022-12-18 DIAGNOSIS — M6281 Muscle weakness (generalized): Secondary | ICD-10-CM | POA: Diagnosis not present

## 2022-12-18 DIAGNOSIS — R1312 Dysphagia, oropharyngeal phase: Secondary | ICD-10-CM | POA: Diagnosis not present

## 2022-12-18 DIAGNOSIS — R293 Abnormal posture: Secondary | ICD-10-CM | POA: Diagnosis not present

## 2022-12-18 DIAGNOSIS — G894 Chronic pain syndrome: Secondary | ICD-10-CM | POA: Diagnosis not present

## 2022-12-19 DIAGNOSIS — R531 Weakness: Secondary | ICD-10-CM | POA: Diagnosis not present

## 2022-12-19 DIAGNOSIS — G894 Chronic pain syndrome: Secondary | ICD-10-CM | POA: Diagnosis not present

## 2022-12-19 DIAGNOSIS — R1312 Dysphagia, oropharyngeal phase: Secondary | ICD-10-CM | POA: Diagnosis not present

## 2022-12-19 DIAGNOSIS — R293 Abnormal posture: Secondary | ICD-10-CM | POA: Diagnosis not present

## 2022-12-19 DIAGNOSIS — R2689 Other abnormalities of gait and mobility: Secondary | ICD-10-CM | POA: Diagnosis not present

## 2022-12-19 DIAGNOSIS — M6281 Muscle weakness (generalized): Secondary | ICD-10-CM | POA: Diagnosis not present

## 2022-12-20 DIAGNOSIS — R293 Abnormal posture: Secondary | ICD-10-CM | POA: Diagnosis not present

## 2022-12-20 DIAGNOSIS — R2689 Other abnormalities of gait and mobility: Secondary | ICD-10-CM | POA: Diagnosis not present

## 2022-12-20 DIAGNOSIS — G894 Chronic pain syndrome: Secondary | ICD-10-CM | POA: Diagnosis not present

## 2022-12-20 DIAGNOSIS — R1312 Dysphagia, oropharyngeal phase: Secondary | ICD-10-CM | POA: Diagnosis not present

## 2022-12-20 DIAGNOSIS — M6281 Muscle weakness (generalized): Secondary | ICD-10-CM | POA: Diagnosis not present

## 2022-12-20 DIAGNOSIS — R531 Weakness: Secondary | ICD-10-CM | POA: Diagnosis not present

## 2022-12-21 DIAGNOSIS — R293 Abnormal posture: Secondary | ICD-10-CM | POA: Diagnosis not present

## 2022-12-21 DIAGNOSIS — R2689 Other abnormalities of gait and mobility: Secondary | ICD-10-CM | POA: Diagnosis not present

## 2022-12-21 DIAGNOSIS — G894 Chronic pain syndrome: Secondary | ICD-10-CM | POA: Diagnosis not present

## 2022-12-21 DIAGNOSIS — R1312 Dysphagia, oropharyngeal phase: Secondary | ICD-10-CM | POA: Diagnosis not present

## 2022-12-21 DIAGNOSIS — R531 Weakness: Secondary | ICD-10-CM | POA: Diagnosis not present

## 2022-12-21 DIAGNOSIS — M6281 Muscle weakness (generalized): Secondary | ICD-10-CM | POA: Diagnosis not present

## 2022-12-22 DIAGNOSIS — R293 Abnormal posture: Secondary | ICD-10-CM | POA: Diagnosis not present

## 2022-12-22 DIAGNOSIS — R1312 Dysphagia, oropharyngeal phase: Secondary | ICD-10-CM | POA: Diagnosis not present

## 2022-12-22 DIAGNOSIS — G894 Chronic pain syndrome: Secondary | ICD-10-CM | POA: Diagnosis not present

## 2022-12-22 DIAGNOSIS — M6281 Muscle weakness (generalized): Secondary | ICD-10-CM | POA: Diagnosis not present

## 2022-12-22 DIAGNOSIS — R2689 Other abnormalities of gait and mobility: Secondary | ICD-10-CM | POA: Diagnosis not present

## 2022-12-22 DIAGNOSIS — R531 Weakness: Secondary | ICD-10-CM | POA: Diagnosis not present

## 2022-12-25 DIAGNOSIS — G894 Chronic pain syndrome: Secondary | ICD-10-CM | POA: Diagnosis not present

## 2022-12-25 DIAGNOSIS — R293 Abnormal posture: Secondary | ICD-10-CM | POA: Diagnosis not present

## 2022-12-25 DIAGNOSIS — M6281 Muscle weakness (generalized): Secondary | ICD-10-CM | POA: Diagnosis not present

## 2022-12-25 DIAGNOSIS — R2689 Other abnormalities of gait and mobility: Secondary | ICD-10-CM | POA: Diagnosis not present

## 2022-12-25 DIAGNOSIS — R531 Weakness: Secondary | ICD-10-CM | POA: Diagnosis not present

## 2022-12-26 DIAGNOSIS — M6281 Muscle weakness (generalized): Secondary | ICD-10-CM | POA: Diagnosis not present

## 2022-12-26 DIAGNOSIS — R293 Abnormal posture: Secondary | ICD-10-CM | POA: Diagnosis not present

## 2022-12-26 DIAGNOSIS — R2689 Other abnormalities of gait and mobility: Secondary | ICD-10-CM | POA: Diagnosis not present

## 2022-12-26 DIAGNOSIS — G894 Chronic pain syndrome: Secondary | ICD-10-CM | POA: Diagnosis not present

## 2022-12-26 DIAGNOSIS — R531 Weakness: Secondary | ICD-10-CM | POA: Diagnosis not present

## 2022-12-27 DIAGNOSIS — R2689 Other abnormalities of gait and mobility: Secondary | ICD-10-CM | POA: Diagnosis not present

## 2022-12-27 DIAGNOSIS — G894 Chronic pain syndrome: Secondary | ICD-10-CM | POA: Diagnosis not present

## 2022-12-27 DIAGNOSIS — R293 Abnormal posture: Secondary | ICD-10-CM | POA: Diagnosis not present

## 2022-12-27 DIAGNOSIS — M6281 Muscle weakness (generalized): Secondary | ICD-10-CM | POA: Diagnosis not present

## 2022-12-27 DIAGNOSIS — R531 Weakness: Secondary | ICD-10-CM | POA: Diagnosis not present

## 2022-12-28 DIAGNOSIS — R531 Weakness: Secondary | ICD-10-CM | POA: Diagnosis not present

## 2022-12-28 DIAGNOSIS — R293 Abnormal posture: Secondary | ICD-10-CM | POA: Diagnosis not present

## 2022-12-28 DIAGNOSIS — M6281 Muscle weakness (generalized): Secondary | ICD-10-CM | POA: Diagnosis not present

## 2022-12-28 DIAGNOSIS — R2689 Other abnormalities of gait and mobility: Secondary | ICD-10-CM | POA: Diagnosis not present

## 2022-12-28 DIAGNOSIS — G894 Chronic pain syndrome: Secondary | ICD-10-CM | POA: Diagnosis not present

## 2022-12-31 DIAGNOSIS — R531 Weakness: Secondary | ICD-10-CM | POA: Diagnosis not present

## 2022-12-31 DIAGNOSIS — M6281 Muscle weakness (generalized): Secondary | ICD-10-CM | POA: Diagnosis not present

## 2022-12-31 DIAGNOSIS — R293 Abnormal posture: Secondary | ICD-10-CM | POA: Diagnosis not present

## 2022-12-31 DIAGNOSIS — G894 Chronic pain syndrome: Secondary | ICD-10-CM | POA: Diagnosis not present

## 2022-12-31 DIAGNOSIS — R2689 Other abnormalities of gait and mobility: Secondary | ICD-10-CM | POA: Diagnosis not present

## 2023-01-01 DIAGNOSIS — G894 Chronic pain syndrome: Secondary | ICD-10-CM | POA: Diagnosis not present

## 2023-01-01 DIAGNOSIS — R531 Weakness: Secondary | ICD-10-CM | POA: Diagnosis not present

## 2023-01-01 DIAGNOSIS — R2689 Other abnormalities of gait and mobility: Secondary | ICD-10-CM | POA: Diagnosis not present

## 2023-01-01 DIAGNOSIS — R293 Abnormal posture: Secondary | ICD-10-CM | POA: Diagnosis not present

## 2023-01-01 DIAGNOSIS — M6281 Muscle weakness (generalized): Secondary | ICD-10-CM | POA: Diagnosis not present

## 2023-01-02 DIAGNOSIS — R531 Weakness: Secondary | ICD-10-CM | POA: Diagnosis not present

## 2023-01-02 DIAGNOSIS — G894 Chronic pain syndrome: Secondary | ICD-10-CM | POA: Diagnosis not present

## 2023-01-02 DIAGNOSIS — M6281 Muscle weakness (generalized): Secondary | ICD-10-CM | POA: Diagnosis not present

## 2023-01-02 DIAGNOSIS — R293 Abnormal posture: Secondary | ICD-10-CM | POA: Diagnosis not present

## 2023-01-02 DIAGNOSIS — R2689 Other abnormalities of gait and mobility: Secondary | ICD-10-CM | POA: Diagnosis not present

## 2023-01-03 DIAGNOSIS — G894 Chronic pain syndrome: Secondary | ICD-10-CM | POA: Diagnosis not present

## 2023-01-03 DIAGNOSIS — R531 Weakness: Secondary | ICD-10-CM | POA: Diagnosis not present

## 2023-01-03 DIAGNOSIS — R2689 Other abnormalities of gait and mobility: Secondary | ICD-10-CM | POA: Diagnosis not present

## 2023-01-03 DIAGNOSIS — M6281 Muscle weakness (generalized): Secondary | ICD-10-CM | POA: Diagnosis not present

## 2023-01-03 DIAGNOSIS — R293 Abnormal posture: Secondary | ICD-10-CM | POA: Diagnosis not present

## 2023-01-04 DIAGNOSIS — R531 Weakness: Secondary | ICD-10-CM | POA: Diagnosis not present

## 2023-01-04 DIAGNOSIS — M6281 Muscle weakness (generalized): Secondary | ICD-10-CM | POA: Diagnosis not present

## 2023-01-04 DIAGNOSIS — G894 Chronic pain syndrome: Secondary | ICD-10-CM | POA: Diagnosis not present

## 2023-01-04 DIAGNOSIS — R293 Abnormal posture: Secondary | ICD-10-CM | POA: Diagnosis not present

## 2023-01-04 DIAGNOSIS — I10 Essential (primary) hypertension: Secondary | ICD-10-CM | POA: Diagnosis not present

## 2023-01-04 DIAGNOSIS — G959 Disease of spinal cord, unspecified: Secondary | ICD-10-CM | POA: Diagnosis not present

## 2023-01-04 DIAGNOSIS — R2689 Other abnormalities of gait and mobility: Secondary | ICD-10-CM | POA: Diagnosis not present

## 2023-01-07 DIAGNOSIS — R2689 Other abnormalities of gait and mobility: Secondary | ICD-10-CM | POA: Diagnosis not present

## 2023-01-07 DIAGNOSIS — R531 Weakness: Secondary | ICD-10-CM | POA: Diagnosis not present

## 2023-01-07 DIAGNOSIS — M6281 Muscle weakness (generalized): Secondary | ICD-10-CM | POA: Diagnosis not present

## 2023-01-07 DIAGNOSIS — R293 Abnormal posture: Secondary | ICD-10-CM | POA: Diagnosis not present

## 2023-01-07 DIAGNOSIS — G894 Chronic pain syndrome: Secondary | ICD-10-CM | POA: Diagnosis not present

## 2023-01-08 DIAGNOSIS — R2689 Other abnormalities of gait and mobility: Secondary | ICD-10-CM | POA: Diagnosis not present

## 2023-01-08 DIAGNOSIS — G894 Chronic pain syndrome: Secondary | ICD-10-CM | POA: Diagnosis not present

## 2023-01-08 DIAGNOSIS — R293 Abnormal posture: Secondary | ICD-10-CM | POA: Diagnosis not present

## 2023-01-08 DIAGNOSIS — M6281 Muscle weakness (generalized): Secondary | ICD-10-CM | POA: Diagnosis not present

## 2023-01-08 DIAGNOSIS — R531 Weakness: Secondary | ICD-10-CM | POA: Diagnosis not present

## 2023-01-09 DIAGNOSIS — M6281 Muscle weakness (generalized): Secondary | ICD-10-CM | POA: Diagnosis not present

## 2023-01-09 DIAGNOSIS — R293 Abnormal posture: Secondary | ICD-10-CM | POA: Diagnosis not present

## 2023-01-09 DIAGNOSIS — G894 Chronic pain syndrome: Secondary | ICD-10-CM | POA: Diagnosis not present

## 2023-01-09 DIAGNOSIS — R2689 Other abnormalities of gait and mobility: Secondary | ICD-10-CM | POA: Diagnosis not present

## 2023-01-09 DIAGNOSIS — R531 Weakness: Secondary | ICD-10-CM | POA: Diagnosis not present

## 2023-01-10 DIAGNOSIS — R2689 Other abnormalities of gait and mobility: Secondary | ICD-10-CM | POA: Diagnosis not present

## 2023-01-10 DIAGNOSIS — G894 Chronic pain syndrome: Secondary | ICD-10-CM | POA: Diagnosis not present

## 2023-01-10 DIAGNOSIS — M6281 Muscle weakness (generalized): Secondary | ICD-10-CM | POA: Diagnosis not present

## 2023-01-10 DIAGNOSIS — R531 Weakness: Secondary | ICD-10-CM | POA: Diagnosis not present

## 2023-01-10 DIAGNOSIS — R293 Abnormal posture: Secondary | ICD-10-CM | POA: Diagnosis not present

## 2023-01-11 DIAGNOSIS — M6281 Muscle weakness (generalized): Secondary | ICD-10-CM | POA: Diagnosis not present

## 2023-01-11 DIAGNOSIS — R531 Weakness: Secondary | ICD-10-CM | POA: Diagnosis not present

## 2023-01-11 DIAGNOSIS — G894 Chronic pain syndrome: Secondary | ICD-10-CM | POA: Diagnosis not present

## 2023-01-11 DIAGNOSIS — R2689 Other abnormalities of gait and mobility: Secondary | ICD-10-CM | POA: Diagnosis not present

## 2023-01-11 DIAGNOSIS — R293 Abnormal posture: Secondary | ICD-10-CM | POA: Diagnosis not present

## 2023-01-14 DIAGNOSIS — R531 Weakness: Secondary | ICD-10-CM | POA: Diagnosis not present

## 2023-01-14 DIAGNOSIS — R293 Abnormal posture: Secondary | ICD-10-CM | POA: Diagnosis not present

## 2023-01-14 DIAGNOSIS — M6281 Muscle weakness (generalized): Secondary | ICD-10-CM | POA: Diagnosis not present

## 2023-01-14 DIAGNOSIS — R2689 Other abnormalities of gait and mobility: Secondary | ICD-10-CM | POA: Diagnosis not present

## 2023-01-14 DIAGNOSIS — G894 Chronic pain syndrome: Secondary | ICD-10-CM | POA: Diagnosis not present

## 2023-01-15 DIAGNOSIS — M6281 Muscle weakness (generalized): Secondary | ICD-10-CM | POA: Diagnosis not present

## 2023-01-15 DIAGNOSIS — R531 Weakness: Secondary | ICD-10-CM | POA: Diagnosis not present

## 2023-01-15 DIAGNOSIS — R293 Abnormal posture: Secondary | ICD-10-CM | POA: Diagnosis not present

## 2023-01-15 DIAGNOSIS — R2689 Other abnormalities of gait and mobility: Secondary | ICD-10-CM | POA: Diagnosis not present

## 2023-01-15 DIAGNOSIS — G894 Chronic pain syndrome: Secondary | ICD-10-CM | POA: Diagnosis not present

## 2023-01-16 DIAGNOSIS — R2689 Other abnormalities of gait and mobility: Secondary | ICD-10-CM | POA: Diagnosis not present

## 2023-01-16 DIAGNOSIS — M6281 Muscle weakness (generalized): Secondary | ICD-10-CM | POA: Diagnosis not present

## 2023-01-16 DIAGNOSIS — G894 Chronic pain syndrome: Secondary | ICD-10-CM | POA: Diagnosis not present

## 2023-01-16 DIAGNOSIS — R293 Abnormal posture: Secondary | ICD-10-CM | POA: Diagnosis not present

## 2023-01-16 DIAGNOSIS — R531 Weakness: Secondary | ICD-10-CM | POA: Diagnosis not present

## 2023-01-17 DIAGNOSIS — R2689 Other abnormalities of gait and mobility: Secondary | ICD-10-CM | POA: Diagnosis not present

## 2023-01-17 DIAGNOSIS — R531 Weakness: Secondary | ICD-10-CM | POA: Diagnosis not present

## 2023-01-17 DIAGNOSIS — R293 Abnormal posture: Secondary | ICD-10-CM | POA: Diagnosis not present

## 2023-01-17 DIAGNOSIS — M6281 Muscle weakness (generalized): Secondary | ICD-10-CM | POA: Diagnosis not present

## 2023-01-17 DIAGNOSIS — G894 Chronic pain syndrome: Secondary | ICD-10-CM | POA: Diagnosis not present

## 2023-01-18 DIAGNOSIS — M6281 Muscle weakness (generalized): Secondary | ICD-10-CM | POA: Diagnosis not present

## 2023-01-18 DIAGNOSIS — G894 Chronic pain syndrome: Secondary | ICD-10-CM | POA: Diagnosis not present

## 2023-01-18 DIAGNOSIS — R2689 Other abnormalities of gait and mobility: Secondary | ICD-10-CM | POA: Diagnosis not present

## 2023-01-18 DIAGNOSIS — R293 Abnormal posture: Secondary | ICD-10-CM | POA: Diagnosis not present

## 2023-01-18 DIAGNOSIS — R531 Weakness: Secondary | ICD-10-CM | POA: Diagnosis not present

## 2023-01-22 DIAGNOSIS — M6281 Muscle weakness (generalized): Secondary | ICD-10-CM | POA: Diagnosis not present

## 2023-01-22 DIAGNOSIS — R531 Weakness: Secondary | ICD-10-CM | POA: Diagnosis not present

## 2023-01-22 DIAGNOSIS — R293 Abnormal posture: Secondary | ICD-10-CM | POA: Diagnosis not present

## 2023-01-22 DIAGNOSIS — R2689 Other abnormalities of gait and mobility: Secondary | ICD-10-CM | POA: Diagnosis not present

## 2023-01-22 DIAGNOSIS — G894 Chronic pain syndrome: Secondary | ICD-10-CM | POA: Diagnosis not present

## 2023-01-23 DIAGNOSIS — R531 Weakness: Secondary | ICD-10-CM | POA: Diagnosis not present

## 2023-01-23 DIAGNOSIS — G894 Chronic pain syndrome: Secondary | ICD-10-CM | POA: Diagnosis not present

## 2023-01-23 DIAGNOSIS — M6281 Muscle weakness (generalized): Secondary | ICD-10-CM | POA: Diagnosis not present

## 2023-01-23 DIAGNOSIS — R2689 Other abnormalities of gait and mobility: Secondary | ICD-10-CM | POA: Diagnosis not present

## 2023-01-23 DIAGNOSIS — R293 Abnormal posture: Secondary | ICD-10-CM | POA: Diagnosis not present

## 2023-01-24 DIAGNOSIS — G894 Chronic pain syndrome: Secondary | ICD-10-CM | POA: Diagnosis not present

## 2023-01-24 DIAGNOSIS — R531 Weakness: Secondary | ICD-10-CM | POA: Diagnosis not present

## 2023-01-24 DIAGNOSIS — R2689 Other abnormalities of gait and mobility: Secondary | ICD-10-CM | POA: Diagnosis not present

## 2023-01-24 DIAGNOSIS — R293 Abnormal posture: Secondary | ICD-10-CM | POA: Diagnosis not present

## 2023-01-24 DIAGNOSIS — M6281 Muscle weakness (generalized): Secondary | ICD-10-CM | POA: Diagnosis not present

## 2023-01-25 DIAGNOSIS — G894 Chronic pain syndrome: Secondary | ICD-10-CM | POA: Diagnosis not present

## 2023-01-25 DIAGNOSIS — R531 Weakness: Secondary | ICD-10-CM | POA: Diagnosis not present

## 2023-01-25 DIAGNOSIS — R2689 Other abnormalities of gait and mobility: Secondary | ICD-10-CM | POA: Diagnosis not present

## 2023-01-25 DIAGNOSIS — M6281 Muscle weakness (generalized): Secondary | ICD-10-CM | POA: Diagnosis not present

## 2023-01-25 DIAGNOSIS — R293 Abnormal posture: Secondary | ICD-10-CM | POA: Diagnosis not present

## 2023-01-28 DIAGNOSIS — R293 Abnormal posture: Secondary | ICD-10-CM | POA: Diagnosis not present

## 2023-01-28 DIAGNOSIS — G894 Chronic pain syndrome: Secondary | ICD-10-CM | POA: Diagnosis not present

## 2023-01-28 DIAGNOSIS — R531 Weakness: Secondary | ICD-10-CM | POA: Diagnosis not present

## 2023-01-28 DIAGNOSIS — M6281 Muscle weakness (generalized): Secondary | ICD-10-CM | POA: Diagnosis not present

## 2023-01-28 DIAGNOSIS — R2689 Other abnormalities of gait and mobility: Secondary | ICD-10-CM | POA: Diagnosis not present

## 2023-01-29 DIAGNOSIS — R293 Abnormal posture: Secondary | ICD-10-CM | POA: Diagnosis not present

## 2023-01-29 DIAGNOSIS — R531 Weakness: Secondary | ICD-10-CM | POA: Diagnosis not present

## 2023-01-29 DIAGNOSIS — G894 Chronic pain syndrome: Secondary | ICD-10-CM | POA: Diagnosis not present

## 2023-01-29 DIAGNOSIS — R2689 Other abnormalities of gait and mobility: Secondary | ICD-10-CM | POA: Diagnosis not present

## 2023-01-29 DIAGNOSIS — M6281 Muscle weakness (generalized): Secondary | ICD-10-CM | POA: Diagnosis not present

## 2023-01-30 DIAGNOSIS — G894 Chronic pain syndrome: Secondary | ICD-10-CM | POA: Diagnosis not present

## 2023-01-30 DIAGNOSIS — R293 Abnormal posture: Secondary | ICD-10-CM | POA: Diagnosis not present

## 2023-01-30 DIAGNOSIS — R531 Weakness: Secondary | ICD-10-CM | POA: Diagnosis not present

## 2023-01-30 DIAGNOSIS — M6281 Muscle weakness (generalized): Secondary | ICD-10-CM | POA: Diagnosis not present

## 2023-01-30 DIAGNOSIS — R2689 Other abnormalities of gait and mobility: Secondary | ICD-10-CM | POA: Diagnosis not present

## 2023-01-31 DIAGNOSIS — M6281 Muscle weakness (generalized): Secondary | ICD-10-CM | POA: Diagnosis not present

## 2023-01-31 DIAGNOSIS — R293 Abnormal posture: Secondary | ICD-10-CM | POA: Diagnosis not present

## 2023-01-31 DIAGNOSIS — G894 Chronic pain syndrome: Secondary | ICD-10-CM | POA: Diagnosis not present

## 2023-01-31 DIAGNOSIS — R2689 Other abnormalities of gait and mobility: Secondary | ICD-10-CM | POA: Diagnosis not present

## 2023-01-31 DIAGNOSIS — R531 Weakness: Secondary | ICD-10-CM | POA: Diagnosis not present

## 2023-02-01 DIAGNOSIS — M6281 Muscle weakness (generalized): Secondary | ICD-10-CM | POA: Diagnosis not present

## 2023-02-01 DIAGNOSIS — R2689 Other abnormalities of gait and mobility: Secondary | ICD-10-CM | POA: Diagnosis not present

## 2023-02-01 DIAGNOSIS — G894 Chronic pain syndrome: Secondary | ICD-10-CM | POA: Diagnosis not present

## 2023-02-01 DIAGNOSIS — R293 Abnormal posture: Secondary | ICD-10-CM | POA: Diagnosis not present

## 2023-02-01 DIAGNOSIS — R531 Weakness: Secondary | ICD-10-CM | POA: Diagnosis not present

## 2023-02-02 DIAGNOSIS — G894 Chronic pain syndrome: Secondary | ICD-10-CM | POA: Diagnosis not present

## 2023-02-02 DIAGNOSIS — R293 Abnormal posture: Secondary | ICD-10-CM | POA: Diagnosis not present

## 2023-02-02 DIAGNOSIS — R531 Weakness: Secondary | ICD-10-CM | POA: Diagnosis not present

## 2023-02-02 DIAGNOSIS — M6281 Muscle weakness (generalized): Secondary | ICD-10-CM | POA: Diagnosis not present

## 2023-02-02 DIAGNOSIS — R2689 Other abnormalities of gait and mobility: Secondary | ICD-10-CM | POA: Diagnosis not present

## 2023-02-04 DIAGNOSIS — M2042 Other hammer toe(s) (acquired), left foot: Secondary | ICD-10-CM | POA: Diagnosis not present

## 2023-02-04 DIAGNOSIS — I739 Peripheral vascular disease, unspecified: Secondary | ICD-10-CM | POA: Diagnosis not present

## 2023-02-04 DIAGNOSIS — B351 Tinea unguium: Secondary | ICD-10-CM | POA: Diagnosis not present

## 2023-02-04 DIAGNOSIS — M2041 Other hammer toe(s) (acquired), right foot: Secondary | ICD-10-CM | POA: Diagnosis not present

## 2023-02-05 DIAGNOSIS — R531 Weakness: Secondary | ICD-10-CM | POA: Diagnosis not present

## 2023-02-05 DIAGNOSIS — R293 Abnormal posture: Secondary | ICD-10-CM | POA: Diagnosis not present

## 2023-02-05 DIAGNOSIS — M6281 Muscle weakness (generalized): Secondary | ICD-10-CM | POA: Diagnosis not present

## 2023-02-05 DIAGNOSIS — R2689 Other abnormalities of gait and mobility: Secondary | ICD-10-CM | POA: Diagnosis not present

## 2023-02-05 DIAGNOSIS — G894 Chronic pain syndrome: Secondary | ICD-10-CM | POA: Diagnosis not present

## 2023-02-06 DIAGNOSIS — R293 Abnormal posture: Secondary | ICD-10-CM | POA: Diagnosis not present

## 2023-02-06 DIAGNOSIS — R531 Weakness: Secondary | ICD-10-CM | POA: Diagnosis not present

## 2023-02-06 DIAGNOSIS — M6281 Muscle weakness (generalized): Secondary | ICD-10-CM | POA: Diagnosis not present

## 2023-02-06 DIAGNOSIS — R2689 Other abnormalities of gait and mobility: Secondary | ICD-10-CM | POA: Diagnosis not present

## 2023-02-06 DIAGNOSIS — G894 Chronic pain syndrome: Secondary | ICD-10-CM | POA: Diagnosis not present

## 2023-02-07 DIAGNOSIS — R293 Abnormal posture: Secondary | ICD-10-CM | POA: Diagnosis not present

## 2023-02-07 DIAGNOSIS — M6281 Muscle weakness (generalized): Secondary | ICD-10-CM | POA: Diagnosis not present

## 2023-02-07 DIAGNOSIS — G894 Chronic pain syndrome: Secondary | ICD-10-CM | POA: Diagnosis not present

## 2023-02-07 DIAGNOSIS — R531 Weakness: Secondary | ICD-10-CM | POA: Diagnosis not present

## 2023-02-07 DIAGNOSIS — R2689 Other abnormalities of gait and mobility: Secondary | ICD-10-CM | POA: Diagnosis not present

## 2023-02-08 DIAGNOSIS — R2689 Other abnormalities of gait and mobility: Secondary | ICD-10-CM | POA: Diagnosis not present

## 2023-02-08 DIAGNOSIS — R293 Abnormal posture: Secondary | ICD-10-CM | POA: Diagnosis not present

## 2023-02-08 DIAGNOSIS — R531 Weakness: Secondary | ICD-10-CM | POA: Diagnosis not present

## 2023-02-08 DIAGNOSIS — G894 Chronic pain syndrome: Secondary | ICD-10-CM | POA: Diagnosis not present

## 2023-02-08 DIAGNOSIS — M6281 Muscle weakness (generalized): Secondary | ICD-10-CM | POA: Diagnosis not present

## 2023-02-11 DIAGNOSIS — R293 Abnormal posture: Secondary | ICD-10-CM | POA: Diagnosis not present

## 2023-02-11 DIAGNOSIS — R531 Weakness: Secondary | ICD-10-CM | POA: Diagnosis not present

## 2023-02-11 DIAGNOSIS — G894 Chronic pain syndrome: Secondary | ICD-10-CM | POA: Diagnosis not present

## 2023-02-11 DIAGNOSIS — R2689 Other abnormalities of gait and mobility: Secondary | ICD-10-CM | POA: Diagnosis not present

## 2023-02-11 DIAGNOSIS — M6281 Muscle weakness (generalized): Secondary | ICD-10-CM | POA: Diagnosis not present

## 2023-02-12 DIAGNOSIS — M6281 Muscle weakness (generalized): Secondary | ICD-10-CM | POA: Diagnosis not present

## 2023-02-12 DIAGNOSIS — G894 Chronic pain syndrome: Secondary | ICD-10-CM | POA: Diagnosis not present

## 2023-02-12 DIAGNOSIS — R531 Weakness: Secondary | ICD-10-CM | POA: Diagnosis not present

## 2023-02-12 DIAGNOSIS — R2689 Other abnormalities of gait and mobility: Secondary | ICD-10-CM | POA: Diagnosis not present

## 2023-02-12 DIAGNOSIS — R293 Abnormal posture: Secondary | ICD-10-CM | POA: Diagnosis not present

## 2023-03-02 DIAGNOSIS — I10 Essential (primary) hypertension: Secondary | ICD-10-CM | POA: Diagnosis not present

## 2023-03-02 DIAGNOSIS — G894 Chronic pain syndrome: Secondary | ICD-10-CM | POA: Diagnosis not present

## 2023-03-02 DIAGNOSIS — G959 Disease of spinal cord, unspecified: Secondary | ICD-10-CM | POA: Diagnosis not present

## 2023-03-11 DIAGNOSIS — Z48811 Encounter for surgical aftercare following surgery on the nervous system: Secondary | ICD-10-CM | POA: Diagnosis not present

## 2023-03-11 DIAGNOSIS — R1310 Dysphagia, unspecified: Secondary | ICD-10-CM | POA: Diagnosis not present

## 2023-03-12 DIAGNOSIS — R1310 Dysphagia, unspecified: Secondary | ICD-10-CM | POA: Diagnosis not present

## 2023-03-12 DIAGNOSIS — Z48811 Encounter for surgical aftercare following surgery on the nervous system: Secondary | ICD-10-CM | POA: Diagnosis not present

## 2023-03-13 DIAGNOSIS — Z48811 Encounter for surgical aftercare following surgery on the nervous system: Secondary | ICD-10-CM | POA: Diagnosis not present

## 2023-03-13 DIAGNOSIS — R1310 Dysphagia, unspecified: Secondary | ICD-10-CM | POA: Diagnosis not present

## 2023-03-14 DIAGNOSIS — R1310 Dysphagia, unspecified: Secondary | ICD-10-CM | POA: Diagnosis not present

## 2023-03-14 DIAGNOSIS — Z48811 Encounter for surgical aftercare following surgery on the nervous system: Secondary | ICD-10-CM | POA: Diagnosis not present

## 2023-03-22 DIAGNOSIS — Z48811 Encounter for surgical aftercare following surgery on the nervous system: Secondary | ICD-10-CM | POA: Diagnosis not present

## 2023-03-22 DIAGNOSIS — R1310 Dysphagia, unspecified: Secondary | ICD-10-CM | POA: Diagnosis not present

## 2023-03-26 DIAGNOSIS — R1310 Dysphagia, unspecified: Secondary | ICD-10-CM | POA: Diagnosis not present

## 2023-03-26 DIAGNOSIS — M62562 Muscle wasting and atrophy, not elsewhere classified, left lower leg: Secondary | ICD-10-CM | POA: Diagnosis not present

## 2023-03-26 DIAGNOSIS — R293 Abnormal posture: Secondary | ICD-10-CM | POA: Diagnosis not present

## 2023-03-26 DIAGNOSIS — Z48811 Encounter for surgical aftercare following surgery on the nervous system: Secondary | ICD-10-CM | POA: Diagnosis not present

## 2023-03-27 DIAGNOSIS — M62562 Muscle wasting and atrophy, not elsewhere classified, left lower leg: Secondary | ICD-10-CM | POA: Diagnosis not present

## 2023-03-27 DIAGNOSIS — Z48811 Encounter for surgical aftercare following surgery on the nervous system: Secondary | ICD-10-CM | POA: Diagnosis not present

## 2023-03-27 DIAGNOSIS — R1310 Dysphagia, unspecified: Secondary | ICD-10-CM | POA: Diagnosis not present

## 2023-03-27 DIAGNOSIS — R293 Abnormal posture: Secondary | ICD-10-CM | POA: Diagnosis not present

## 2023-03-28 DIAGNOSIS — R293 Abnormal posture: Secondary | ICD-10-CM | POA: Diagnosis not present

## 2023-03-28 DIAGNOSIS — M62562 Muscle wasting and atrophy, not elsewhere classified, left lower leg: Secondary | ICD-10-CM | POA: Diagnosis not present

## 2023-03-28 DIAGNOSIS — R1310 Dysphagia, unspecified: Secondary | ICD-10-CM | POA: Diagnosis not present

## 2023-03-28 DIAGNOSIS — Z48811 Encounter for surgical aftercare following surgery on the nervous system: Secondary | ICD-10-CM | POA: Diagnosis not present

## 2023-03-29 DIAGNOSIS — Z48811 Encounter for surgical aftercare following surgery on the nervous system: Secondary | ICD-10-CM | POA: Diagnosis not present

## 2023-03-29 DIAGNOSIS — R293 Abnormal posture: Secondary | ICD-10-CM | POA: Diagnosis not present

## 2023-03-29 DIAGNOSIS — M62562 Muscle wasting and atrophy, not elsewhere classified, left lower leg: Secondary | ICD-10-CM | POA: Diagnosis not present

## 2023-03-29 DIAGNOSIS — R1310 Dysphagia, unspecified: Secondary | ICD-10-CM | POA: Diagnosis not present

## 2023-04-02 DIAGNOSIS — M62562 Muscle wasting and atrophy, not elsewhere classified, left lower leg: Secondary | ICD-10-CM | POA: Diagnosis not present

## 2023-04-02 DIAGNOSIS — Z48811 Encounter for surgical aftercare following surgery on the nervous system: Secondary | ICD-10-CM | POA: Diagnosis not present

## 2023-04-02 DIAGNOSIS — R1310 Dysphagia, unspecified: Secondary | ICD-10-CM | POA: Diagnosis not present

## 2023-04-02 DIAGNOSIS — R293 Abnormal posture: Secondary | ICD-10-CM | POA: Diagnosis not present

## 2023-04-03 DIAGNOSIS — R1310 Dysphagia, unspecified: Secondary | ICD-10-CM | POA: Diagnosis not present

## 2023-04-03 DIAGNOSIS — M62562 Muscle wasting and atrophy, not elsewhere classified, left lower leg: Secondary | ICD-10-CM | POA: Diagnosis not present

## 2023-04-03 DIAGNOSIS — Z48811 Encounter for surgical aftercare following surgery on the nervous system: Secondary | ICD-10-CM | POA: Diagnosis not present

## 2023-04-03 DIAGNOSIS — R293 Abnormal posture: Secondary | ICD-10-CM | POA: Diagnosis not present

## 2023-04-04 DIAGNOSIS — R1310 Dysphagia, unspecified: Secondary | ICD-10-CM | POA: Diagnosis not present

## 2023-04-04 DIAGNOSIS — M62562 Muscle wasting and atrophy, not elsewhere classified, left lower leg: Secondary | ICD-10-CM | POA: Diagnosis not present

## 2023-04-04 DIAGNOSIS — R293 Abnormal posture: Secondary | ICD-10-CM | POA: Diagnosis not present

## 2023-04-04 DIAGNOSIS — Z48811 Encounter for surgical aftercare following surgery on the nervous system: Secondary | ICD-10-CM | POA: Diagnosis not present

## 2023-04-08 DIAGNOSIS — Z48811 Encounter for surgical aftercare following surgery on the nervous system: Secondary | ICD-10-CM | POA: Diagnosis not present

## 2023-04-08 DIAGNOSIS — M62562 Muscle wasting and atrophy, not elsewhere classified, left lower leg: Secondary | ICD-10-CM | POA: Diagnosis not present

## 2023-04-08 DIAGNOSIS — R293 Abnormal posture: Secondary | ICD-10-CM | POA: Diagnosis not present

## 2023-04-08 DIAGNOSIS — R1310 Dysphagia, unspecified: Secondary | ICD-10-CM | POA: Diagnosis not present

## 2023-04-09 DIAGNOSIS — Z48811 Encounter for surgical aftercare following surgery on the nervous system: Secondary | ICD-10-CM | POA: Diagnosis not present

## 2023-04-09 DIAGNOSIS — R1310 Dysphagia, unspecified: Secondary | ICD-10-CM | POA: Diagnosis not present

## 2023-04-09 DIAGNOSIS — R293 Abnormal posture: Secondary | ICD-10-CM | POA: Diagnosis not present

## 2023-04-09 DIAGNOSIS — M62562 Muscle wasting and atrophy, not elsewhere classified, left lower leg: Secondary | ICD-10-CM | POA: Diagnosis not present

## 2023-04-10 DIAGNOSIS — Z48811 Encounter for surgical aftercare following surgery on the nervous system: Secondary | ICD-10-CM | POA: Diagnosis not present

## 2023-04-10 DIAGNOSIS — M62562 Muscle wasting and atrophy, not elsewhere classified, left lower leg: Secondary | ICD-10-CM | POA: Diagnosis not present

## 2023-04-10 DIAGNOSIS — R1310 Dysphagia, unspecified: Secondary | ICD-10-CM | POA: Diagnosis not present

## 2023-04-10 DIAGNOSIS — R293 Abnormal posture: Secondary | ICD-10-CM | POA: Diagnosis not present

## 2023-04-11 DIAGNOSIS — M62562 Muscle wasting and atrophy, not elsewhere classified, left lower leg: Secondary | ICD-10-CM | POA: Diagnosis not present

## 2023-04-11 DIAGNOSIS — R1310 Dysphagia, unspecified: Secondary | ICD-10-CM | POA: Diagnosis not present

## 2023-04-11 DIAGNOSIS — R293 Abnormal posture: Secondary | ICD-10-CM | POA: Diagnosis not present

## 2023-04-11 DIAGNOSIS — Z48811 Encounter for surgical aftercare following surgery on the nervous system: Secondary | ICD-10-CM | POA: Diagnosis not present

## 2023-04-12 DIAGNOSIS — M62562 Muscle wasting and atrophy, not elsewhere classified, left lower leg: Secondary | ICD-10-CM | POA: Diagnosis not present

## 2023-04-12 DIAGNOSIS — R293 Abnormal posture: Secondary | ICD-10-CM | POA: Diagnosis not present

## 2023-04-12 DIAGNOSIS — Z48811 Encounter for surgical aftercare following surgery on the nervous system: Secondary | ICD-10-CM | POA: Diagnosis not present

## 2023-04-12 DIAGNOSIS — R1310 Dysphagia, unspecified: Secondary | ICD-10-CM | POA: Diagnosis not present

## 2023-04-13 DIAGNOSIS — Z48811 Encounter for surgical aftercare following surgery on the nervous system: Secondary | ICD-10-CM | POA: Diagnosis not present

## 2023-04-13 DIAGNOSIS — R1310 Dysphagia, unspecified: Secondary | ICD-10-CM | POA: Diagnosis not present

## 2023-04-13 DIAGNOSIS — R293 Abnormal posture: Secondary | ICD-10-CM | POA: Diagnosis not present

## 2023-04-13 DIAGNOSIS — M62562 Muscle wasting and atrophy, not elsewhere classified, left lower leg: Secondary | ICD-10-CM | POA: Diagnosis not present

## 2023-04-14 DIAGNOSIS — R293 Abnormal posture: Secondary | ICD-10-CM | POA: Diagnosis not present

## 2023-04-14 DIAGNOSIS — Z48811 Encounter for surgical aftercare following surgery on the nervous system: Secondary | ICD-10-CM | POA: Diagnosis not present

## 2023-04-14 DIAGNOSIS — M62562 Muscle wasting and atrophy, not elsewhere classified, left lower leg: Secondary | ICD-10-CM | POA: Diagnosis not present

## 2023-04-14 DIAGNOSIS — R1310 Dysphagia, unspecified: Secondary | ICD-10-CM | POA: Diagnosis not present

## 2023-04-16 DIAGNOSIS — R1310 Dysphagia, unspecified: Secondary | ICD-10-CM | POA: Diagnosis not present

## 2023-04-16 DIAGNOSIS — M62562 Muscle wasting and atrophy, not elsewhere classified, left lower leg: Secondary | ICD-10-CM | POA: Diagnosis not present

## 2023-04-16 DIAGNOSIS — R293 Abnormal posture: Secondary | ICD-10-CM | POA: Diagnosis not present

## 2023-04-16 DIAGNOSIS — Z48811 Encounter for surgical aftercare following surgery on the nervous system: Secondary | ICD-10-CM | POA: Diagnosis not present

## 2023-04-17 DIAGNOSIS — R1310 Dysphagia, unspecified: Secondary | ICD-10-CM | POA: Diagnosis not present

## 2023-04-17 DIAGNOSIS — Z48811 Encounter for surgical aftercare following surgery on the nervous system: Secondary | ICD-10-CM | POA: Diagnosis not present

## 2023-04-17 DIAGNOSIS — R293 Abnormal posture: Secondary | ICD-10-CM | POA: Diagnosis not present

## 2023-04-17 DIAGNOSIS — M62562 Muscle wasting and atrophy, not elsewhere classified, left lower leg: Secondary | ICD-10-CM | POA: Diagnosis not present

## 2023-04-18 DIAGNOSIS — R1310 Dysphagia, unspecified: Secondary | ICD-10-CM | POA: Diagnosis not present

## 2023-04-18 DIAGNOSIS — R293 Abnormal posture: Secondary | ICD-10-CM | POA: Diagnosis not present

## 2023-04-18 DIAGNOSIS — M62562 Muscle wasting and atrophy, not elsewhere classified, left lower leg: Secondary | ICD-10-CM | POA: Diagnosis not present

## 2023-04-18 DIAGNOSIS — Z48811 Encounter for surgical aftercare following surgery on the nervous system: Secondary | ICD-10-CM | POA: Diagnosis not present

## 2023-04-19 DIAGNOSIS — Z48811 Encounter for surgical aftercare following surgery on the nervous system: Secondary | ICD-10-CM | POA: Diagnosis not present

## 2023-04-19 DIAGNOSIS — R293 Abnormal posture: Secondary | ICD-10-CM | POA: Diagnosis not present

## 2023-04-19 DIAGNOSIS — M62562 Muscle wasting and atrophy, not elsewhere classified, left lower leg: Secondary | ICD-10-CM | POA: Diagnosis not present

## 2023-04-19 DIAGNOSIS — R1310 Dysphagia, unspecified: Secondary | ICD-10-CM | POA: Diagnosis not present

## 2023-04-20 DIAGNOSIS — Z48811 Encounter for surgical aftercare following surgery on the nervous system: Secondary | ICD-10-CM | POA: Diagnosis not present

## 2023-04-20 DIAGNOSIS — R1310 Dysphagia, unspecified: Secondary | ICD-10-CM | POA: Diagnosis not present

## 2023-04-20 DIAGNOSIS — R293 Abnormal posture: Secondary | ICD-10-CM | POA: Diagnosis not present

## 2023-04-20 DIAGNOSIS — M62562 Muscle wasting and atrophy, not elsewhere classified, left lower leg: Secondary | ICD-10-CM | POA: Diagnosis not present

## 2023-04-22 DIAGNOSIS — I10 Essential (primary) hypertension: Secondary | ICD-10-CM | POA: Diagnosis not present

## 2023-04-22 DIAGNOSIS — M62562 Muscle wasting and atrophy, not elsewhere classified, left lower leg: Secondary | ICD-10-CM | POA: Diagnosis not present

## 2023-04-22 DIAGNOSIS — R002 Palpitations: Secondary | ICD-10-CM | POA: Diagnosis not present

## 2023-04-22 DIAGNOSIS — E782 Mixed hyperlipidemia: Secondary | ICD-10-CM | POA: Diagnosis not present

## 2023-04-22 DIAGNOSIS — R1310 Dysphagia, unspecified: Secondary | ICD-10-CM | POA: Diagnosis not present

## 2023-04-22 DIAGNOSIS — Z48811 Encounter for surgical aftercare following surgery on the nervous system: Secondary | ICD-10-CM | POA: Diagnosis not present

## 2023-04-22 DIAGNOSIS — R293 Abnormal posture: Secondary | ICD-10-CM | POA: Diagnosis not present

## 2023-04-23 DIAGNOSIS — M62562 Muscle wasting and atrophy, not elsewhere classified, left lower leg: Secondary | ICD-10-CM | POA: Diagnosis not present

## 2023-04-23 DIAGNOSIS — Z48811 Encounter for surgical aftercare following surgery on the nervous system: Secondary | ICD-10-CM | POA: Diagnosis not present

## 2023-04-23 DIAGNOSIS — R1310 Dysphagia, unspecified: Secondary | ICD-10-CM | POA: Diagnosis not present

## 2023-04-23 DIAGNOSIS — R293 Abnormal posture: Secondary | ICD-10-CM | POA: Diagnosis not present

## 2023-04-24 DIAGNOSIS — M62562 Muscle wasting and atrophy, not elsewhere classified, left lower leg: Secondary | ICD-10-CM | POA: Diagnosis not present

## 2023-04-24 DIAGNOSIS — R293 Abnormal posture: Secondary | ICD-10-CM | POA: Diagnosis not present

## 2023-04-24 DIAGNOSIS — R1310 Dysphagia, unspecified: Secondary | ICD-10-CM | POA: Diagnosis not present

## 2023-04-24 DIAGNOSIS — Z48811 Encounter for surgical aftercare following surgery on the nervous system: Secondary | ICD-10-CM | POA: Diagnosis not present

## 2023-04-25 DIAGNOSIS — R1310 Dysphagia, unspecified: Secondary | ICD-10-CM | POA: Diagnosis not present

## 2023-04-25 DIAGNOSIS — R293 Abnormal posture: Secondary | ICD-10-CM | POA: Diagnosis not present

## 2023-04-25 DIAGNOSIS — M62562 Muscle wasting and atrophy, not elsewhere classified, left lower leg: Secondary | ICD-10-CM | POA: Diagnosis not present

## 2023-04-25 DIAGNOSIS — Z48811 Encounter for surgical aftercare following surgery on the nervous system: Secondary | ICD-10-CM | POA: Diagnosis not present

## 2023-04-26 DIAGNOSIS — M62562 Muscle wasting and atrophy, not elsewhere classified, left lower leg: Secondary | ICD-10-CM | POA: Diagnosis not present

## 2023-04-26 DIAGNOSIS — Z48811 Encounter for surgical aftercare following surgery on the nervous system: Secondary | ICD-10-CM | POA: Diagnosis not present

## 2023-04-26 DIAGNOSIS — R293 Abnormal posture: Secondary | ICD-10-CM | POA: Diagnosis not present

## 2023-04-26 DIAGNOSIS — R1310 Dysphagia, unspecified: Secondary | ICD-10-CM | POA: Diagnosis not present

## 2023-04-29 DIAGNOSIS — Z48811 Encounter for surgical aftercare following surgery on the nervous system: Secondary | ICD-10-CM | POA: Diagnosis not present

## 2023-04-29 DIAGNOSIS — R1310 Dysphagia, unspecified: Secondary | ICD-10-CM | POA: Diagnosis not present

## 2023-04-29 DIAGNOSIS — R293 Abnormal posture: Secondary | ICD-10-CM | POA: Diagnosis not present

## 2023-04-29 DIAGNOSIS — M62562 Muscle wasting and atrophy, not elsewhere classified, left lower leg: Secondary | ICD-10-CM | POA: Diagnosis not present

## 2023-04-30 DIAGNOSIS — R1310 Dysphagia, unspecified: Secondary | ICD-10-CM | POA: Diagnosis not present

## 2023-04-30 DIAGNOSIS — Z48811 Encounter for surgical aftercare following surgery on the nervous system: Secondary | ICD-10-CM | POA: Diagnosis not present

## 2023-04-30 DIAGNOSIS — R293 Abnormal posture: Secondary | ICD-10-CM | POA: Diagnosis not present

## 2023-04-30 DIAGNOSIS — M62562 Muscle wasting and atrophy, not elsewhere classified, left lower leg: Secondary | ICD-10-CM | POA: Diagnosis not present

## 2023-05-01 DIAGNOSIS — R1310 Dysphagia, unspecified: Secondary | ICD-10-CM | POA: Diagnosis not present

## 2023-05-01 DIAGNOSIS — R293 Abnormal posture: Secondary | ICD-10-CM | POA: Diagnosis not present

## 2023-05-01 DIAGNOSIS — Z48811 Encounter for surgical aftercare following surgery on the nervous system: Secondary | ICD-10-CM | POA: Diagnosis not present

## 2023-05-01 DIAGNOSIS — M62562 Muscle wasting and atrophy, not elsewhere classified, left lower leg: Secondary | ICD-10-CM | POA: Diagnosis not present

## 2023-05-02 DIAGNOSIS — Z48811 Encounter for surgical aftercare following surgery on the nervous system: Secondary | ICD-10-CM | POA: Diagnosis not present

## 2023-05-02 DIAGNOSIS — M62562 Muscle wasting and atrophy, not elsewhere classified, left lower leg: Secondary | ICD-10-CM | POA: Diagnosis not present

## 2023-05-02 DIAGNOSIS — R293 Abnormal posture: Secondary | ICD-10-CM | POA: Diagnosis not present

## 2023-05-02 DIAGNOSIS — R1310 Dysphagia, unspecified: Secondary | ICD-10-CM | POA: Diagnosis not present

## 2023-05-03 DIAGNOSIS — I10 Essential (primary) hypertension: Secondary | ICD-10-CM | POA: Diagnosis not present

## 2023-05-03 DIAGNOSIS — G959 Disease of spinal cord, unspecified: Secondary | ICD-10-CM | POA: Diagnosis not present

## 2023-05-03 DIAGNOSIS — G894 Chronic pain syndrome: Secondary | ICD-10-CM | POA: Diagnosis not present

## 2023-05-06 DIAGNOSIS — Z961 Presence of intraocular lens: Secondary | ICD-10-CM | POA: Diagnosis not present

## 2023-05-06 DIAGNOSIS — H524 Presbyopia: Secondary | ICD-10-CM | POA: Diagnosis not present

## 2023-05-08 DIAGNOSIS — L603 Nail dystrophy: Secondary | ICD-10-CM | POA: Diagnosis not present

## 2023-05-08 DIAGNOSIS — I739 Peripheral vascular disease, unspecified: Secondary | ICD-10-CM | POA: Diagnosis not present

## 2023-05-09 DIAGNOSIS — R293 Abnormal posture: Secondary | ICD-10-CM | POA: Diagnosis not present

## 2023-05-09 DIAGNOSIS — R1310 Dysphagia, unspecified: Secondary | ICD-10-CM | POA: Diagnosis not present

## 2023-05-09 DIAGNOSIS — Z48811 Encounter for surgical aftercare following surgery on the nervous system: Secondary | ICD-10-CM | POA: Diagnosis not present

## 2023-05-09 DIAGNOSIS — M62562 Muscle wasting and atrophy, not elsewhere classified, left lower leg: Secondary | ICD-10-CM | POA: Diagnosis not present

## 2023-05-10 DIAGNOSIS — R1310 Dysphagia, unspecified: Secondary | ICD-10-CM | POA: Diagnosis not present

## 2023-05-10 DIAGNOSIS — Z48811 Encounter for surgical aftercare following surgery on the nervous system: Secondary | ICD-10-CM | POA: Diagnosis not present

## 2023-05-10 DIAGNOSIS — M62562 Muscle wasting and atrophy, not elsewhere classified, left lower leg: Secondary | ICD-10-CM | POA: Diagnosis not present

## 2023-05-10 DIAGNOSIS — R293 Abnormal posture: Secondary | ICD-10-CM | POA: Diagnosis not present

## 2023-05-13 DIAGNOSIS — R002 Palpitations: Secondary | ICD-10-CM | POA: Diagnosis not present

## 2023-05-13 DIAGNOSIS — I10 Essential (primary) hypertension: Secondary | ICD-10-CM | POA: Diagnosis not present

## 2023-05-13 DIAGNOSIS — I080 Rheumatic disorders of both mitral and aortic valves: Secondary | ICD-10-CM | POA: Diagnosis not present

## 2023-05-14 DIAGNOSIS — R1310 Dysphagia, unspecified: Secondary | ICD-10-CM | POA: Diagnosis not present

## 2023-05-14 DIAGNOSIS — Z48811 Encounter for surgical aftercare following surgery on the nervous system: Secondary | ICD-10-CM | POA: Diagnosis not present

## 2023-05-14 DIAGNOSIS — R293 Abnormal posture: Secondary | ICD-10-CM | POA: Diagnosis not present

## 2023-05-14 DIAGNOSIS — M62562 Muscle wasting and atrophy, not elsewhere classified, left lower leg: Secondary | ICD-10-CM | POA: Diagnosis not present

## 2023-05-15 DIAGNOSIS — I471 Supraventricular tachycardia, unspecified: Secondary | ICD-10-CM | POA: Diagnosis not present

## 2023-05-15 DIAGNOSIS — R293 Abnormal posture: Secondary | ICD-10-CM | POA: Diagnosis not present

## 2023-05-15 DIAGNOSIS — Z48811 Encounter for surgical aftercare following surgery on the nervous system: Secondary | ICD-10-CM | POA: Diagnosis not present

## 2023-05-15 DIAGNOSIS — M62562 Muscle wasting and atrophy, not elsewhere classified, left lower leg: Secondary | ICD-10-CM | POA: Diagnosis not present

## 2023-05-15 DIAGNOSIS — R1310 Dysphagia, unspecified: Secondary | ICD-10-CM | POA: Diagnosis not present

## 2023-05-15 DIAGNOSIS — I491 Atrial premature depolarization: Secondary | ICD-10-CM | POA: Diagnosis not present

## 2023-05-15 DIAGNOSIS — R002 Palpitations: Secondary | ICD-10-CM | POA: Diagnosis not present

## 2023-05-16 DIAGNOSIS — Z48811 Encounter for surgical aftercare following surgery on the nervous system: Secondary | ICD-10-CM | POA: Diagnosis not present

## 2023-05-16 DIAGNOSIS — R1310 Dysphagia, unspecified: Secondary | ICD-10-CM | POA: Diagnosis not present

## 2023-05-16 DIAGNOSIS — R293 Abnormal posture: Secondary | ICD-10-CM | POA: Diagnosis not present

## 2023-05-16 DIAGNOSIS — M62562 Muscle wasting and atrophy, not elsewhere classified, left lower leg: Secondary | ICD-10-CM | POA: Diagnosis not present

## 2023-05-18 DIAGNOSIS — R1310 Dysphagia, unspecified: Secondary | ICD-10-CM | POA: Diagnosis not present

## 2023-05-18 DIAGNOSIS — M62562 Muscle wasting and atrophy, not elsewhere classified, left lower leg: Secondary | ICD-10-CM | POA: Diagnosis not present

## 2023-05-18 DIAGNOSIS — R293 Abnormal posture: Secondary | ICD-10-CM | POA: Diagnosis not present

## 2023-05-18 DIAGNOSIS — Z48811 Encounter for surgical aftercare following surgery on the nervous system: Secondary | ICD-10-CM | POA: Diagnosis not present

## 2023-05-20 DIAGNOSIS — R293 Abnormal posture: Secondary | ICD-10-CM | POA: Diagnosis not present

## 2023-05-20 DIAGNOSIS — M62562 Muscle wasting and atrophy, not elsewhere classified, left lower leg: Secondary | ICD-10-CM | POA: Diagnosis not present

## 2023-05-20 DIAGNOSIS — Z48811 Encounter for surgical aftercare following surgery on the nervous system: Secondary | ICD-10-CM | POA: Diagnosis not present

## 2023-05-20 DIAGNOSIS — R1310 Dysphagia, unspecified: Secondary | ICD-10-CM | POA: Diagnosis not present

## 2023-05-21 DIAGNOSIS — R1310 Dysphagia, unspecified: Secondary | ICD-10-CM | POA: Diagnosis not present

## 2023-05-21 DIAGNOSIS — R293 Abnormal posture: Secondary | ICD-10-CM | POA: Diagnosis not present

## 2023-05-21 DIAGNOSIS — M62562 Muscle wasting and atrophy, not elsewhere classified, left lower leg: Secondary | ICD-10-CM | POA: Diagnosis not present

## 2023-05-21 DIAGNOSIS — Z48811 Encounter for surgical aftercare following surgery on the nervous system: Secondary | ICD-10-CM | POA: Diagnosis not present

## 2023-05-22 DIAGNOSIS — R293 Abnormal posture: Secondary | ICD-10-CM | POA: Diagnosis not present

## 2023-05-22 DIAGNOSIS — Z48811 Encounter for surgical aftercare following surgery on the nervous system: Secondary | ICD-10-CM | POA: Diagnosis not present

## 2023-05-22 DIAGNOSIS — R1310 Dysphagia, unspecified: Secondary | ICD-10-CM | POA: Diagnosis not present

## 2023-05-22 DIAGNOSIS — M62562 Muscle wasting and atrophy, not elsewhere classified, left lower leg: Secondary | ICD-10-CM | POA: Diagnosis not present

## 2023-05-23 DIAGNOSIS — Z48811 Encounter for surgical aftercare following surgery on the nervous system: Secondary | ICD-10-CM | POA: Diagnosis not present

## 2023-05-23 DIAGNOSIS — R1310 Dysphagia, unspecified: Secondary | ICD-10-CM | POA: Diagnosis not present

## 2023-05-23 DIAGNOSIS — R293 Abnormal posture: Secondary | ICD-10-CM | POA: Diagnosis not present

## 2023-05-23 DIAGNOSIS — M62562 Muscle wasting and atrophy, not elsewhere classified, left lower leg: Secondary | ICD-10-CM | POA: Diagnosis not present

## 2023-05-27 DIAGNOSIS — M62562 Muscle wasting and atrophy, not elsewhere classified, left lower leg: Secondary | ICD-10-CM | POA: Diagnosis not present

## 2023-05-27 DIAGNOSIS — Z48811 Encounter for surgical aftercare following surgery on the nervous system: Secondary | ICD-10-CM | POA: Diagnosis not present

## 2023-05-27 DIAGNOSIS — R293 Abnormal posture: Secondary | ICD-10-CM | POA: Diagnosis not present

## 2023-05-31 DIAGNOSIS — M62562 Muscle wasting and atrophy, not elsewhere classified, left lower leg: Secondary | ICD-10-CM | POA: Diagnosis not present

## 2023-05-31 DIAGNOSIS — R293 Abnormal posture: Secondary | ICD-10-CM | POA: Diagnosis not present

## 2023-05-31 DIAGNOSIS — Z48811 Encounter for surgical aftercare following surgery on the nervous system: Secondary | ICD-10-CM | POA: Diagnosis not present

## 2023-06-04 DIAGNOSIS — R293 Abnormal posture: Secondary | ICD-10-CM | POA: Diagnosis not present

## 2023-06-04 DIAGNOSIS — M62562 Muscle wasting and atrophy, not elsewhere classified, left lower leg: Secondary | ICD-10-CM | POA: Diagnosis not present

## 2023-06-04 DIAGNOSIS — Z48811 Encounter for surgical aftercare following surgery on the nervous system: Secondary | ICD-10-CM | POA: Diagnosis not present

## 2023-06-05 DIAGNOSIS — M62562 Muscle wasting and atrophy, not elsewhere classified, left lower leg: Secondary | ICD-10-CM | POA: Diagnosis not present

## 2023-06-05 DIAGNOSIS — R293 Abnormal posture: Secondary | ICD-10-CM | POA: Diagnosis not present

## 2023-06-05 DIAGNOSIS — Z48811 Encounter for surgical aftercare following surgery on the nervous system: Secondary | ICD-10-CM | POA: Diagnosis not present

## 2023-06-06 DIAGNOSIS — M62562 Muscle wasting and atrophy, not elsewhere classified, left lower leg: Secondary | ICD-10-CM | POA: Diagnosis not present

## 2023-06-06 DIAGNOSIS — Z48811 Encounter for surgical aftercare following surgery on the nervous system: Secondary | ICD-10-CM | POA: Diagnosis not present

## 2023-06-06 DIAGNOSIS — R293 Abnormal posture: Secondary | ICD-10-CM | POA: Diagnosis not present

## 2023-06-07 DIAGNOSIS — R293 Abnormal posture: Secondary | ICD-10-CM | POA: Diagnosis not present

## 2023-06-07 DIAGNOSIS — M62562 Muscle wasting and atrophy, not elsewhere classified, left lower leg: Secondary | ICD-10-CM | POA: Diagnosis not present

## 2023-06-07 DIAGNOSIS — Z48811 Encounter for surgical aftercare following surgery on the nervous system: Secondary | ICD-10-CM | POA: Diagnosis not present

## 2023-06-28 DIAGNOSIS — G959 Disease of spinal cord, unspecified: Secondary | ICD-10-CM | POA: Diagnosis not present

## 2023-06-28 DIAGNOSIS — G894 Chronic pain syndrome: Secondary | ICD-10-CM | POA: Diagnosis not present

## 2023-06-28 DIAGNOSIS — I10 Essential (primary) hypertension: Secondary | ICD-10-CM | POA: Diagnosis not present

## 2023-07-08 DIAGNOSIS — Z48811 Encounter for surgical aftercare following surgery on the nervous system: Secondary | ICD-10-CM | POA: Diagnosis not present

## 2023-07-08 DIAGNOSIS — R1310 Dysphagia, unspecified: Secondary | ICD-10-CM | POA: Diagnosis not present

## 2023-07-09 DIAGNOSIS — Z48811 Encounter for surgical aftercare following surgery on the nervous system: Secondary | ICD-10-CM | POA: Diagnosis not present

## 2023-07-09 DIAGNOSIS — R1310 Dysphagia, unspecified: Secondary | ICD-10-CM | POA: Diagnosis not present

## 2023-07-11 DIAGNOSIS — Z48811 Encounter for surgical aftercare following surgery on the nervous system: Secondary | ICD-10-CM | POA: Diagnosis not present

## 2023-07-11 DIAGNOSIS — R1310 Dysphagia, unspecified: Secondary | ICD-10-CM | POA: Diagnosis not present

## 2023-07-12 DIAGNOSIS — R1310 Dysphagia, unspecified: Secondary | ICD-10-CM | POA: Diagnosis not present

## 2023-07-12 DIAGNOSIS — Z48811 Encounter for surgical aftercare following surgery on the nervous system: Secondary | ICD-10-CM | POA: Diagnosis not present

## 2023-07-16 DIAGNOSIS — Z48811 Encounter for surgical aftercare following surgery on the nervous system: Secondary | ICD-10-CM | POA: Diagnosis not present

## 2023-07-16 DIAGNOSIS — R1310 Dysphagia, unspecified: Secondary | ICD-10-CM | POA: Diagnosis not present

## 2023-07-17 DIAGNOSIS — R1310 Dysphagia, unspecified: Secondary | ICD-10-CM | POA: Diagnosis not present

## 2023-07-17 DIAGNOSIS — Z48811 Encounter for surgical aftercare following surgery on the nervous system: Secondary | ICD-10-CM | POA: Diagnosis not present

## 2023-07-18 DIAGNOSIS — Z48811 Encounter for surgical aftercare following surgery on the nervous system: Secondary | ICD-10-CM | POA: Diagnosis not present

## 2023-07-18 DIAGNOSIS — R1310 Dysphagia, unspecified: Secondary | ICD-10-CM | POA: Diagnosis not present

## 2023-07-25 ENCOUNTER — Ambulatory Visit (INDEPENDENT_AMBULATORY_CARE_PROVIDER_SITE_OTHER): Payer: Self-pay | Admitting: Gastroenterology

## 2023-07-25 DIAGNOSIS — I10 Essential (primary) hypertension: Secondary | ICD-10-CM | POA: Diagnosis not present

## 2023-07-31 ENCOUNTER — Ambulatory Visit (INDEPENDENT_AMBULATORY_CARE_PROVIDER_SITE_OTHER): Payer: Medicare Other | Admitting: Gastroenterology

## 2023-07-31 ENCOUNTER — Telehealth (INDEPENDENT_AMBULATORY_CARE_PROVIDER_SITE_OTHER): Payer: Self-pay

## 2023-07-31 VITALS — BP 115/76 | HR 90 | Temp 99.8°F | Wt 145.0 lb

## 2023-07-31 DIAGNOSIS — K5901 Slow transit constipation: Secondary | ICD-10-CM | POA: Insufficient documentation

## 2023-07-31 DIAGNOSIS — D509 Iron deficiency anemia, unspecified: Secondary | ICD-10-CM | POA: Diagnosis not present

## 2023-07-31 DIAGNOSIS — R1310 Dysphagia, unspecified: Secondary | ICD-10-CM | POA: Insufficient documentation

## 2023-07-31 DIAGNOSIS — R1312 Dysphagia, oropharyngeal phase: Secondary | ICD-10-CM | POA: Diagnosis not present

## 2023-07-31 MED ORDER — POLYETHYLENE GLYCOL 3350 17 G PO PACK: PACK | ORAL | 0 refills | Status: AC

## 2023-07-31 MED ORDER — PSYLLIUM 58.6 % PO PACK: 1.0000 | PACK | Freq: Two times a day (BID) | ORAL | 0 refills | Status: AC

## 2023-07-31 NOTE — Progress Notes (Signed)
Vista Lawman , M.D. Gastroenterology & Hepatology [facility] Gastroenterology [address] [place], [place] [address] Primary Care Physician: Richardean Chimera, MD [address] [place] [address]  Chief Complaint:  dysphagia ,constipation   History of Present Illness: Judith Hunt is a 67 y.o. female with cervical myelopathy s/p fusion C3-C7,cervical kyphosis,essential hypertension, chronic left-sided hemiplegia residing in a skilled facility, chronic pain, anxiety and depression   who presents for evaluation of dysphagia and constipation   Patient is slow mentation able to give history.  She reports that she is sometimes choke on drinking liquids and on food.  Reports that she usually eats pured food.  This is not a new problem and has been going on for years.  Reports last endoscopy done 2 to 3 years ago had similar problems. Patient reports having constipation as she has to strain and does not defecate every day.  She has not seen her stool so is not sure if there is blood or melena  Last SLP Eval with modified 2022 reports " Pt presents with a functional oropharyngeal swallow per results  of MBSS. "  Unbleable to see any recent labs but 2015 ferritin was 9.  CBC 2022 with hemoglobin 12.8.  Normal liver enzymes 2023  Last EGD: As per patient 2 to 3 years ago at [facility] for similar Last Colonoscopy: unknown  FHx: neg for any gastrointestinal/liver disease, no malignancies  Past Medical History:essential hypertension, chronic left-sided hemiplegia residing in a skilled facility, chronic pain, anxiety and depression  Sh: CERVICAL FUSION  Family History:No family history on file.  Social History: Social History   Tobacco Use  Smoking Status Not on file  Smokeless Tobacco Not on file   Social History   Substance and Sexual Activity  Alcohol Use Not on file   Social History   Substance and Sexual Activity  Drug Use Not on file     Allergies: Allergies  Allergen Reactions   Azactam In Iso-Osmotic Dextrose [Aztreonam]    Latex    Nickel    Penicillin G Benzathine    Penicillin V Potassium    Penicillins    Thimerosal    Zanaflex [Tizanidine]     Medications: No current outpatient medications on file.   No current facility-administered medications for this visit.    Review of Systems: GENERAL: negative for malaise, night sweats HEENT: No changes in hearing or vision, no nose bleeds or other nasal problems. NECK: Negative for lumps, goiter, pain and significant neck swelling RESPIRATORY: Negative for cough, wheezing CARDIOVASCULAR: Negative for chest pain, leg swelling, palpitations, orthopnea GI: SEE HPI MUSCULOSKELETAL: Negative for joint pain or swelling, back pain, and muscle pain. SKIN: Negative for lesions, rash HEMATOLOGY Negative for prolonged bleeding, bruising easily, and swollen nodes. ENDOCRINE: Negative for cold or heat intolerance, polyuria, polydipsia and goiter. NEURO: negative for tremor, gait imbalance, syncope and seizures. The remainder of the review of systems is noncontributory.   Physical Exam: BP 115/76 (BP Location: Right Arm, Patient Position: Sitting, Cuff Size: Normal)   Pulse 90   Temp 99.8 F (37.7 C) (Oral)   Wt 145 lb (65.8 kg)  GENERAL: The patient is AO x3 ( ALTHOUGH SLOW IN MENTATION ) . sITTING IN WHEELCHAIR LUNGS: Clear to auscultation. No presence of rhonchi/wheezing/rales. Adequate chest expansion HEART: RRR, normal s1 and s2. ABDOMEN: Soft, nontender, no guarding, no peritoneal signs, and nondistended. BS +. No masses.   Imaging/Labs: as above  I personally  reviewed and interpreted the available labs, imaging and endoscopic files.  Impression and Plan:  Judith Hunt is a 67 y.o. female with cervical myelopathy s/p fusion C3-C7,cervical kyphosis,essential hypertension, chronic left-sided hemiplegia residing in a skilled facility, chronic pain,  anxiety and depression   who presents for evaluation of dysphagia and constipation   #Dysphagia  Patient has chronic dysphagia for at least past 3 years.  She reports she had an endoscopy 3 years ago at [facility] for similar reason and in chart I can see her SLP eval 2022  She has typical chronic oropharyngeal dysphagia with choking right after eating especially with liquids  She had cervical fusion and instrumentation and this may have played a role in dysphagia  Will start with esophagogram and modified barium swallow and if any abnormality or dysphagia persists may discuss upper endoscopy in future  #?Iron deficiency  No recent CBC on file but ferritin in 2015 was low and it was iron deficient at one time  No upper endoscopy or colonoscopy report in the system  Would recommend repeating a ferritin and a CBC and if found to be iron deficient, may discuss upper endoscopy and colonoscopy next clinic visit  #Constipation  Patient is bedbound.  And reports occasional straining when defecating every 1 to 2 days.  She may be constipated because of decreased mobility  Recommendation : Ensure adequate fluid intake: Aim for 8 glasses of water daily. Take Miralax twice a day for the first week, then reduce to once daily thereafter. Use Metamucil twice a day.  All questions were answered.      Vista Lawman, MD Gastroenterology and Hepatology [facility] Gastroenterology   This chart has been completed using [facility] Dictation software, and while attempts have been made to ensure accuracy , certain words and phrases may not be transcribed as intended

## 2023-07-31 NOTE — Patient Instructions (Signed)
It was very nice to meet you today, as dicussed with will plan for the following :  1) Obtain esophagram and modified barium swallow- ordered  2) Please obatin CBC and ferritin - if Low ferritin refer back to GI clinic for iron deficiency  3)For constipation : Ensure adequate fluid intake: Aim for 8 glasses of water daily. Take Miralax twice a day for the first week, then reduce to once daily thereafter. Use Metamucil twice a day.

## 2023-08-01 DIAGNOSIS — R1312 Dysphagia, oropharyngeal phase: Secondary | ICD-10-CM | POA: Diagnosis not present

## 2023-08-02 DIAGNOSIS — D72829 Elevated white blood cell count, unspecified: Secondary | ICD-10-CM | POA: Diagnosis not present

## 2023-08-07 ENCOUNTER — Encounter (HOSPITAL_COMMUNITY): Payer: Self-pay | Admitting: Gastroenterology

## 2023-08-07 ENCOUNTER — Encounter (HOSPITAL_COMMUNITY): Payer: Self-pay

## 2023-08-07 ENCOUNTER — Ambulatory Visit (HOSPITAL_COMMUNITY)
Admission: RE | Admit: 2023-08-07 | Discharge: 2023-08-07 | Disposition: A | Discharge status: Home / Self Care | Payer: Medicare Other | Source: Ambulatory Visit | Attending: Gastroenterology | Admitting: Gastroenterology

## 2023-08-07 DIAGNOSIS — R1312 Dysphagia, oropharyngeal phase: Secondary | ICD-10-CM

## 2023-08-24 DIAGNOSIS — E782 Mixed hyperlipidemia: Secondary | ICD-10-CM | POA: Diagnosis not present

## 2023-08-30 DIAGNOSIS — I10 Essential (primary) hypertension: Secondary | ICD-10-CM | POA: Diagnosis not present

## 2023-08-30 DIAGNOSIS — G959 Disease of spinal cord, unspecified: Secondary | ICD-10-CM | POA: Diagnosis not present

## 2023-08-30 DIAGNOSIS — G894 Chronic pain syndrome: Secondary | ICD-10-CM | POA: Diagnosis not present

## 2023-09-05 NOTE — Telephone Encounter (Signed)
Error

## 2023-09-23 DIAGNOSIS — L602 Onychogryphosis: Secondary | ICD-10-CM | POA: Diagnosis not present

## 2023-09-23 DIAGNOSIS — L603 Nail dystrophy: Secondary | ICD-10-CM | POA: Diagnosis not present

## 2023-09-23 DIAGNOSIS — I739 Peripheral vascular disease, unspecified: Secondary | ICD-10-CM | POA: Diagnosis not present

## 2023-10-25 DIAGNOSIS — G894 Chronic pain syndrome: Secondary | ICD-10-CM | POA: Diagnosis not present

## 2023-10-25 DIAGNOSIS — G959 Disease of spinal cord, unspecified: Secondary | ICD-10-CM | POA: Diagnosis not present

## 2023-10-25 DIAGNOSIS — I10 Essential (primary) hypertension: Secondary | ICD-10-CM | POA: Diagnosis not present

## 2023-10-31 ENCOUNTER — Encounter (INDEPENDENT_AMBULATORY_CARE_PROVIDER_SITE_OTHER): Payer: Self-pay | Admitting: Gastroenterology

## 2023-10-31 ENCOUNTER — Ambulatory Visit (INDEPENDENT_AMBULATORY_CARE_PROVIDER_SITE_OTHER): Payer: Medicare Other | Admitting: Gastroenterology

## 2023-10-31 VITALS — BP 162/80 | HR 58 | Temp 98.2°F | Ht 66.0 in | Wt 152.0 lb

## 2023-10-31 DIAGNOSIS — K5901 Slow transit constipation: Secondary | ICD-10-CM

## 2023-10-31 DIAGNOSIS — R1310 Dysphagia, unspecified: Secondary | ICD-10-CM | POA: Diagnosis not present

## 2023-10-31 DIAGNOSIS — R1312 Dysphagia, oropharyngeal phase: Secondary | ICD-10-CM

## 2023-10-31 DIAGNOSIS — K5900 Constipation, unspecified: Secondary | ICD-10-CM | POA: Diagnosis not present

## 2023-10-31 NOTE — Progress Notes (Signed)
Referring Provider: Richardean Chimera, MD Primary Care Physician:  Richardean Chimera, MD Primary GI Physician: Dr. Levon Hedger   Chief Complaint  Patient presents with   Constipation    [facility] is at [facility] and arrives today with Judith Hunt.  Follow up on constipation. States she is doing better with constipation. Takes miralax in the mornings and metamucil as needed. Note from [facility] states she refuses metamucil most days.    Dysphagia    Pt states she is doing better for the most part with dysphagia.    HPI:   Judith Hunt is a 67 y.o. female with past medical history of  cervical myelopathy s/p fusion C3-C7,cervical kyphosis,essential hypertension, chronic left-sided hemiplegia residing in a skilled facility, chronic pain, anxiety and depression   Patient presenting today for follow up of Constipation and dysphagia  Last seen August 2024 by Dr. Tasia Catchings, at that time patient reported symptoms choking on drinking liquids and food, usually eating pured foods.  This is a chronic issue for her.  Last endoscopy reportedly was 2 to 3 years ago due to similar problems.  She also reported constipation and need to strain to defecate.  Patient recommended to have esophagram and modified barium swallow study if any abnormality or dysphagia persist will discuss upper endoscopy, advised to repeat ferritin and CBC due to history of iron deficiency, take MiraLAX twice daily x 1 week then reduce to daily thereafter, Metamucil twice daily.  Hemoglobin was 14 and ferritin 145.4 on 08/01/2023.  Esophagram has not been completed yet.  Present: She is taking miralax 1 capful per day, reports she is not having diarrhea but has a stool often present when she has her diaper changed, staff denies any blood in stool or black stools. Patient denies abdominal pain. She reports she is not taking metamucil as she does not like the taste of it.   Notes she still has some issues with swallowing, usually with thicker foods  such as meats and breads but feels it is better than previously. She notes this occurs maybe 2-3 times per week. Has rare acid reflux symptoms. No nausea or vomiting. She states she is not interested in doing barium swallow testing at this time and reports she will make me aware if swallowing worsens.   Timing of last colonoscopy was maybe 10 years ago, she reports cologuard maybe 5 years ago though no records for either of these.   Last SLP Eval with modified 2022 reports " Pt presents with a functional oropharyngeal swallow per results  of MBSS. "   Last EGD: As per patient 2 to 3 years ago at [facility] for similar Last Colonoscopy: 10 years ago  Did cologuard maybe 5 years ago   Recommendations:    No past medical history on file.  Current Outpatient Medications  Medication Sig Dispense Refill   ARIPiprazole (ABILIFY) 5 MG tablet Take 5 mg by mouth daily.     atorvastatin (LIPITOR) 40 MG tablet Take 40 mg by mouth daily.     DULoxetine (CYMBALTA) 60 MG capsule Take 60 mg by mouth 2 (two) times daily.     fexofenadine (ALLEGRA) 180 MG tablet Take 180 mg by mouth 2 (two) times daily.     gabapentin (NEURONTIN) 800 MG tablet Take 800 mg by mouth 3 (three) times daily.     ibuprofen (ADVIL) 600 MG tablet Take 600 mg by mouth every 8 (eight) hours as needed.     lisinopril (ZESTRIL) 10 MG tablet  Take 10 mg by mouth daily.     metoprolol succinate (TOPROL-XL) 25 MG 24 hr tablet Take 25 mg by mouth daily.     naloxone (NARCAN) nasal spray 4 mg/0.1 mL Place 1 spray into the nose.     oxyCODONE-acetaminophen (PERCOCET/ROXICET) 5-325 MG tablet Take by mouth every 8 (eight) hours as needed for severe pain.     polyethylene glycol (MIRALAX / GLYCOLAX) 17 g packet Take 17 g by mouth two times daily 60 each 0   psyllium (METAMUCIL) 58.6 % packet Take 1 packet by mouth 2 (two) times daily. 60 each 0   traZODone (DESYREL) 100 MG tablet Take 100 mg by mouth at bedtime.     No current  facility-administered medications for this visit.    Allergies as of 10/31/2023 - Review Complete 10/31/2023  Allergen Reaction Noted   Azactam in iso-osmotic dextrose [aztreonam]  07/31/2023   Latex  07/31/2023   Nickel  07/31/2023   Penicillin g benzathine  07/31/2023   Penicillin g procaine  07/31/2023   Penicillin g sodium  07/31/2023   Penicillin v potassium  07/31/2023   Penicillins  07/31/2023   Tape  07/31/2023   Thimerosal  07/31/2023   Zanaflex [tizanidine]  07/31/2023    No family history on file.  Social History   Socioeconomic History   Marital status: Divorced    Spouse name: Not on file   Number of children: Not on file   Years of education: Not on file   Highest education level: Not on file  Occupational History   Not on file  Tobacco Use   Smoking status: Former    Types: Cigarettes    Passive exposure: Past   Smokeless tobacco: Never  Substance and Sexual Activity   Alcohol use: Not Currently   Drug use: Never   Sexual activity: Not on file  Other Topics Concern   Not on file  Social History Narrative   Not on file   Social Determinants of Health   Financial Resource Strain: Low Risk  (10/27/2021)   Received from [facility]   Overall Financial Resource Strain (CARDIA)    Difficulty of Paying Living Expenses: Not hard at all  Food Insecurity: No Food Insecurity (01/02/2022)   Received [facility]   Hunger Vital Sign    Worried About Running Out of Food in the Last Year: Never true    Ran Out of Food in the Last Year: Never true  Transportation Needs: No Transportation Needs (10/04/2023)   Received from [facility]   PRAPARE - Transportation    Lack of Transportation (Medical): No    Lack of Transportation (Non-Medical): No  Physical Activity: Inactive (10/01/2021)   Received from [facility]   Exercise Vital Sign    Days of Exercise per Week: 0 days    Minutes of Exercise per Session: 0 min  Stress: No Stress Concern  Present (10/27/2021)   Received from [facility] - Occupational Stress Questionnaire    Feeling of Stress : Only a little  Social Connections: Unknown (04/25/2022)   Received from [facility]   Social Network    Social Network: Not on file    Review of systems General: negative for malaise, night sweats, fever, chills, weight loss Neck: Negative for lumps, goiter, pain and significant neck swelling Resp: Negative for cough, wheezing, dyspnea at rest CV: Negative for chest pain, leg swelling, palpitations, orthopnea GI: denies melena, hematochezia,  nausea, vomiting, diarrhea, constipation, odyonophagia, early satiety or unintentional weight loss.  The remainder of the review of systems is noncontributory.  Physical Exam: BP (!) 162/80   Pulse (!) 58   Temp 98.2 F (36.8 C) (Oral)   Ht 5\' 6"  (1.676 m)   Wt 152 lb (68.9 kg) Comment: per patient. in wheelchair  BMI 24.53 kg/m  General:   Alert and oriented. No distress noted. Pleasant and cooperative. In wheelchair  Head:  Normocephalic and atraumatic. Eyes:  Conjuctiva clear without scleral icterus. Mouth:  Oral mucosa pink and moist. Good dentition. No lesions. Heart: Normal rate and rhythm, s1 and s2 heart sounds present.  Lungs: Clear lung sounds in all lobes. Respirations equal and unlabored. Abdomen:  +BS, soft, non-tender and non-distended. No rebound or guarding. No HSM or masses noted. Neurologic:  Alert and  oriented x4 Psych:  Alert and cooperative. Normal mood and affect.  Invalid input(s): "6 MONTHS"   ASSESSMENT: MYRACLE FEBRES is a 67 y.o. female presenting today for follow-up of constipation and dysphagia  Constipation seems improved with use of MiraLAX 1 capful daily.  She does note frequent stooling though does not think she is having diarrhea.  She did not start Metamucil per previous recommendations as she does not like the taste of it.  Recommend she start doing  Benefiber 1 tablespoon daily with a meal.  She continue continue with good water intake as well as diet high in fruits, veggies, whole grains.  She feels dysphagia has improved.  She did not complete barium swallow study as previously advised and does not wish to do so at this time.  She would like to continue with dietary modifications as she is doing and will let me know if swallowing worsens or she wishes to proceed with any further evaluation of this.  Reassuringly her weight is stable and dysphagia does not seem to be interfering with her p.o. intake.  Timing of last CRC screening is unknown.  Patient thinks last colonoscopy was maybe done 10 years ago at [facility] and last Cologuard maybe 5 years ago.  I will attempt to obtain these records for further evaluation to determine when her next CRC screening is due.   PLAN:  Continue miralax 17g/day 2. Start benefiber 1T daily with a meal 3. Obtain cologuard and colonoscopy records  4. Continue with chewing precautions 5. Pt to make me aware if she wishes to proceed with BPE/swallowing worsens 6. Increase water intake, aim for atleast 64 oz per day Increase fruits, veggies and whole grains, kiwi and prunes are especially good for constipation   Follow Up: 6 months   [place] Mielke L. Jeanmarie Hubert, MSN, APRN, AGNP-C Adult-Gerontology Nurse Practitioner [facility]

## 2023-10-31 NOTE — Patient Instructions (Signed)
Please continue with miralax 1 capful/17g daily We will start benefiber 1T daily with a meal Increase water intake, aim for atleast 64 oz per day Increase fruits, veggies and whole grains, kiwi and prunes are especially good for constipation  As discussed, if swallowing worsens or you would like to proceed with barium study, please let us know Continue to take small bites, chew thoroughly and take sips of liquids between bites  I will try to obtain last colonoscopy/cologuard records to determine timing of next colon cancer screening  Follow up 6 months  It was a pleasure to see you today. I want to create trusting relationships with patients and provide genuine, compassionate, and quality care. I truly value your feedback! please be on the lookout for a survey regarding your visit with me today. I appreciate your input about our visit and your time in completing this!    Judith Milberger L. Jeanmarie Hubert, MSN, APRN, AGNP-C Adult-Gerontology Nurse Practitioner [facility]astro[facility] at [facility]

## 2023-11-18 DIAGNOSIS — M62521 Muscle wasting and atrophy, not elsewhere classified, right upper arm: Secondary | ICD-10-CM | POA: Diagnosis not present

## 2023-11-18 DIAGNOSIS — M62522 Muscle wasting and atrophy, not elsewhere classified, left upper arm: Secondary | ICD-10-CM | POA: Diagnosis not present

## 2023-11-19 DIAGNOSIS — M62522 Muscle wasting and atrophy, not elsewhere classified, left upper arm: Secondary | ICD-10-CM | POA: Diagnosis not present

## 2023-11-19 DIAGNOSIS — M62521 Muscle wasting and atrophy, not elsewhere classified, right upper arm: Secondary | ICD-10-CM | POA: Diagnosis not present

## 2023-11-20 DIAGNOSIS — M62521 Muscle wasting and atrophy, not elsewhere classified, right upper arm: Secondary | ICD-10-CM | POA: Diagnosis not present

## 2023-11-20 DIAGNOSIS — M62522 Muscle wasting and atrophy, not elsewhere classified, left upper arm: Secondary | ICD-10-CM | POA: Diagnosis not present

## 2023-11-21 DIAGNOSIS — M62521 Muscle wasting and atrophy, not elsewhere classified, right upper arm: Secondary | ICD-10-CM | POA: Diagnosis not present

## 2023-11-21 DIAGNOSIS — M62522 Muscle wasting and atrophy, not elsewhere classified, left upper arm: Secondary | ICD-10-CM | POA: Diagnosis not present

## 2023-11-25 DIAGNOSIS — M62521 Muscle wasting and atrophy, not elsewhere classified, right upper arm: Secondary | ICD-10-CM | POA: Diagnosis not present

## 2023-11-25 DIAGNOSIS — M65261 Calcific tendinitis, right lower leg: Secondary | ICD-10-CM | POA: Diagnosis not present

## 2023-11-25 DIAGNOSIS — M62522 Muscle wasting and atrophy, not elsewhere classified, left upper arm: Secondary | ICD-10-CM | POA: Diagnosis not present

## 2023-11-25 DIAGNOSIS — M62562 Muscle wasting and atrophy, not elsewhere classified, left lower leg: Secondary | ICD-10-CM | POA: Diagnosis not present

## 2023-11-26 DIAGNOSIS — M62562 Muscle wasting and atrophy, not elsewhere classified, left lower leg: Secondary | ICD-10-CM | POA: Diagnosis not present

## 2023-11-26 DIAGNOSIS — M62522 Muscle wasting and atrophy, not elsewhere classified, left upper arm: Secondary | ICD-10-CM | POA: Diagnosis not present

## 2023-11-26 DIAGNOSIS — M65261 Calcific tendinitis, right lower leg: Secondary | ICD-10-CM | POA: Diagnosis not present

## 2023-11-26 DIAGNOSIS — M62521 Muscle wasting and atrophy, not elsewhere classified, right upper arm: Secondary | ICD-10-CM | POA: Diagnosis not present

## 2023-11-27 DIAGNOSIS — M62521 Muscle wasting and atrophy, not elsewhere classified, right upper arm: Secondary | ICD-10-CM | POA: Diagnosis not present

## 2023-11-27 DIAGNOSIS — M62562 Muscle wasting and atrophy, not elsewhere classified, left lower leg: Secondary | ICD-10-CM | POA: Diagnosis not present

## 2023-11-27 DIAGNOSIS — M62522 Muscle wasting and atrophy, not elsewhere classified, left upper arm: Secondary | ICD-10-CM | POA: Diagnosis not present

## 2023-11-27 DIAGNOSIS — M65261 Calcific tendinitis, right lower leg: Secondary | ICD-10-CM | POA: Diagnosis not present

## 2023-11-28 DIAGNOSIS — M62521 Muscle wasting and atrophy, not elsewhere classified, right upper arm: Secondary | ICD-10-CM | POA: Diagnosis not present

## 2023-11-28 DIAGNOSIS — M62562 Muscle wasting and atrophy, not elsewhere classified, left lower leg: Secondary | ICD-10-CM | POA: Diagnosis not present

## 2023-11-28 DIAGNOSIS — M62522 Muscle wasting and atrophy, not elsewhere classified, left upper arm: Secondary | ICD-10-CM | POA: Diagnosis not present

## 2023-11-28 DIAGNOSIS — M65261 Calcific tendinitis, right lower leg: Secondary | ICD-10-CM | POA: Diagnosis not present

## 2023-11-30 DIAGNOSIS — M65261 Calcific tendinitis, right lower leg: Secondary | ICD-10-CM | POA: Diagnosis not present

## 2023-11-30 DIAGNOSIS — M62522 Muscle wasting and atrophy, not elsewhere classified, left upper arm: Secondary | ICD-10-CM | POA: Diagnosis not present

## 2023-11-30 DIAGNOSIS — M62521 Muscle wasting and atrophy, not elsewhere classified, right upper arm: Secondary | ICD-10-CM | POA: Diagnosis not present

## 2023-11-30 DIAGNOSIS — M62562 Muscle wasting and atrophy, not elsewhere classified, left lower leg: Secondary | ICD-10-CM | POA: Diagnosis not present

## 2023-12-02 DIAGNOSIS — M65261 Calcific tendinitis, right lower leg: Secondary | ICD-10-CM | POA: Diagnosis not present

## 2023-12-02 DIAGNOSIS — M62521 Muscle wasting and atrophy, not elsewhere classified, right upper arm: Secondary | ICD-10-CM | POA: Diagnosis not present

## 2023-12-02 DIAGNOSIS — M62562 Muscle wasting and atrophy, not elsewhere classified, left lower leg: Secondary | ICD-10-CM | POA: Diagnosis not present

## 2023-12-02 DIAGNOSIS — M62522 Muscle wasting and atrophy, not elsewhere classified, left upper arm: Secondary | ICD-10-CM | POA: Diagnosis not present

## 2023-12-03 DIAGNOSIS — I739 Peripheral vascular disease, unspecified: Secondary | ICD-10-CM | POA: Diagnosis not present

## 2023-12-03 DIAGNOSIS — M65261 Calcific tendinitis, right lower leg: Secondary | ICD-10-CM | POA: Diagnosis not present

## 2023-12-03 DIAGNOSIS — M62522 Muscle wasting and atrophy, not elsewhere classified, left upper arm: Secondary | ICD-10-CM | POA: Diagnosis not present

## 2023-12-03 DIAGNOSIS — M62521 Muscle wasting and atrophy, not elsewhere classified, right upper arm: Secondary | ICD-10-CM | POA: Diagnosis not present

## 2023-12-03 DIAGNOSIS — L603 Nail dystrophy: Secondary | ICD-10-CM | POA: Diagnosis not present

## 2023-12-03 DIAGNOSIS — M62562 Muscle wasting and atrophy, not elsewhere classified, left lower leg: Secondary | ICD-10-CM | POA: Diagnosis not present

## 2023-12-03 DIAGNOSIS — L602 Onychogryphosis: Secondary | ICD-10-CM | POA: Diagnosis not present

## 2023-12-04 DIAGNOSIS — M62521 Muscle wasting and atrophy, not elsewhere classified, right upper arm: Secondary | ICD-10-CM | POA: Diagnosis not present

## 2023-12-04 DIAGNOSIS — M62562 Muscle wasting and atrophy, not elsewhere classified, left lower leg: Secondary | ICD-10-CM | POA: Diagnosis not present

## 2023-12-04 DIAGNOSIS — M62522 Muscle wasting and atrophy, not elsewhere classified, left upper arm: Secondary | ICD-10-CM | POA: Diagnosis not present

## 2023-12-04 DIAGNOSIS — M65261 Calcific tendinitis, right lower leg: Secondary | ICD-10-CM | POA: Diagnosis not present

## 2023-12-05 DIAGNOSIS — M62521 Muscle wasting and atrophy, not elsewhere classified, right upper arm: Secondary | ICD-10-CM | POA: Diagnosis not present

## 2023-12-05 DIAGNOSIS — M62522 Muscle wasting and atrophy, not elsewhere classified, left upper arm: Secondary | ICD-10-CM | POA: Diagnosis not present

## 2023-12-05 DIAGNOSIS — M65261 Calcific tendinitis, right lower leg: Secondary | ICD-10-CM | POA: Diagnosis not present

## 2023-12-05 DIAGNOSIS — M62562 Muscle wasting and atrophy, not elsewhere classified, left lower leg: Secondary | ICD-10-CM | POA: Diagnosis not present

## 2023-12-06 DIAGNOSIS — M65261 Calcific tendinitis, right lower leg: Secondary | ICD-10-CM | POA: Diagnosis not present

## 2023-12-06 DIAGNOSIS — M62562 Muscle wasting and atrophy, not elsewhere classified, left lower leg: Secondary | ICD-10-CM | POA: Diagnosis not present

## 2023-12-06 DIAGNOSIS — M62521 Muscle wasting and atrophy, not elsewhere classified, right upper arm: Secondary | ICD-10-CM | POA: Diagnosis not present

## 2023-12-06 DIAGNOSIS — M62522 Muscle wasting and atrophy, not elsewhere classified, left upper arm: Secondary | ICD-10-CM | POA: Diagnosis not present

## 2023-12-08 DIAGNOSIS — M62522 Muscle wasting and atrophy, not elsewhere classified, left upper arm: Secondary | ICD-10-CM | POA: Diagnosis not present

## 2023-12-08 DIAGNOSIS — M62521 Muscle wasting and atrophy, not elsewhere classified, right upper arm: Secondary | ICD-10-CM | POA: Diagnosis not present

## 2023-12-08 DIAGNOSIS — M65261 Calcific tendinitis, right lower leg: Secondary | ICD-10-CM | POA: Diagnosis not present

## 2023-12-08 DIAGNOSIS — M62562 Muscle wasting and atrophy, not elsewhere classified, left lower leg: Secondary | ICD-10-CM | POA: Diagnosis not present

## 2023-12-09 DIAGNOSIS — M65261 Calcific tendinitis, right lower leg: Secondary | ICD-10-CM | POA: Diagnosis not present

## 2023-12-09 DIAGNOSIS — M62521 Muscle wasting and atrophy, not elsewhere classified, right upper arm: Secondary | ICD-10-CM | POA: Diagnosis not present

## 2023-12-09 DIAGNOSIS — M62522 Muscle wasting and atrophy, not elsewhere classified, left upper arm: Secondary | ICD-10-CM | POA: Diagnosis not present

## 2023-12-09 DIAGNOSIS — M62562 Muscle wasting and atrophy, not elsewhere classified, left lower leg: Secondary | ICD-10-CM | POA: Diagnosis not present

## 2023-12-10 DIAGNOSIS — M65261 Calcific tendinitis, right lower leg: Secondary | ICD-10-CM | POA: Diagnosis not present

## 2023-12-10 DIAGNOSIS — M62562 Muscle wasting and atrophy, not elsewhere classified, left lower leg: Secondary | ICD-10-CM | POA: Diagnosis not present

## 2023-12-10 DIAGNOSIS — M62522 Muscle wasting and atrophy, not elsewhere classified, left upper arm: Secondary | ICD-10-CM | POA: Diagnosis not present

## 2023-12-10 DIAGNOSIS — M62521 Muscle wasting and atrophy, not elsewhere classified, right upper arm: Secondary | ICD-10-CM | POA: Diagnosis not present

## 2023-12-11 DIAGNOSIS — M62521 Muscle wasting and atrophy, not elsewhere classified, right upper arm: Secondary | ICD-10-CM | POA: Diagnosis not present

## 2023-12-11 DIAGNOSIS — M62522 Muscle wasting and atrophy, not elsewhere classified, left upper arm: Secondary | ICD-10-CM | POA: Diagnosis not present

## 2023-12-11 DIAGNOSIS — M65261 Calcific tendinitis, right lower leg: Secondary | ICD-10-CM | POA: Diagnosis not present

## 2023-12-11 DIAGNOSIS — M62562 Muscle wasting and atrophy, not elsewhere classified, left lower leg: Secondary | ICD-10-CM | POA: Diagnosis not present

## 2023-12-12 DIAGNOSIS — M62562 Muscle wasting and atrophy, not elsewhere classified, left lower leg: Secondary | ICD-10-CM | POA: Diagnosis not present

## 2023-12-12 DIAGNOSIS — M65261 Calcific tendinitis, right lower leg: Secondary | ICD-10-CM | POA: Diagnosis not present

## 2023-12-12 DIAGNOSIS — M62521 Muscle wasting and atrophy, not elsewhere classified, right upper arm: Secondary | ICD-10-CM | POA: Diagnosis not present

## 2023-12-12 DIAGNOSIS — M62522 Muscle wasting and atrophy, not elsewhere classified, left upper arm: Secondary | ICD-10-CM | POA: Diagnosis not present

## 2023-12-13 DIAGNOSIS — M65261 Calcific tendinitis, right lower leg: Secondary | ICD-10-CM | POA: Diagnosis not present

## 2023-12-13 DIAGNOSIS — M62521 Muscle wasting and atrophy, not elsewhere classified, right upper arm: Secondary | ICD-10-CM | POA: Diagnosis not present

## 2023-12-13 DIAGNOSIS — M62522 Muscle wasting and atrophy, not elsewhere classified, left upper arm: Secondary | ICD-10-CM | POA: Diagnosis not present

## 2023-12-13 DIAGNOSIS — M62562 Muscle wasting and atrophy, not elsewhere classified, left lower leg: Secondary | ICD-10-CM | POA: Diagnosis not present

## 2023-12-15 DIAGNOSIS — G959 Disease of spinal cord, unspecified: Secondary | ICD-10-CM | POA: Diagnosis not present

## 2023-12-15 DIAGNOSIS — I10 Essential (primary) hypertension: Secondary | ICD-10-CM | POA: Diagnosis not present

## 2023-12-15 DIAGNOSIS — G894 Chronic pain syndrome: Secondary | ICD-10-CM | POA: Diagnosis not present

## 2023-12-16 DIAGNOSIS — M62521 Muscle wasting and atrophy, not elsewhere classified, right upper arm: Secondary | ICD-10-CM | POA: Diagnosis not present

## 2023-12-16 DIAGNOSIS — M62522 Muscle wasting and atrophy, not elsewhere classified, left upper arm: Secondary | ICD-10-CM | POA: Diagnosis not present

## 2023-12-16 DIAGNOSIS — M65261 Calcific tendinitis, right lower leg: Secondary | ICD-10-CM | POA: Diagnosis not present

## 2023-12-16 DIAGNOSIS — M62562 Muscle wasting and atrophy, not elsewhere classified, left lower leg: Secondary | ICD-10-CM | POA: Diagnosis not present

## 2023-12-17 DIAGNOSIS — M62521 Muscle wasting and atrophy, not elsewhere classified, right upper arm: Secondary | ICD-10-CM | POA: Diagnosis not present

## 2023-12-17 DIAGNOSIS — M62562 Muscle wasting and atrophy, not elsewhere classified, left lower leg: Secondary | ICD-10-CM | POA: Diagnosis not present

## 2023-12-17 DIAGNOSIS — M65261 Calcific tendinitis, right lower leg: Secondary | ICD-10-CM | POA: Diagnosis not present

## 2023-12-17 DIAGNOSIS — M62522 Muscle wasting and atrophy, not elsewhere classified, left upper arm: Secondary | ICD-10-CM | POA: Diagnosis not present

## 2023-12-19 DIAGNOSIS — M65261 Calcific tendinitis, right lower leg: Secondary | ICD-10-CM | POA: Diagnosis not present

## 2023-12-19 DIAGNOSIS — M62521 Muscle wasting and atrophy, not elsewhere classified, right upper arm: Secondary | ICD-10-CM | POA: Diagnosis not present

## 2023-12-19 DIAGNOSIS — M62562 Muscle wasting and atrophy, not elsewhere classified, left lower leg: Secondary | ICD-10-CM | POA: Diagnosis not present

## 2023-12-19 DIAGNOSIS — M62522 Muscle wasting and atrophy, not elsewhere classified, left upper arm: Secondary | ICD-10-CM | POA: Diagnosis not present

## 2023-12-20 DIAGNOSIS — M62521 Muscle wasting and atrophy, not elsewhere classified, right upper arm: Secondary | ICD-10-CM | POA: Diagnosis not present

## 2023-12-20 DIAGNOSIS — M65261 Calcific tendinitis, right lower leg: Secondary | ICD-10-CM | POA: Diagnosis not present

## 2023-12-20 DIAGNOSIS — M62562 Muscle wasting and atrophy, not elsewhere classified, left lower leg: Secondary | ICD-10-CM | POA: Diagnosis not present

## 2023-12-20 DIAGNOSIS — M62522 Muscle wasting and atrophy, not elsewhere classified, left upper arm: Secondary | ICD-10-CM | POA: Diagnosis not present

## 2023-12-21 DIAGNOSIS — M62521 Muscle wasting and atrophy, not elsewhere classified, right upper arm: Secondary | ICD-10-CM | POA: Diagnosis not present

## 2023-12-21 DIAGNOSIS — M62562 Muscle wasting and atrophy, not elsewhere classified, left lower leg: Secondary | ICD-10-CM | POA: Diagnosis not present

## 2023-12-21 DIAGNOSIS — M62522 Muscle wasting and atrophy, not elsewhere classified, left upper arm: Secondary | ICD-10-CM | POA: Diagnosis not present

## 2023-12-21 DIAGNOSIS — M65261 Calcific tendinitis, right lower leg: Secondary | ICD-10-CM | POA: Diagnosis not present

## 2023-12-23 DIAGNOSIS — M62521 Muscle wasting and atrophy, not elsewhere classified, right upper arm: Secondary | ICD-10-CM | POA: Diagnosis not present

## 2023-12-23 DIAGNOSIS — M65261 Calcific tendinitis, right lower leg: Secondary | ICD-10-CM | POA: Diagnosis not present

## 2023-12-23 DIAGNOSIS — M62522 Muscle wasting and atrophy, not elsewhere classified, left upper arm: Secondary | ICD-10-CM | POA: Diagnosis not present

## 2023-12-23 DIAGNOSIS — M62562 Muscle wasting and atrophy, not elsewhere classified, left lower leg: Secondary | ICD-10-CM | POA: Diagnosis not present

## 2023-12-24 DIAGNOSIS — M65261 Calcific tendinitis, right lower leg: Secondary | ICD-10-CM | POA: Diagnosis not present

## 2023-12-24 DIAGNOSIS — M62522 Muscle wasting and atrophy, not elsewhere classified, left upper arm: Secondary | ICD-10-CM | POA: Diagnosis not present

## 2023-12-24 DIAGNOSIS — M62521 Muscle wasting and atrophy, not elsewhere classified, right upper arm: Secondary | ICD-10-CM | POA: Diagnosis not present

## 2023-12-24 DIAGNOSIS — M62562 Muscle wasting and atrophy, not elsewhere classified, left lower leg: Secondary | ICD-10-CM | POA: Diagnosis not present

## 2023-12-25 DIAGNOSIS — M62422 Contracture of muscle, left upper arm: Secondary | ICD-10-CM | POA: Diagnosis not present

## 2023-12-25 DIAGNOSIS — M62562 Muscle wasting and atrophy, not elsewhere classified, left lower leg: Secondary | ICD-10-CM | POA: Diagnosis not present

## 2023-12-25 DIAGNOSIS — M62519 Muscle wasting and atrophy, not elsewhere classified, unspecified shoulder: Secondary | ICD-10-CM | POA: Diagnosis not present

## 2023-12-25 DIAGNOSIS — M62561 Muscle wasting and atrophy, not elsewhere classified, right lower leg: Secondary | ICD-10-CM | POA: Diagnosis not present

## 2023-12-26 DIAGNOSIS — M62561 Muscle wasting and atrophy, not elsewhere classified, right lower leg: Secondary | ICD-10-CM | POA: Diagnosis not present

## 2023-12-26 DIAGNOSIS — M62422 Contracture of muscle, left upper arm: Secondary | ICD-10-CM | POA: Diagnosis not present

## 2023-12-26 DIAGNOSIS — M62519 Muscle wasting and atrophy, not elsewhere classified, unspecified shoulder: Secondary | ICD-10-CM | POA: Diagnosis not present

## 2023-12-26 DIAGNOSIS — M62562 Muscle wasting and atrophy, not elsewhere classified, left lower leg: Secondary | ICD-10-CM | POA: Diagnosis not present

## 2023-12-27 DIAGNOSIS — M62422 Contracture of muscle, left upper arm: Secondary | ICD-10-CM | POA: Diagnosis not present

## 2023-12-27 DIAGNOSIS — M62561 Muscle wasting and atrophy, not elsewhere classified, right lower leg: Secondary | ICD-10-CM | POA: Diagnosis not present

## 2023-12-27 DIAGNOSIS — M62519 Muscle wasting and atrophy, not elsewhere classified, unspecified shoulder: Secondary | ICD-10-CM | POA: Diagnosis not present

## 2023-12-27 DIAGNOSIS — M62562 Muscle wasting and atrophy, not elsewhere classified, left lower leg: Secondary | ICD-10-CM | POA: Diagnosis not present

## 2023-12-28 DIAGNOSIS — M62519 Muscle wasting and atrophy, not elsewhere classified, unspecified shoulder: Secondary | ICD-10-CM | POA: Diagnosis not present

## 2023-12-28 DIAGNOSIS — M62561 Muscle wasting and atrophy, not elsewhere classified, right lower leg: Secondary | ICD-10-CM | POA: Diagnosis not present

## 2023-12-28 DIAGNOSIS — M62562 Muscle wasting and atrophy, not elsewhere classified, left lower leg: Secondary | ICD-10-CM | POA: Diagnosis not present

## 2023-12-28 DIAGNOSIS — M62422 Contracture of muscle, left upper arm: Secondary | ICD-10-CM | POA: Diagnosis not present

## 2023-12-31 DIAGNOSIS — M62422 Contracture of muscle, left upper arm: Secondary | ICD-10-CM | POA: Diagnosis not present

## 2023-12-31 DIAGNOSIS — M62561 Muscle wasting and atrophy, not elsewhere classified, right lower leg: Secondary | ICD-10-CM | POA: Diagnosis not present

## 2023-12-31 DIAGNOSIS — M62562 Muscle wasting and atrophy, not elsewhere classified, left lower leg: Secondary | ICD-10-CM | POA: Diagnosis not present

## 2023-12-31 DIAGNOSIS — M62519 Muscle wasting and atrophy, not elsewhere classified, unspecified shoulder: Secondary | ICD-10-CM | POA: Diagnosis not present

## 2024-01-01 DIAGNOSIS — M62422 Contracture of muscle, left upper arm: Secondary | ICD-10-CM | POA: Diagnosis not present

## 2024-01-01 DIAGNOSIS — M62562 Muscle wasting and atrophy, not elsewhere classified, left lower leg: Secondary | ICD-10-CM | POA: Diagnosis not present

## 2024-01-01 DIAGNOSIS — M62519 Muscle wasting and atrophy, not elsewhere classified, unspecified shoulder: Secondary | ICD-10-CM | POA: Diagnosis not present

## 2024-01-01 DIAGNOSIS — M62561 Muscle wasting and atrophy, not elsewhere classified, right lower leg: Secondary | ICD-10-CM | POA: Diagnosis not present

## 2024-01-02 DIAGNOSIS — M62519 Muscle wasting and atrophy, not elsewhere classified, unspecified shoulder: Secondary | ICD-10-CM | POA: Diagnosis not present

## 2024-01-02 DIAGNOSIS — M62562 Muscle wasting and atrophy, not elsewhere classified, left lower leg: Secondary | ICD-10-CM | POA: Diagnosis not present

## 2024-01-02 DIAGNOSIS — M62561 Muscle wasting and atrophy, not elsewhere classified, right lower leg: Secondary | ICD-10-CM | POA: Diagnosis not present

## 2024-01-02 DIAGNOSIS — M62422 Contracture of muscle, left upper arm: Secondary | ICD-10-CM | POA: Diagnosis not present

## 2024-01-03 DIAGNOSIS — M62519 Muscle wasting and atrophy, not elsewhere classified, unspecified shoulder: Secondary | ICD-10-CM | POA: Diagnosis not present

## 2024-01-03 DIAGNOSIS — M62561 Muscle wasting and atrophy, not elsewhere classified, right lower leg: Secondary | ICD-10-CM | POA: Diagnosis not present

## 2024-01-03 DIAGNOSIS — M62422 Contracture of muscle, left upper arm: Secondary | ICD-10-CM | POA: Diagnosis not present

## 2024-01-03 DIAGNOSIS — M62562 Muscle wasting and atrophy, not elsewhere classified, left lower leg: Secondary | ICD-10-CM | POA: Diagnosis not present

## 2024-01-06 DIAGNOSIS — M62562 Muscle wasting and atrophy, not elsewhere classified, left lower leg: Secondary | ICD-10-CM | POA: Diagnosis not present

## 2024-01-06 DIAGNOSIS — M62422 Contracture of muscle, left upper arm: Secondary | ICD-10-CM | POA: Diagnosis not present

## 2024-01-06 DIAGNOSIS — M62519 Muscle wasting and atrophy, not elsewhere classified, unspecified shoulder: Secondary | ICD-10-CM | POA: Diagnosis not present

## 2024-01-06 DIAGNOSIS — M62561 Muscle wasting and atrophy, not elsewhere classified, right lower leg: Secondary | ICD-10-CM | POA: Diagnosis not present

## 2024-01-07 DIAGNOSIS — M62561 Muscle wasting and atrophy, not elsewhere classified, right lower leg: Secondary | ICD-10-CM | POA: Diagnosis not present

## 2024-01-07 DIAGNOSIS — M62422 Contracture of muscle, left upper arm: Secondary | ICD-10-CM | POA: Diagnosis not present

## 2024-01-07 DIAGNOSIS — M62562 Muscle wasting and atrophy, not elsewhere classified, left lower leg: Secondary | ICD-10-CM | POA: Diagnosis not present

## 2024-01-07 DIAGNOSIS — M62519 Muscle wasting and atrophy, not elsewhere classified, unspecified shoulder: Secondary | ICD-10-CM | POA: Diagnosis not present

## 2024-01-08 DIAGNOSIS — M62562 Muscle wasting and atrophy, not elsewhere classified, left lower leg: Secondary | ICD-10-CM | POA: Diagnosis not present

## 2024-01-08 DIAGNOSIS — M62561 Muscle wasting and atrophy, not elsewhere classified, right lower leg: Secondary | ICD-10-CM | POA: Diagnosis not present

## 2024-01-08 DIAGNOSIS — M62422 Contracture of muscle, left upper arm: Secondary | ICD-10-CM | POA: Diagnosis not present

## 2024-01-08 DIAGNOSIS — M62519 Muscle wasting and atrophy, not elsewhere classified, unspecified shoulder: Secondary | ICD-10-CM | POA: Diagnosis not present

## 2024-01-09 DIAGNOSIS — M62562 Muscle wasting and atrophy, not elsewhere classified, left lower leg: Secondary | ICD-10-CM | POA: Diagnosis not present

## 2024-01-09 DIAGNOSIS — M62561 Muscle wasting and atrophy, not elsewhere classified, right lower leg: Secondary | ICD-10-CM | POA: Diagnosis not present

## 2024-01-09 DIAGNOSIS — M62422 Contracture of muscle, left upper arm: Secondary | ICD-10-CM | POA: Diagnosis not present

## 2024-01-09 DIAGNOSIS — M62519 Muscle wasting and atrophy, not elsewhere classified, unspecified shoulder: Secondary | ICD-10-CM | POA: Diagnosis not present

## 2024-01-13 DIAGNOSIS — M62519 Muscle wasting and atrophy, not elsewhere classified, unspecified shoulder: Secondary | ICD-10-CM | POA: Diagnosis not present

## 2024-01-13 DIAGNOSIS — M62422 Contracture of muscle, left upper arm: Secondary | ICD-10-CM | POA: Diagnosis not present

## 2024-01-13 DIAGNOSIS — M62561 Muscle wasting and atrophy, not elsewhere classified, right lower leg: Secondary | ICD-10-CM | POA: Diagnosis not present

## 2024-01-13 DIAGNOSIS — M62562 Muscle wasting and atrophy, not elsewhere classified, left lower leg: Secondary | ICD-10-CM | POA: Diagnosis not present

## 2024-01-14 DIAGNOSIS — M62562 Muscle wasting and atrophy, not elsewhere classified, left lower leg: Secondary | ICD-10-CM | POA: Diagnosis not present

## 2024-01-14 DIAGNOSIS — M62561 Muscle wasting and atrophy, not elsewhere classified, right lower leg: Secondary | ICD-10-CM | POA: Diagnosis not present

## 2024-01-14 DIAGNOSIS — M62422 Contracture of muscle, left upper arm: Secondary | ICD-10-CM | POA: Diagnosis not present

## 2024-01-14 DIAGNOSIS — M62519 Muscle wasting and atrophy, not elsewhere classified, unspecified shoulder: Secondary | ICD-10-CM | POA: Diagnosis not present

## 2024-01-15 DIAGNOSIS — M62562 Muscle wasting and atrophy, not elsewhere classified, left lower leg: Secondary | ICD-10-CM | POA: Diagnosis not present

## 2024-01-15 DIAGNOSIS — M62561 Muscle wasting and atrophy, not elsewhere classified, right lower leg: Secondary | ICD-10-CM | POA: Diagnosis not present

## 2024-01-15 DIAGNOSIS — M62422 Contracture of muscle, left upper arm: Secondary | ICD-10-CM | POA: Diagnosis not present

## 2024-01-15 DIAGNOSIS — M62519 Muscle wasting and atrophy, not elsewhere classified, unspecified shoulder: Secondary | ICD-10-CM | POA: Diagnosis not present

## 2024-01-16 DIAGNOSIS — M62562 Muscle wasting and atrophy, not elsewhere classified, left lower leg: Secondary | ICD-10-CM | POA: Diagnosis not present

## 2024-01-16 DIAGNOSIS — M62561 Muscle wasting and atrophy, not elsewhere classified, right lower leg: Secondary | ICD-10-CM | POA: Diagnosis not present

## 2024-01-16 DIAGNOSIS — M62422 Contracture of muscle, left upper arm: Secondary | ICD-10-CM | POA: Diagnosis not present

## 2024-01-16 DIAGNOSIS — M62519 Muscle wasting and atrophy, not elsewhere classified, unspecified shoulder: Secondary | ICD-10-CM | POA: Diagnosis not present

## 2024-01-17 DIAGNOSIS — M62561 Muscle wasting and atrophy, not elsewhere classified, right lower leg: Secondary | ICD-10-CM | POA: Diagnosis not present

## 2024-01-17 DIAGNOSIS — M62519 Muscle wasting and atrophy, not elsewhere classified, unspecified shoulder: Secondary | ICD-10-CM | POA: Diagnosis not present

## 2024-01-17 DIAGNOSIS — M62562 Muscle wasting and atrophy, not elsewhere classified, left lower leg: Secondary | ICD-10-CM | POA: Diagnosis not present

## 2024-01-17 DIAGNOSIS — M62422 Contracture of muscle, left upper arm: Secondary | ICD-10-CM | POA: Diagnosis not present

## 2024-01-20 DIAGNOSIS — M62422 Contracture of muscle, left upper arm: Secondary | ICD-10-CM | POA: Diagnosis not present

## 2024-01-20 DIAGNOSIS — M62519 Muscle wasting and atrophy, not elsewhere classified, unspecified shoulder: Secondary | ICD-10-CM | POA: Diagnosis not present

## 2024-01-20 DIAGNOSIS — M62561 Muscle wasting and atrophy, not elsewhere classified, right lower leg: Secondary | ICD-10-CM | POA: Diagnosis not present

## 2024-01-20 DIAGNOSIS — M62562 Muscle wasting and atrophy, not elsewhere classified, left lower leg: Secondary | ICD-10-CM | POA: Diagnosis not present

## 2024-01-21 DIAGNOSIS — M62561 Muscle wasting and atrophy, not elsewhere classified, right lower leg: Secondary | ICD-10-CM | POA: Diagnosis not present

## 2024-01-21 DIAGNOSIS — M62562 Muscle wasting and atrophy, not elsewhere classified, left lower leg: Secondary | ICD-10-CM | POA: Diagnosis not present

## 2024-01-21 DIAGNOSIS — M62519 Muscle wasting and atrophy, not elsewhere classified, unspecified shoulder: Secondary | ICD-10-CM | POA: Diagnosis not present

## 2024-01-21 DIAGNOSIS — M62422 Contracture of muscle, left upper arm: Secondary | ICD-10-CM | POA: Diagnosis not present

## 2024-01-22 DIAGNOSIS — M62519 Muscle wasting and atrophy, not elsewhere classified, unspecified shoulder: Secondary | ICD-10-CM | POA: Diagnosis not present

## 2024-01-22 DIAGNOSIS — M62561 Muscle wasting and atrophy, not elsewhere classified, right lower leg: Secondary | ICD-10-CM | POA: Diagnosis not present

## 2024-01-22 DIAGNOSIS — M62562 Muscle wasting and atrophy, not elsewhere classified, left lower leg: Secondary | ICD-10-CM | POA: Diagnosis not present

## 2024-01-22 DIAGNOSIS — M62422 Contracture of muscle, left upper arm: Secondary | ICD-10-CM | POA: Diagnosis not present

## 2024-01-23 DIAGNOSIS — M62422 Contracture of muscle, left upper arm: Secondary | ICD-10-CM | POA: Diagnosis not present

## 2024-01-23 DIAGNOSIS — M62519 Muscle wasting and atrophy, not elsewhere classified, unspecified shoulder: Secondary | ICD-10-CM | POA: Diagnosis not present

## 2024-01-23 DIAGNOSIS — M62562 Muscle wasting and atrophy, not elsewhere classified, left lower leg: Secondary | ICD-10-CM | POA: Diagnosis not present

## 2024-01-23 DIAGNOSIS — M62561 Muscle wasting and atrophy, not elsewhere classified, right lower leg: Secondary | ICD-10-CM | POA: Diagnosis not present

## 2024-01-24 DIAGNOSIS — M62562 Muscle wasting and atrophy, not elsewhere classified, left lower leg: Secondary | ICD-10-CM | POA: Diagnosis not present

## 2024-01-24 DIAGNOSIS — M62519 Muscle wasting and atrophy, not elsewhere classified, unspecified shoulder: Secondary | ICD-10-CM | POA: Diagnosis not present

## 2024-01-24 DIAGNOSIS — M62561 Muscle wasting and atrophy, not elsewhere classified, right lower leg: Secondary | ICD-10-CM | POA: Diagnosis not present

## 2024-01-24 DIAGNOSIS — M62422 Contracture of muscle, left upper arm: Secondary | ICD-10-CM | POA: Diagnosis not present

## 2024-01-27 DIAGNOSIS — M62561 Muscle wasting and atrophy, not elsewhere classified, right lower leg: Secondary | ICD-10-CM | POA: Diagnosis not present

## 2024-01-27 DIAGNOSIS — M62562 Muscle wasting and atrophy, not elsewhere classified, left lower leg: Secondary | ICD-10-CM | POA: Diagnosis not present

## 2024-01-27 DIAGNOSIS — M62521 Muscle wasting and atrophy, not elsewhere classified, right upper arm: Secondary | ICD-10-CM | POA: Diagnosis not present

## 2024-01-27 DIAGNOSIS — M62522 Muscle wasting and atrophy, not elsewhere classified, left upper arm: Secondary | ICD-10-CM | POA: Diagnosis not present

## 2024-01-28 DIAGNOSIS — M62522 Muscle wasting and atrophy, not elsewhere classified, left upper arm: Secondary | ICD-10-CM | POA: Diagnosis not present

## 2024-01-28 DIAGNOSIS — M62562 Muscle wasting and atrophy, not elsewhere classified, left lower leg: Secondary | ICD-10-CM | POA: Diagnosis not present

## 2024-01-28 DIAGNOSIS — M62561 Muscle wasting and atrophy, not elsewhere classified, right lower leg: Secondary | ICD-10-CM | POA: Diagnosis not present

## 2024-01-28 DIAGNOSIS — M62521 Muscle wasting and atrophy, not elsewhere classified, right upper arm: Secondary | ICD-10-CM | POA: Diagnosis not present

## 2024-01-29 DIAGNOSIS — I10 Essential (primary) hypertension: Secondary | ICD-10-CM | POA: Diagnosis not present

## 2024-01-29 DIAGNOSIS — Z791 Long term (current) use of non-steroidal anti-inflammatories (NSAID): Secondary | ICD-10-CM | POA: Diagnosis not present

## 2024-01-31 DIAGNOSIS — M62561 Muscle wasting and atrophy, not elsewhere classified, right lower leg: Secondary | ICD-10-CM | POA: Diagnosis not present

## 2024-01-31 DIAGNOSIS — M62522 Muscle wasting and atrophy, not elsewhere classified, left upper arm: Secondary | ICD-10-CM | POA: Diagnosis not present

## 2024-01-31 DIAGNOSIS — M62521 Muscle wasting and atrophy, not elsewhere classified, right upper arm: Secondary | ICD-10-CM | POA: Diagnosis not present

## 2024-01-31 DIAGNOSIS — M62562 Muscle wasting and atrophy, not elsewhere classified, left lower leg: Secondary | ICD-10-CM | POA: Diagnosis not present

## 2024-02-03 DIAGNOSIS — M62561 Muscle wasting and atrophy, not elsewhere classified, right lower leg: Secondary | ICD-10-CM | POA: Diagnosis not present

## 2024-02-03 DIAGNOSIS — M62522 Muscle wasting and atrophy, not elsewhere classified, left upper arm: Secondary | ICD-10-CM | POA: Diagnosis not present

## 2024-02-03 DIAGNOSIS — M62521 Muscle wasting and atrophy, not elsewhere classified, right upper arm: Secondary | ICD-10-CM | POA: Diagnosis not present

## 2024-02-03 DIAGNOSIS — M62562 Muscle wasting and atrophy, not elsewhere classified, left lower leg: Secondary | ICD-10-CM | POA: Diagnosis not present

## 2024-02-04 DIAGNOSIS — M62522 Muscle wasting and atrophy, not elsewhere classified, left upper arm: Secondary | ICD-10-CM | POA: Diagnosis not present

## 2024-02-04 DIAGNOSIS — M62521 Muscle wasting and atrophy, not elsewhere classified, right upper arm: Secondary | ICD-10-CM | POA: Diagnosis not present

## 2024-02-04 DIAGNOSIS — M62562 Muscle wasting and atrophy, not elsewhere classified, left lower leg: Secondary | ICD-10-CM | POA: Diagnosis not present

## 2024-02-04 DIAGNOSIS — M62561 Muscle wasting and atrophy, not elsewhere classified, right lower leg: Secondary | ICD-10-CM | POA: Diagnosis not present

## 2024-02-05 DIAGNOSIS — M62561 Muscle wasting and atrophy, not elsewhere classified, right lower leg: Secondary | ICD-10-CM | POA: Diagnosis not present

## 2024-02-05 DIAGNOSIS — R059 Cough, unspecified: Secondary | ICD-10-CM | POA: Diagnosis not present

## 2024-02-05 DIAGNOSIS — M62522 Muscle wasting and atrophy, not elsewhere classified, left upper arm: Secondary | ICD-10-CM | POA: Diagnosis not present

## 2024-02-05 DIAGNOSIS — R0981 Nasal congestion: Secondary | ICD-10-CM | POA: Diagnosis not present

## 2024-02-05 DIAGNOSIS — M62521 Muscle wasting and atrophy, not elsewhere classified, right upper arm: Secondary | ICD-10-CM | POA: Diagnosis not present

## 2024-02-05 DIAGNOSIS — M62562 Muscle wasting and atrophy, not elsewhere classified, left lower leg: Secondary | ICD-10-CM | POA: Diagnosis not present

## 2024-02-06 DIAGNOSIS — M62521 Muscle wasting and atrophy, not elsewhere classified, right upper arm: Secondary | ICD-10-CM | POA: Diagnosis not present

## 2024-02-06 DIAGNOSIS — M62562 Muscle wasting and atrophy, not elsewhere classified, left lower leg: Secondary | ICD-10-CM | POA: Diagnosis not present

## 2024-02-06 DIAGNOSIS — M62522 Muscle wasting and atrophy, not elsewhere classified, left upper arm: Secondary | ICD-10-CM | POA: Diagnosis not present

## 2024-02-06 DIAGNOSIS — M62561 Muscle wasting and atrophy, not elsewhere classified, right lower leg: Secondary | ICD-10-CM | POA: Diagnosis not present

## 2024-02-07 DIAGNOSIS — M62561 Muscle wasting and atrophy, not elsewhere classified, right lower leg: Secondary | ICD-10-CM | POA: Diagnosis not present

## 2024-02-07 DIAGNOSIS — M62521 Muscle wasting and atrophy, not elsewhere classified, right upper arm: Secondary | ICD-10-CM | POA: Diagnosis not present

## 2024-02-07 DIAGNOSIS — M62522 Muscle wasting and atrophy, not elsewhere classified, left upper arm: Secondary | ICD-10-CM | POA: Diagnosis not present

## 2024-02-07 DIAGNOSIS — R7981 Abnormal blood-gas level: Secondary | ICD-10-CM | POA: Diagnosis not present

## 2024-02-07 DIAGNOSIS — M62562 Muscle wasting and atrophy, not elsewhere classified, left lower leg: Secondary | ICD-10-CM | POA: Diagnosis not present

## 2024-02-08 DIAGNOSIS — M62521 Muscle wasting and atrophy, not elsewhere classified, right upper arm: Secondary | ICD-10-CM | POA: Diagnosis not present

## 2024-02-08 DIAGNOSIS — M62562 Muscle wasting and atrophy, not elsewhere classified, left lower leg: Secondary | ICD-10-CM | POA: Diagnosis not present

## 2024-02-08 DIAGNOSIS — M62522 Muscle wasting and atrophy, not elsewhere classified, left upper arm: Secondary | ICD-10-CM | POA: Diagnosis not present

## 2024-02-08 DIAGNOSIS — M62561 Muscle wasting and atrophy, not elsewhere classified, right lower leg: Secondary | ICD-10-CM | POA: Diagnosis not present

## 2024-02-08 DIAGNOSIS — I10 Essential (primary) hypertension: Secondary | ICD-10-CM | POA: Diagnosis not present

## 2024-02-08 DIAGNOSIS — G959 Disease of spinal cord, unspecified: Secondary | ICD-10-CM | POA: Diagnosis not present

## 2024-02-10 DIAGNOSIS — M62522 Muscle wasting and atrophy, not elsewhere classified, left upper arm: Secondary | ICD-10-CM | POA: Diagnosis not present

## 2024-02-10 DIAGNOSIS — M62562 Muscle wasting and atrophy, not elsewhere classified, left lower leg: Secondary | ICD-10-CM | POA: Diagnosis not present

## 2024-02-10 DIAGNOSIS — M62521 Muscle wasting and atrophy, not elsewhere classified, right upper arm: Secondary | ICD-10-CM | POA: Diagnosis not present

## 2024-02-10 DIAGNOSIS — M62561 Muscle wasting and atrophy, not elsewhere classified, right lower leg: Secondary | ICD-10-CM | POA: Diagnosis not present

## 2024-02-11 DIAGNOSIS — M62561 Muscle wasting and atrophy, not elsewhere classified, right lower leg: Secondary | ICD-10-CM | POA: Diagnosis not present

## 2024-02-11 DIAGNOSIS — M62521 Muscle wasting and atrophy, not elsewhere classified, right upper arm: Secondary | ICD-10-CM | POA: Diagnosis not present

## 2024-02-11 DIAGNOSIS — M62522 Muscle wasting and atrophy, not elsewhere classified, left upper arm: Secondary | ICD-10-CM | POA: Diagnosis not present

## 2024-02-11 DIAGNOSIS — M62562 Muscle wasting and atrophy, not elsewhere classified, left lower leg: Secondary | ICD-10-CM | POA: Diagnosis not present

## 2024-02-12 DIAGNOSIS — M62521 Muscle wasting and atrophy, not elsewhere classified, right upper arm: Secondary | ICD-10-CM | POA: Diagnosis not present

## 2024-02-12 DIAGNOSIS — M62522 Muscle wasting and atrophy, not elsewhere classified, left upper arm: Secondary | ICD-10-CM | POA: Diagnosis not present

## 2024-02-12 DIAGNOSIS — M62562 Muscle wasting and atrophy, not elsewhere classified, left lower leg: Secondary | ICD-10-CM | POA: Diagnosis not present

## 2024-02-12 DIAGNOSIS — M62561 Muscle wasting and atrophy, not elsewhere classified, right lower leg: Secondary | ICD-10-CM | POA: Diagnosis not present

## 2024-02-13 DIAGNOSIS — M62561 Muscle wasting and atrophy, not elsewhere classified, right lower leg: Secondary | ICD-10-CM | POA: Diagnosis not present

## 2024-02-13 DIAGNOSIS — M62521 Muscle wasting and atrophy, not elsewhere classified, right upper arm: Secondary | ICD-10-CM | POA: Diagnosis not present

## 2024-02-13 DIAGNOSIS — M62562 Muscle wasting and atrophy, not elsewhere classified, left lower leg: Secondary | ICD-10-CM | POA: Diagnosis not present

## 2024-02-13 DIAGNOSIS — M62522 Muscle wasting and atrophy, not elsewhere classified, left upper arm: Secondary | ICD-10-CM | POA: Diagnosis not present

## 2024-02-14 DIAGNOSIS — M62562 Muscle wasting and atrophy, not elsewhere classified, left lower leg: Secondary | ICD-10-CM | POA: Diagnosis not present

## 2024-02-14 DIAGNOSIS — M62561 Muscle wasting and atrophy, not elsewhere classified, right lower leg: Secondary | ICD-10-CM | POA: Diagnosis not present

## 2024-02-14 DIAGNOSIS — M62522 Muscle wasting and atrophy, not elsewhere classified, left upper arm: Secondary | ICD-10-CM | POA: Diagnosis not present

## 2024-02-14 DIAGNOSIS — M62521 Muscle wasting and atrophy, not elsewhere classified, right upper arm: Secondary | ICD-10-CM | POA: Diagnosis not present

## 2024-02-17 DIAGNOSIS — M62561 Muscle wasting and atrophy, not elsewhere classified, right lower leg: Secondary | ICD-10-CM | POA: Diagnosis not present

## 2024-02-17 DIAGNOSIS — M62522 Muscle wasting and atrophy, not elsewhere classified, left upper arm: Secondary | ICD-10-CM | POA: Diagnosis not present

## 2024-02-17 DIAGNOSIS — M62521 Muscle wasting and atrophy, not elsewhere classified, right upper arm: Secondary | ICD-10-CM | POA: Diagnosis not present

## 2024-02-17 DIAGNOSIS — M62562 Muscle wasting and atrophy, not elsewhere classified, left lower leg: Secondary | ICD-10-CM | POA: Diagnosis not present

## 2024-02-22 DIAGNOSIS — I10 Essential (primary) hypertension: Secondary | ICD-10-CM | POA: Diagnosis not present

## 2024-02-22 DIAGNOSIS — G894 Chronic pain syndrome: Secondary | ICD-10-CM | POA: Diagnosis not present

## 2024-03-31 DIAGNOSIS — I10 Essential (primary) hypertension: Secondary | ICD-10-CM | POA: Diagnosis not present

## 2024-03-31 DIAGNOSIS — G959 Disease of spinal cord, unspecified: Secondary | ICD-10-CM | POA: Diagnosis not present

## 2024-03-31 DIAGNOSIS — G894 Chronic pain syndrome: Secondary | ICD-10-CM | POA: Diagnosis not present

## 2024-04-03 DIAGNOSIS — L602 Onychogryphosis: Secondary | ICD-10-CM | POA: Diagnosis not present

## 2024-04-03 DIAGNOSIS — L603 Nail dystrophy: Secondary | ICD-10-CM | POA: Diagnosis not present

## 2024-04-03 DIAGNOSIS — I739 Peripheral vascular disease, unspecified: Secondary | ICD-10-CM | POA: Diagnosis not present

## 2024-04-20 DIAGNOSIS — M62562 Muscle wasting and atrophy, not elsewhere classified, left lower leg: Secondary | ICD-10-CM | POA: Diagnosis not present

## 2024-04-20 DIAGNOSIS — M62561 Muscle wasting and atrophy, not elsewhere classified, right lower leg: Secondary | ICD-10-CM | POA: Diagnosis not present

## 2024-04-21 DIAGNOSIS — M62561 Muscle wasting and atrophy, not elsewhere classified, right lower leg: Secondary | ICD-10-CM | POA: Diagnosis not present

## 2024-04-21 DIAGNOSIS — M62562 Muscle wasting and atrophy, not elsewhere classified, left lower leg: Secondary | ICD-10-CM | POA: Diagnosis not present

## 2024-04-22 DIAGNOSIS — M62561 Muscle wasting and atrophy, not elsewhere classified, right lower leg: Secondary | ICD-10-CM | POA: Diagnosis not present

## 2024-04-22 DIAGNOSIS — M62562 Muscle wasting and atrophy, not elsewhere classified, left lower leg: Secondary | ICD-10-CM | POA: Diagnosis not present

## 2024-04-24 DIAGNOSIS — M62561 Muscle wasting and atrophy, not elsewhere classified, right lower leg: Secondary | ICD-10-CM | POA: Diagnosis not present

## 2024-04-24 DIAGNOSIS — M62521 Muscle wasting and atrophy, not elsewhere classified, right upper arm: Secondary | ICD-10-CM | POA: Diagnosis not present

## 2024-04-24 DIAGNOSIS — R1310 Dysphagia, unspecified: Secondary | ICD-10-CM | POA: Diagnosis not present

## 2024-04-24 DIAGNOSIS — M62522 Muscle wasting and atrophy, not elsewhere classified, left upper arm: Secondary | ICD-10-CM | POA: Diagnosis not present

## 2024-04-24 DIAGNOSIS — R2689 Other abnormalities of gait and mobility: Secondary | ICD-10-CM | POA: Diagnosis not present

## 2024-04-24 DIAGNOSIS — R2681 Unsteadiness on feet: Secondary | ICD-10-CM | POA: Diagnosis not present

## 2024-04-24 DIAGNOSIS — M62562 Muscle wasting and atrophy, not elsewhere classified, left lower leg: Secondary | ICD-10-CM | POA: Diagnosis not present

## 2024-04-27 DIAGNOSIS — M62562 Muscle wasting and atrophy, not elsewhere classified, left lower leg: Secondary | ICD-10-CM | POA: Diagnosis not present

## 2024-04-27 DIAGNOSIS — R2681 Unsteadiness on feet: Secondary | ICD-10-CM | POA: Diagnosis not present

## 2024-04-27 DIAGNOSIS — M62561 Muscle wasting and atrophy, not elsewhere classified, right lower leg: Secondary | ICD-10-CM | POA: Diagnosis not present

## 2024-04-27 DIAGNOSIS — M62522 Muscle wasting and atrophy, not elsewhere classified, left upper arm: Secondary | ICD-10-CM | POA: Diagnosis not present

## 2024-04-27 DIAGNOSIS — R2689 Other abnormalities of gait and mobility: Secondary | ICD-10-CM | POA: Diagnosis not present

## 2024-04-27 DIAGNOSIS — M62521 Muscle wasting and atrophy, not elsewhere classified, right upper arm: Secondary | ICD-10-CM | POA: Diagnosis not present

## 2024-04-27 DIAGNOSIS — R1310 Dysphagia, unspecified: Secondary | ICD-10-CM | POA: Diagnosis not present

## 2024-04-28 DIAGNOSIS — R2681 Unsteadiness on feet: Secondary | ICD-10-CM | POA: Diagnosis not present

## 2024-04-28 DIAGNOSIS — M62562 Muscle wasting and atrophy, not elsewhere classified, left lower leg: Secondary | ICD-10-CM | POA: Diagnosis not present

## 2024-04-28 DIAGNOSIS — M62561 Muscle wasting and atrophy, not elsewhere classified, right lower leg: Secondary | ICD-10-CM | POA: Diagnosis not present

## 2024-04-28 DIAGNOSIS — M62522 Muscle wasting and atrophy, not elsewhere classified, left upper arm: Secondary | ICD-10-CM | POA: Diagnosis not present

## 2024-04-28 DIAGNOSIS — R1310 Dysphagia, unspecified: Secondary | ICD-10-CM | POA: Diagnosis not present

## 2024-04-28 DIAGNOSIS — R2689 Other abnormalities of gait and mobility: Secondary | ICD-10-CM | POA: Diagnosis not present

## 2024-04-28 DIAGNOSIS — M62521 Muscle wasting and atrophy, not elsewhere classified, right upper arm: Secondary | ICD-10-CM | POA: Diagnosis not present

## 2024-04-29 DIAGNOSIS — M62562 Muscle wasting and atrophy, not elsewhere classified, left lower leg: Secondary | ICD-10-CM | POA: Diagnosis not present

## 2024-04-29 DIAGNOSIS — M62521 Muscle wasting and atrophy, not elsewhere classified, right upper arm: Secondary | ICD-10-CM | POA: Diagnosis not present

## 2024-04-29 DIAGNOSIS — R2689 Other abnormalities of gait and mobility: Secondary | ICD-10-CM | POA: Diagnosis not present

## 2024-04-29 DIAGNOSIS — M62561 Muscle wasting and atrophy, not elsewhere classified, right lower leg: Secondary | ICD-10-CM | POA: Diagnosis not present

## 2024-04-29 DIAGNOSIS — R2681 Unsteadiness on feet: Secondary | ICD-10-CM | POA: Diagnosis not present

## 2024-04-29 DIAGNOSIS — R1310 Dysphagia, unspecified: Secondary | ICD-10-CM | POA: Diagnosis not present

## 2024-04-29 DIAGNOSIS — M62522 Muscle wasting and atrophy, not elsewhere classified, left upper arm: Secondary | ICD-10-CM | POA: Diagnosis not present

## 2024-04-30 ENCOUNTER — Ambulatory Visit (INDEPENDENT_AMBULATORY_CARE_PROVIDER_SITE_OTHER): Payer: Medicare Other | Admitting: Gastroenterology

## 2024-04-30 ENCOUNTER — Encounter (INDEPENDENT_AMBULATORY_CARE_PROVIDER_SITE_OTHER): Payer: Self-pay | Admitting: Gastroenterology

## 2024-04-30 VITALS — BP 98/65 | HR 58 | Temp 98.7°F | Ht 66.0 in | Wt 158.0 lb

## 2024-04-30 DIAGNOSIS — K5900 Constipation, unspecified: Secondary | ICD-10-CM

## 2024-04-30 DIAGNOSIS — R2689 Other abnormalities of gait and mobility: Secondary | ICD-10-CM | POA: Diagnosis not present

## 2024-04-30 DIAGNOSIS — M62561 Muscle wasting and atrophy, not elsewhere classified, right lower leg: Secondary | ICD-10-CM | POA: Diagnosis not present

## 2024-04-30 DIAGNOSIS — R1310 Dysphagia, unspecified: Secondary | ICD-10-CM

## 2024-04-30 DIAGNOSIS — K5901 Slow transit constipation: Secondary | ICD-10-CM

## 2024-04-30 DIAGNOSIS — M62562 Muscle wasting and atrophy, not elsewhere classified, left lower leg: Secondary | ICD-10-CM | POA: Diagnosis not present

## 2024-04-30 DIAGNOSIS — M62522 Muscle wasting and atrophy, not elsewhere classified, left upper arm: Secondary | ICD-10-CM | POA: Diagnosis not present

## 2024-04-30 DIAGNOSIS — R2681 Unsteadiness on feet: Secondary | ICD-10-CM | POA: Diagnosis not present

## 2024-04-30 DIAGNOSIS — M62521 Muscle wasting and atrophy, not elsewhere classified, right upper arm: Secondary | ICD-10-CM | POA: Diagnosis not present

## 2024-04-30 NOTE — Patient Instructions (Signed)
 We will get you scheduled for upper endoscopy for further evaluation of your symptoms Increase water intake, aim for atleast 64 oz per day Increase fruits, veggies and whole grains, kiwi and prunes are especially good for constipation Continue with benefiber as you are doing  Follow up 4 months  It was a pleasure to see you today. I want to create trusting relationships with patients and provide genuine, compassionate, and quality care. I truly value your feedback! please be on the lookout for a survey regarding your visit with me today. I appreciate your input about our visit and your time in completing this!    Yaeli Hartung L. [place] Dost, MSN, APRN, AGNP-C Adult-Gerontology Nurse Practitioner [facility] Gastroenterology at [facility]

## 2024-04-30 NOTE — Progress Notes (Signed)
 Referring Provider: Leesa Pulling, MD Primary Care Physician:  Leesa Pulling, MD Primary GI Physician: Dr. Alita Irwin   Chief Complaint  Patient presents with   Dysphagia    [facility] is at [facility]. Coming in for a follow up. States she is still having trouble swallowing. States she has gotten choked on meats and breads.    HPI:   Judith Hunt is a 68 y.o. female with past medical history of  cervical myelopathy s/p fusion C3-C7,cervical kyphosis,essential hypertension, chronic left-sided hemiplegia residing in a skilled facility, chronic pain, anxiety and depression   Patient presenting today for:  Dysphagia and constipation   Last seen November 2024, at that time taking 1 capful of MiraLAX  per day with frequent stools.  Not taking Metamucil.  Continued issues with swallowing, usually with thicker foods such as meats and breads.  Current 2-3 times per week.  Rare GERD symptoms.  Patient declined barium swallow study.  Recommend he continue MiraLAX  1 capful daily, start Benefiber 1 tablespoon daily with a meal, obtain Cologuard and colonoscopy records, continue to precautions, patient to make me aware if she wished to proceed with the BPE or if swallowing became worse good water intake, diet high in fruits, veggies, whole grains.  Present: Still with dysphagia, almost daily. Worse with certain foods. Breads and meats tend to give her more issues. She can usually get foods to pass with drinking more liquids. Not having to cough foods back up. She only has heartburn if she eats a banana. No nausea or vomiting. Sometimes has pain when she swallowing. Still declining barium swallow exam as she is afraid she will not be able to complete the test due to her dysphagia, she would rather do EGD.   No abdominal pain. Constipation improved. She is eating prunes. She is not doing miralax . Doing benefiber, staff at nursing facility puts some in her water each morning. Notes water  intake is not great. Stools generally pass easily, occasionally has to strain. No rectal bleeding or melena.    Last SLP Eval with modified 2022 reports " Pt presents with a functional oropharyngeal swallow per results  of MBSS. " Last EGD: As per patient 2 to 3 years ago at [facility] for similar Last Colonoscopy: 2010 normal  Did cologuard maybe 5 years ago   [employer]   04/30/24 1005  Weight: 158 lb (71.7 kg)     No past medical history on file.  Current Outpatient Medications  Medication Sig Dispense Refill   atorvastatin (LIPITOR) 40 MG tablet Take 40 mg by mouth daily.     DULoxetine (CYMBALTA) 60 MG capsule Take 60 mg by mouth 2 (two) times daily.     fexofenadine (ALLEGRA) 180 MG tablet Take 180 mg by mouth 2 (two) times daily.     gabapentin (NEURONTIN) 800 MG tablet Take 800 mg by mouth 3 (three) times daily.     ibuprofen (ADVIL) 600 MG tablet Take 600 mg by mouth every 8 (eight) hours as needed.     lisinopril (ZESTRIL) 10 MG tablet Take 10 mg by mouth daily.     metoprolol succinate (TOPROL-XL) 25 MG 24 hr tablet Take 25 mg by mouth daily.     naloxone (NARCAN) nasal spray 4 mg/0.1 mL Place 1 spray into the nose.     oxyCODONE-acetaminophen  (PERCOCET/ROXICET) 5-325 MG tablet Take by mouth every 8 (eight) hours as needed for severe pain.     polyethylene glycol (MIRALAX  /  GLYCOLAX ) 17 g packet Take 17 g by mouth two times daily 60 each 0   psyllium (METAMUCIL) 58.6 % packet Take 1 packet by mouth 2 (two) times daily. 60 each 0   traZODone (DESYREL) 100 MG tablet Take 100 mg by mouth at bedtime.     ARIPiprazole (ABILIFY) 5 MG tablet Take 5 mg by mouth daily. (Patient not taking: Reported on 04/30/2024)     No current facility-administered medications for this visit.    Allergies as of 04/30/2024 - Review Complete 04/30/2024  Allergen Reaction Noted   Azactam in iso-osmotic dextrose [aztreonam]  07/31/2023   Latex  07/31/2023   Nickel  07/31/2023   Penicillin g  benzathine  07/31/2023   Penicillin g procaine  07/31/2023   Penicillin g sodium  07/31/2023   Penicillin v potassium  07/31/2023   Penicillins  07/31/2023   Tape  07/31/2023   Thimerosal  07/31/2023   Zanaflex [tizanidine]  07/31/2023    Social History   Socioeconomic History   Marital status: Divorced    Spouse name: Not on file   Number of children: Not on file   Years of education: Not on file   Highest education level: Not on file  Occupational History   Not on file  Tobacco Use   Smoking status: Former    Types: Cigarettes    Passive exposure: Past   Smokeless tobacco: Never  Substance and Sexual Activity   Alcohol  use: Not Currently   Drug use: Never   Sexual activity: Not on file  Other Topics Concern   Not on file  Social History Narrative   Not on file   Social Drivers of Health   Financial Resource Strain: Low Risk  (10/27/2021)   Received from [facility]   Overall Financial Resource Strain (CARDIA)    Difficulty of Paying Living Expenses: Not hard at all  Food Insecurity: No Food Insecurity (01/02/2022)   Received from [facility]   Hunger Vital Sign    Worried About Running Out of Food in the Last Year: Never true    Ran Out of Food in the Last Year: Never true  Transportation Needs: No Transportation Needs (03/10/2024)   Received from [facility]   PRAPARE - Transportation    Lack of Transportation (Medical): No    Lack of Transportation (Non-Medical): No  Physical Activity: Inactive (10/01/2021)   Received from [facility]   Exercise Vital Sign    Days of Exercise per Week: 0 days    Minutes of Exercise per Session: 0 min  Stress: No Stress Concern Present (10/27/2021)   Received from [facility] - Occupational Stress Questionnaire    Feeling of Stress : Only a little  Social Connections: Unknown (04/25/2022)   Received from [facility]   Social Network    Social Network: Not on file     Review of systems General: negative for malaise, night sweats, fever, chills, weight loss Neck: Negative for lumps, goiter, pain and significant neck swelling Resp: Negative for cough, wheezing, dyspnea at rest CV: Negative for chest pain, leg swelling, palpitations, orthopnea GI: denies melena, hematochezia, nausea, vomiting, diarrhea, constipation, odyonophagia, early satiety or unintentional weight loss. +dysphagia  The remainder of the review of systems is noncontributory.  Physical Exam: BP 98/65   Pulse (!) 58   Temp 98.7 F (37.1 C) (Oral)   Ht 5\' 6"  (1.676 m)   Wt 158 lb (71.7 kg) Comment:  per [facility]  BMI 25.50 kg/m  General:   Alert and oriented. No distress noted. Pleasant and cooperative.  Head:  Normocephalic and atraumatic. Eyes:  Conjuctiva clear without scleral icterus. Mouth:  Oral mucosa pink and moist. Good dentition. No lesions. Heart: Normal rate and rhythm, s1 and s2 heart sounds present.  Lungs: Clear lung sounds in all lobes. Respirations equal and unlabored. Abdomen:  +BS, soft, non-tender and non-distended. No rebound or guarding. No HSM or masses noted. Neurologic:  Alert and  oriented x4 Psych:  Alert and cooperative. Normal mood and affect.  Invalid input(s): "6 MONTHS"   ASSESSMENT: Judith Hunt is a 68 y.o. female presenting today for follow up of constipation and dysphagia  Constipation: mostly well managed with prunes and benefiber, rarely has to strain. Encouraged to continue with current bowel regimen, good water intake, diet high in fruits, veggies, whole grains   Dysphagia: ongoing. Patient Reports EGD in the past though no records available for review. MBSS in 2022 with functional oropharyngeal swallow. She notes dysphagia almost daily, worse with meats and breads. She has declined MBS in the past and continues to decline as she is concerned she cannot complete the test due to her swallowing difficulties. At this time I  think it would be reasonable to proceed with EGD to evaluate for esophageal causes of her dysphagia. I did discuss with her that we may recommend barium swallow evaluation after EGD if no findings to explain her symptoms and dysphagia persists. Indications, risks and benefits of procedure discussed in detail with patient. Patient verbalized understanding and is in agreement to proceed with EGD.   PLAN:  -chewing precautions, avoiding thicker/drier foods, sips of liquids between bites, chew thoroughly -schedule EGD, ASA III -Increase water intake, aim for atleast 64 oz per day Increase fruits, veggies and whole grains, kiwi and prunes are especially good for constipation -continue benefiber  All questions were answered, patient verbalized understanding and is in agreement with plan as outlined above.    Follow Up: 4 months   Laurelyn Terrero L. Geraldo Haris, MSN, APRN, AGNP-C Adult-Gerontology Nurse Practitioner [facility]

## 2024-04-30 NOTE — H&P (View-Only) (Signed)
 Referring Provider: Leesa Pulling, MD Primary Care Physician:  Leesa Pulling, MD Primary GI Physician: Dr. Alita Irwin   Chief Complaint  Patient presents with   Dysphagia    Patient is at [facility]. Coming in for a follow up. States she is still having trouble swallowing. States she has gotten choked on meats and breads.    HPI:   Judith Hunt is a 68 y.o. female with past medical history of  cervical myelopathy s/p fusion C3-C7,cervical kyphosis,essential hypertension, chronic left-sided hemiplegia residing in a skilled facility, chronic pain, anxiety and depression   Patient presenting today for:  Dysphagia and constipation   Last seen November 2024, at that time taking 1 capful of MiraLAX  per day with frequent stools.  Not taking Metamucil.  Continued issues with swallowing, usually with thicker foods such as meats and breads.  Current 2-3 times per week.  Rare GERD symptoms.  Patient declined barium swallow study.  Recommend he continue MiraLAX  1 capful daily, start Benefiber 1 tablespoon daily with a meal, obtain Cologuard and colonoscopy records, continue to precautions, patient to make me aware if she wished to proceed with the BPE or if swallowing became worse good water intake, diet high in fruits, veggies, whole grains.  Present: Still with dysphagia, almost daily. Worse with certain foods. Breads and meats tend to give her more issues. She can usually get foods to pass with drinking more liquids. Not having to cough foods back up. She only has heartburn if she eats a banana. No nausea or vomiting. Sometimes has pain when she swallowing. Still declining barium swallow exam as she is afraid she will not be able to complete the test due to her dysphagia, she would rather do EGD.   No abdominal pain. Constipation improved. She is eating prunes. She is not doing miralax . Doing benefiber, staff at nursing facility puts some in her water each morning. Notes water  intake is not great. Stools generally pass easily, occasionally has to strain. No rectal bleeding or melena.    Last SLP Eval with modified 2022 reports " Pt presents with a functional oropharyngeal swallow per results  of MBSS. " Last EGD: As per patient 2 to 3 years ago at [facility] for similar Last Colonoscopy: 2010 normal  Did cologuard maybe 5 years ago   [employer]   04/30/24 1005  Weight: 158 lb (71.7 kg)     No past medical history on file.  Current Outpatient Medications  Medication Sig Dispense Refill   atorvastatin (LIPITOR) 40 MG tablet Take 40 mg by mouth daily.     DULoxetine (CYMBALTA) 60 MG capsule Take 60 mg by mouth 2 (two) times daily.     fexofenadine (ALLEGRA) 180 MG tablet Take 180 mg by mouth 2 (two) times daily.     gabapentin (NEURONTIN) 800 MG tablet Take 800 mg by mouth 3 (three) times daily.     ibuprofen (ADVIL) 600 MG tablet Take 600 mg by mouth every 8 (eight) hours as needed.     lisinopril (ZESTRIL) 10 MG tablet Take 10 mg by mouth daily.     metoprolol succinate (TOPROL-XL) 25 MG 24 hr tablet Take 25 mg by mouth daily.     naloxone (NARCAN) nasal spray 4 mg/0.1 mL Place 1 spray into the nose.     oxyCODONE-acetaminophen  (PERCOCET/ROXICET) 5-325 MG tablet Take by mouth every 8 (eight) hours as needed for severe pain.     polyethylene glycol (MIRALAX  /  GLYCOLAX ) 17 g packet Take 17 g by mouth two times daily 60 each 0   psyllium (METAMUCIL) 58.6 % packet Take 1 packet by mouth 2 (two) times daily. 60 each 0   traZODone (DESYREL) 100 MG tablet Take 100 mg by mouth at bedtime.     ARIPiprazole (ABILIFY) 5 MG tablet Take 5 mg by mouth daily. (Patient not taking: Reported on 04/30/2024)     No current facility-administered medications for this visit.    Allergies as of 04/30/2024 - Review Complete 04/30/2024  Allergen Reaction Noted   Azactam in iso-osmotic dextrose [aztreonam]  07/31/2023   Latex  07/31/2023   Nickel  07/31/2023   Penicillin g  benzathine  07/31/2023   Penicillin g procaine  07/31/2023   Penicillin g sodium  07/31/2023   Penicillin v potassium  07/31/2023   Penicillins  07/31/2023   Tape  07/31/2023   Thimerosal  07/31/2023   Zanaflex [tizanidine]  07/31/2023    Social History   Socioeconomic History   Marital status: Divorced    Spouse name: Not on file   Number of children: Not on file   Years of education: Not on file   Highest education level: Not on file  Occupational History   Not on file  Tobacco Use   Smoking status: Former    Types: Cigarettes    Passive exposure: Past   Smokeless tobacco: Never  Substance and Sexual Activity   Alcohol  use: Not Currently   Drug use: Never   Sexual activity: Not on file  Other Topics Concern   Not on file  Social History Narrative   Not on file   Social Drivers of Health   Financial Resource Strain: Low Risk  (10/27/2021)   Received from [facility]   Overall Financial Resource Strain (CARDIA)    Difficulty of Paying Living Expenses: Not hard at all  Food Insecurity: No Food Insecurity (01/02/2022)   Received from [facility]   Hunger Vital Sign    Worried About Running Out of Food in the Last Year: Never true    Ran Out of Food in the Last Year: Never true  Transportation Needs: No Transportation Needs (03/10/2024)   Received from [facility]   PRAPARE - Transportation    Lack of Transportation (Medical): No    Lack of Transportation (Non-Medical): No  Physical Activity: Inactive (10/01/2021)   Received from [facility]   Exercise Vital Sign    Days of Exercise per Week: 0 days    Minutes of Exercise per Session: 0 min  Stress: No Stress Concern Present (10/27/2021)   Received from [facility] - Occupational Stress Questionnaire    Feeling of Stress : Only a little  Social Connections: Unknown (04/25/2022)   Received from [facility]   Social Network    Social Network: Not on file     Review of systems General: negative for malaise, night sweats, fever, chills, weight loss Neck: Negative for lumps, goiter, pain and significant neck swelling Resp: Negative for cough, wheezing, dyspnea at rest CV: Negative for chest pain, leg swelling, palpitations, orthopnea GI: denies melena, hematochezia, nausea, vomiting, diarrhea, constipation, odyonophagia, early satiety or unintentional weight loss. +dysphagia  The remainder of the review of systems is noncontributory.  Physical Exam: BP 98/65   Pulse (!) 58   Temp 98.7 F (37.1 C) (Oral)   Ht 5\' 6"  (1.676 m)   Wt 158 lb (71.7 kg) Comment:  per [facility]  BMI 25.50 kg/m  General:   Alert and oriented. No distress noted. Pleasant and cooperative.  Head:  Normocephalic and atraumatic. Eyes:  Conjuctiva clear without scleral icterus. Mouth:  Oral mucosa pink and moist. Good dentition. No lesions. Heart: Normal rate and rhythm, s1 and s2 heart sounds present.  Lungs: Clear lung sounds in all lobes. Respirations equal and unlabored. Abdomen:  +BS, soft, non-tender and non-distended. No rebound or guarding. No HSM or masses noted. Neurologic:  Alert and  oriented x4 Psych:  Alert and cooperative. Normal mood and affect.  Invalid input(s): "6 MONTHS"   ASSESSMENT: Judith Hunt is a 68 y.o. female presenting today for follow up of constipation and dysphagia  Constipation: mostly well managed with prunes and benefiber, rarely has to strain. Encouraged to continue with current bowel regimen, good water intake, diet high in fruits, veggies, whole grains   Dysphagia: ongoing. Patient Reports EGD in the past though no records available for review. MBSS in 2022 with functional oropharyngeal swallow. She notes dysphagia almost daily, worse with meats and breads. She has declined MBS in the past and continues to decline as she is concerned she cannot complete the test due to her swallowing difficulties. At this time I  think it would be reasonable to proceed with EGD to evaluate for esophageal causes of her dysphagia. I did discuss with her that we may recommend barium swallow evaluation after EGD if no findings to explain her symptoms and dysphagia persists. Indications, risks and benefits of procedure discussed in detail with patient. Patient verbalized understanding and is in agreement to proceed with EGD.   PLAN:  -chewing precautions, avoiding thicker/drier foods, sips of liquids between bites, chew thoroughly -schedule EGD, ASA III -Increase water intake, aim for atleast 64 oz per day Increase fruits, veggies and whole grains, kiwi and prunes are especially good for constipation -continue benefiber  All questions were answered, patient verbalized understanding and is in agreement with plan as outlined above.    Follow Up: 4 months   Laurelyn Terrero L. Geraldo Haris, MSN, APRN, AGNP-C Adult-Gerontology Nurse Practitioner [facility]

## 2024-05-01 ENCOUNTER — Encounter (INDEPENDENT_AMBULATORY_CARE_PROVIDER_SITE_OTHER): Payer: Self-pay

## 2024-05-01 DIAGNOSIS — M62521 Muscle wasting and atrophy, not elsewhere classified, right upper arm: Secondary | ICD-10-CM | POA: Diagnosis not present

## 2024-05-01 DIAGNOSIS — M62522 Muscle wasting and atrophy, not elsewhere classified, left upper arm: Secondary | ICD-10-CM | POA: Diagnosis not present

## 2024-05-01 DIAGNOSIS — M62561 Muscle wasting and atrophy, not elsewhere classified, right lower leg: Secondary | ICD-10-CM | POA: Diagnosis not present

## 2024-05-01 DIAGNOSIS — R1310 Dysphagia, unspecified: Secondary | ICD-10-CM | POA: Diagnosis not present

## 2024-05-01 DIAGNOSIS — R2689 Other abnormalities of gait and mobility: Secondary | ICD-10-CM | POA: Diagnosis not present

## 2024-05-01 DIAGNOSIS — R2681 Unsteadiness on feet: Secondary | ICD-10-CM | POA: Diagnosis not present

## 2024-05-01 DIAGNOSIS — M62562 Muscle wasting and atrophy, not elsewhere classified, left lower leg: Secondary | ICD-10-CM | POA: Diagnosis not present

## 2024-05-04 DIAGNOSIS — R2681 Unsteadiness on feet: Secondary | ICD-10-CM | POA: Diagnosis not present

## 2024-05-04 DIAGNOSIS — M62561 Muscle wasting and atrophy, not elsewhere classified, right lower leg: Secondary | ICD-10-CM | POA: Diagnosis not present

## 2024-05-04 DIAGNOSIS — M62522 Muscle wasting and atrophy, not elsewhere classified, left upper arm: Secondary | ICD-10-CM | POA: Diagnosis not present

## 2024-05-04 DIAGNOSIS — R2689 Other abnormalities of gait and mobility: Secondary | ICD-10-CM | POA: Diagnosis not present

## 2024-05-04 DIAGNOSIS — R1310 Dysphagia, unspecified: Secondary | ICD-10-CM | POA: Diagnosis not present

## 2024-05-04 DIAGNOSIS — M62521 Muscle wasting and atrophy, not elsewhere classified, right upper arm: Secondary | ICD-10-CM | POA: Diagnosis not present

## 2024-05-04 DIAGNOSIS — M62562 Muscle wasting and atrophy, not elsewhere classified, left lower leg: Secondary | ICD-10-CM | POA: Diagnosis not present

## 2024-05-05 DIAGNOSIS — R2681 Unsteadiness on feet: Secondary | ICD-10-CM | POA: Diagnosis not present

## 2024-05-05 DIAGNOSIS — R2689 Other abnormalities of gait and mobility: Secondary | ICD-10-CM | POA: Diagnosis not present

## 2024-05-05 DIAGNOSIS — M62561 Muscle wasting and atrophy, not elsewhere classified, right lower leg: Secondary | ICD-10-CM | POA: Diagnosis not present

## 2024-05-05 DIAGNOSIS — M62521 Muscle wasting and atrophy, not elsewhere classified, right upper arm: Secondary | ICD-10-CM | POA: Diagnosis not present

## 2024-05-05 DIAGNOSIS — M62562 Muscle wasting and atrophy, not elsewhere classified, left lower leg: Secondary | ICD-10-CM | POA: Diagnosis not present

## 2024-05-05 DIAGNOSIS — R1310 Dysphagia, unspecified: Secondary | ICD-10-CM | POA: Diagnosis not present

## 2024-05-05 DIAGNOSIS — M62522 Muscle wasting and atrophy, not elsewhere classified, left upper arm: Secondary | ICD-10-CM | POA: Diagnosis not present

## 2024-05-06 DIAGNOSIS — R2681 Unsteadiness on feet: Secondary | ICD-10-CM | POA: Diagnosis not present

## 2024-05-06 DIAGNOSIS — M62521 Muscle wasting and atrophy, not elsewhere classified, right upper arm: Secondary | ICD-10-CM | POA: Diagnosis not present

## 2024-05-06 DIAGNOSIS — M62562 Muscle wasting and atrophy, not elsewhere classified, left lower leg: Secondary | ICD-10-CM | POA: Diagnosis not present

## 2024-05-06 DIAGNOSIS — M62522 Muscle wasting and atrophy, not elsewhere classified, left upper arm: Secondary | ICD-10-CM | POA: Diagnosis not present

## 2024-05-06 DIAGNOSIS — R1310 Dysphagia, unspecified: Secondary | ICD-10-CM | POA: Diagnosis not present

## 2024-05-06 DIAGNOSIS — R2689 Other abnormalities of gait and mobility: Secondary | ICD-10-CM | POA: Diagnosis not present

## 2024-05-06 DIAGNOSIS — M62561 Muscle wasting and atrophy, not elsewhere classified, right lower leg: Secondary | ICD-10-CM | POA: Diagnosis not present

## 2024-05-07 DIAGNOSIS — M62522 Muscle wasting and atrophy, not elsewhere classified, left upper arm: Secondary | ICD-10-CM | POA: Diagnosis not present

## 2024-05-07 DIAGNOSIS — R1310 Dysphagia, unspecified: Secondary | ICD-10-CM | POA: Diagnosis not present

## 2024-05-07 DIAGNOSIS — R2681 Unsteadiness on feet: Secondary | ICD-10-CM | POA: Diagnosis not present

## 2024-05-07 DIAGNOSIS — M62521 Muscle wasting and atrophy, not elsewhere classified, right upper arm: Secondary | ICD-10-CM | POA: Diagnosis not present

## 2024-05-07 DIAGNOSIS — R2689 Other abnormalities of gait and mobility: Secondary | ICD-10-CM | POA: Diagnosis not present

## 2024-05-07 DIAGNOSIS — M62562 Muscle wasting and atrophy, not elsewhere classified, left lower leg: Secondary | ICD-10-CM | POA: Diagnosis not present

## 2024-05-07 DIAGNOSIS — M62561 Muscle wasting and atrophy, not elsewhere classified, right lower leg: Secondary | ICD-10-CM | POA: Diagnosis not present

## 2024-05-08 DIAGNOSIS — M62521 Muscle wasting and atrophy, not elsewhere classified, right upper arm: Secondary | ICD-10-CM | POA: Diagnosis not present

## 2024-05-08 DIAGNOSIS — M62522 Muscle wasting and atrophy, not elsewhere classified, left upper arm: Secondary | ICD-10-CM | POA: Diagnosis not present

## 2024-05-08 DIAGNOSIS — M62561 Muscle wasting and atrophy, not elsewhere classified, right lower leg: Secondary | ICD-10-CM | POA: Diagnosis not present

## 2024-05-08 DIAGNOSIS — R1310 Dysphagia, unspecified: Secondary | ICD-10-CM | POA: Diagnosis not present

## 2024-05-08 DIAGNOSIS — M62562 Muscle wasting and atrophy, not elsewhere classified, left lower leg: Secondary | ICD-10-CM | POA: Diagnosis not present

## 2024-05-08 DIAGNOSIS — R2689 Other abnormalities of gait and mobility: Secondary | ICD-10-CM | POA: Diagnosis not present

## 2024-05-08 DIAGNOSIS — R2681 Unsteadiness on feet: Secondary | ICD-10-CM | POA: Diagnosis not present

## 2024-05-11 DIAGNOSIS — M62561 Muscle wasting and atrophy, not elsewhere classified, right lower leg: Secondary | ICD-10-CM | POA: Diagnosis not present

## 2024-05-11 DIAGNOSIS — R2689 Other abnormalities of gait and mobility: Secondary | ICD-10-CM | POA: Diagnosis not present

## 2024-05-11 DIAGNOSIS — M62521 Muscle wasting and atrophy, not elsewhere classified, right upper arm: Secondary | ICD-10-CM | POA: Diagnosis not present

## 2024-05-11 DIAGNOSIS — R1310 Dysphagia, unspecified: Secondary | ICD-10-CM | POA: Diagnosis not present

## 2024-05-11 DIAGNOSIS — M62562 Muscle wasting and atrophy, not elsewhere classified, left lower leg: Secondary | ICD-10-CM | POA: Diagnosis not present

## 2024-05-11 DIAGNOSIS — R2681 Unsteadiness on feet: Secondary | ICD-10-CM | POA: Diagnosis not present

## 2024-05-11 DIAGNOSIS — M62522 Muscle wasting and atrophy, not elsewhere classified, left upper arm: Secondary | ICD-10-CM | POA: Diagnosis not present

## 2024-05-11 NOTE — Patient Instructions (Signed)
 Judith Hunt  05/11/2024     @PREFPERIOPPHARMACY @   Your procedure is scheduled on  05/14/2024.   Report to [facility] at  0900 A.M.   Call this number if you have problems the morning of surgery:  [phone]  If you experience any cold or flu symptoms such as cough, fever, chills, shortness of breath, etc. between now and your scheduled surgery, please notify us  at the above number.   Remember:  Follow the diet instructions given to you by the office.   You may drink clear liquids until  0700 am on 05/14/2024.    Clear liquids allowed are:                    Water, Juice (No red color; non-citric and without pulp; diabetics please choose diet or no sugar options), Carbonated beverages (diabetics please choose diet or no sugar options), Clear Tea (No creamer, milk, or cream, including half & half and powdered creamer), Black Coffee Only (No creamer, milk or cream, including half & half and powdered creamer), and Clear Sports drink (No red color; diabetics please choose diet or no sugar options)    Take these medicines the morning of surgery with A SIP OF WATER          duloxetine, gabapentin, metoprolol, oxycodone(if needed).    Do not wear jewelry, make-up or nail polish, including gel polish,  artificial nails, or any other type of covering on natural nails (fingers and  toes).  Do not wear lotions, powders, or perfumes, or deodorant.  Do not shave 48 hours prior to surgery.  Men may shave face and neck.  Do not bring valuables to the hospital.  [facility] is not responsible for any belongings or valuables.  Contacts, dentures or bridgework may not be worn into surgery.  Leave your suitcase in the car.  After surgery it may be brought to your room.  For patients admitted to the hospital, discharge time will be determined by your treatment team.  Patients discharged the day of surgery will not be allowed to drive home and must have someone with them for 24  hours.    Special instructions:   DO NOT smoke tobacco or vape for 24 hours before your procedure.  Please read over the following fact sheets that you were given. Anesthesia Post-op Instructions and Care and Recovery After Surgery      Upper Endoscopy, Adult, Care After After the procedure, it is common to have a sore throat. It is also common to have: Mild stomach pain or discomfort. Bloating. Nausea. Follow these instructions at home: The instructions below may help you care for yourself at home. Your health care provider may give you more instructions. If you have questions, ask your health care provider. If you were given a sedative during the procedure, it can affect you for several hours. Do not drive or operate machinery until your health care provider says that it is safe. If you will be going home right after the procedure, plan to have a responsible adult: Take you home from the hospital or clinic. You will not be allowed to drive. Care for you for the time you are told. Follow instructions from your health care provider about what you may eat and drink. Return to your normal activities as told by your health care provider. Ask your health care provider what activities are safe for you. Take over-the-counter and prescription medicines only  as told by your health care provider. Contact a health care provider if you: Have a sore throat that lasts longer than one day. Have trouble swallowing. Have a fever. Get help right away if you: Vomit blood or your vomit looks like coffee grounds. Have bloody, black, or tarry stools. Have a very bad sore throat or you cannot swallow. Have difficulty breathing or very bad pain in your chest or abdomen. These symptoms may be an emergency. Get help right away. Call 911. Do not wait to see if the symptoms will go away. Do not drive yourself to the hospital. Summary After the procedure, it is common to have a sore throat, mild stomach  discomfort, bloating, and nausea. If you were given a sedative during the procedure, it can affect you for several hours. Do not drive until your health care provider says that it is safe. Follow instructions from your health care provider about what you may eat and drink. Return to your normal activities as told by your health care provider. This information is not intended to replace advice given to you by your health care provider. Make sure you discuss any questions you have with your health care provider. Document Revised: 03/21/2022 Document Reviewed: 03/21/2022 Elsevier Patient Education  2024 Elsevier Inc.General Anesthesia, Adult, Care After The following information offers guidance on how to care for yourself after your procedure. Your health care provider may also give you more specific instructions. If you have problems or questions, contact your health care provider. What can I expect after the procedure? After the procedure, it is common for people to: Have pain or discomfort at the IV site. Have nausea or vomiting. Have a sore throat or hoarseness. Have trouble concentrating. Feel cold or chills. Feel weak, sleepy, or tired (fatigue). Have soreness and body aches. These can affect parts of the body that were not involved in surgery. Follow these instructions at home: For the time period you were told by your health care provider:  Rest. Do not participate in activities where you could fall or become injured. Do not drive or use machinery. Do not drink alcohol . Do not take sleeping pills or medicines that cause drowsiness. Do not make important decisions or sign legal documents. Do not take care of children on your own. General instructions Drink enough fluid to keep your urine pale yellow. If you have sleep apnea, surgery and certain medicines can increase your risk for breathing problems. Follow instructions from your health care provider about wearing your sleep  device: Anytime you are sleeping, including during daytime naps. While taking prescription pain medicines, sleeping medicines, or medicines that make you drowsy. Return to your normal activities as told by your health care provider. Ask your health care provider what activities are safe for you. Take over-the-counter and prescription medicines only as told by your health care provider. Do not use any products that contain nicotine or tobacco. These products include cigarettes, chewing tobacco, and vaping devices, such as e-cigarettes. These can delay incision healing after surgery. If you need help quitting, ask your health care provider. Contact a health care provider if: You have nausea or vomiting that does not get better with medicine. You vomit every time you eat or drink. You have pain that does not get better with medicine. You cannot urinate or have bloody urine. You develop a skin rash. You have a fever. Get help right away if: You have trouble breathing. You have chest pain. You vomit blood. These symptoms may  be an emergency. Get help right away. Call 911. Do not wait to see if the symptoms will go away. Do not drive yourself to the hospital. Summary After the procedure, it is common to have a sore throat, hoarseness, nausea, vomiting, or to feel weak, sleepy, or fatigue. For the time period you were told by your health care provider, do not drive or use machinery. Get help right away if you have difficulty breathing, have chest pain, or vomit blood. These symptoms may be an emergency. This information is not intended to replace advice given to you by your health care provider. Make sure you discuss any questions you have with your health care provider. Document Revised: 03/09/2022 Document Reviewed: 03/09/2022 Elsevier Patient Education  2024 [employer].

## 2024-05-12 ENCOUNTER — Encounter (HOSPITAL_COMMUNITY)
Admission: RE | Admit: 2024-05-12 | Discharge: 2024-05-12 | Disposition: A | Discharge status: Home / Self Care | Source: Ambulatory Visit | Attending: Gastroenterology | Admitting: Gastroenterology

## 2024-05-12 ENCOUNTER — Encounter (HOSPITAL_COMMUNITY): Payer: Self-pay

## 2024-05-12 DIAGNOSIS — Z01812 Encounter for preprocedural laboratory examination: Secondary | ICD-10-CM | POA: Diagnosis present

## 2024-05-12 DIAGNOSIS — I10 Essential (primary) hypertension: Secondary | ICD-10-CM | POA: Diagnosis not present

## 2024-05-12 DIAGNOSIS — R9431 Abnormal electrocardiogram [ECG] [EKG]: Secondary | ICD-10-CM | POA: Diagnosis not present

## 2024-05-12 DIAGNOSIS — D509 Iron deficiency anemia, unspecified: Secondary | ICD-10-CM | POA: Diagnosis not present

## 2024-05-12 DIAGNOSIS — Z01810 Encounter for preprocedural cardiovascular examination: Secondary | ICD-10-CM | POA: Diagnosis present

## 2024-05-12 DIAGNOSIS — Z01818 Encounter for other preprocedural examination: Secondary | ICD-10-CM | POA: Diagnosis not present

## 2024-05-12 HISTORY — DX: Anxiety disorder, unspecified: F41.9

## 2024-05-12 HISTORY — DX: Unspecified osteoarthritis, unspecified site: M19.90

## 2024-05-12 HISTORY — DX: Gastro-esophageal reflux disease without esophagitis: K21.9

## 2024-05-12 HISTORY — DX: Chronic obstructive pulmonary disease, unspecified: J44.9

## 2024-05-12 HISTORY — DX: Cerebral infarction, unspecified: I63.9

## 2024-05-12 HISTORY — DX: Depression, unspecified: F32.A

## 2024-05-12 HISTORY — DX: Essential (primary) hypertension: I10

## 2024-05-12 HISTORY — DX: Influenza due to unidentified influenza virus with other respiratory manifestations: J11.1

## 2024-05-12 LAB — CBC WITH DIFFERENTIAL/PLATELET
Abs Immature Granulocytes: 0.02 10*3/uL (ref 0.00–0.07)
Basophils Absolute: 0 10*3/uL (ref 0.0–0.1)
Basophils Relative: 0 %
Eosinophils Absolute: 0.3 10*3/uL (ref 0.0–0.5)
Eosinophils Relative: 4 %
HCT: 41.5 % (ref 36.0–46.0)
Hemoglobin: 13.2 g/dL (ref 12.0–15.0)
Immature Granulocytes: 0 %
Lymphocytes Relative: 15 %
Lymphs Abs: 1.1 10*3/uL (ref 0.7–4.0)
MCH: 28.6 pg (ref 26.0–34.0)
MCHC: 31.8 g/dL (ref 30.0–36.0)
MCV: 90 fL (ref 80.0–100.0)
Monocytes Absolute: 0.9 10*3/uL (ref 0.1–1.0)
Monocytes Relative: 12 %
Neutro Abs: 5.1 10*3/uL (ref 1.7–7.7)
Neutrophils Relative %: 69 %
Platelets: 203 10*3/uL (ref 150–400)
RBC: 4.61 MIL/uL (ref 3.87–5.11)
RDW: 14 % (ref 11.5–15.5)
WBC: 7.5 10*3/uL (ref 4.0–10.5)
nRBC: 0 % (ref 0.0–0.2)

## 2024-05-13 ENCOUNTER — Ambulatory Visit (INDEPENDENT_AMBULATORY_CARE_PROVIDER_SITE_OTHER): Payer: Self-pay | Admitting: Gastroenterology

## 2024-05-13 DIAGNOSIS — M62522 Muscle wasting and atrophy, not elsewhere classified, left upper arm: Secondary | ICD-10-CM | POA: Diagnosis not present

## 2024-05-13 DIAGNOSIS — R1310 Dysphagia, unspecified: Secondary | ICD-10-CM | POA: Diagnosis not present

## 2024-05-13 DIAGNOSIS — R2689 Other abnormalities of gait and mobility: Secondary | ICD-10-CM | POA: Diagnosis not present

## 2024-05-13 DIAGNOSIS — R2681 Unsteadiness on feet: Secondary | ICD-10-CM | POA: Diagnosis not present

## 2024-05-13 DIAGNOSIS — M62521 Muscle wasting and atrophy, not elsewhere classified, right upper arm: Secondary | ICD-10-CM | POA: Diagnosis not present

## 2024-05-13 DIAGNOSIS — M62561 Muscle wasting and atrophy, not elsewhere classified, right lower leg: Secondary | ICD-10-CM | POA: Diagnosis not present

## 2024-05-13 DIAGNOSIS — M62562 Muscle wasting and atrophy, not elsewhere classified, left lower leg: Secondary | ICD-10-CM | POA: Diagnosis not present

## 2024-05-14 ENCOUNTER — Encounter (HOSPITAL_COMMUNITY): Payer: Self-pay | Admitting: Gastroenterology

## 2024-05-14 ENCOUNTER — Ambulatory Visit (HOSPITAL_BASED_OUTPATIENT_CLINIC_OR_DEPARTMENT_OTHER)

## 2024-05-14 ENCOUNTER — Encounter (HOSPITAL_COMMUNITY)
Admission: RE | Disposition: A | Discharge status: Home / Self Care | Payer: Self-pay | Source: Home / Self Care | Attending: Gastroenterology

## 2024-05-14 ENCOUNTER — Ambulatory Visit (HOSPITAL_COMMUNITY)

## 2024-05-14 ENCOUNTER — Ambulatory Visit (HOSPITAL_COMMUNITY)
Admission: RE | Admit: 2024-05-14 | Discharge: 2024-05-14 | Disposition: A | Discharge status: Home / Self Care | Attending: Gastroenterology | Admitting: Gastroenterology

## 2024-05-14 DIAGNOSIS — K222 Esophageal obstruction: Secondary | ICD-10-CM | POA: Insufficient documentation

## 2024-05-14 DIAGNOSIS — M1990 Unspecified osteoarthritis, unspecified site: Secondary | ICD-10-CM | POA: Diagnosis not present

## 2024-05-14 DIAGNOSIS — R1310 Dysphagia, unspecified: Secondary | ICD-10-CM | POA: Diagnosis not present

## 2024-05-14 DIAGNOSIS — I10 Essential (primary) hypertension: Secondary | ICD-10-CM | POA: Insufficient documentation

## 2024-05-14 DIAGNOSIS — K269 Duodenal ulcer, unspecified as acute or chronic, without hemorrhage or perforation: Secondary | ICD-10-CM | POA: Insufficient documentation

## 2024-05-14 DIAGNOSIS — F32A Depression, unspecified: Secondary | ICD-10-CM | POA: Diagnosis not present

## 2024-05-14 DIAGNOSIS — D509 Iron deficiency anemia, unspecified: Secondary | ICD-10-CM

## 2024-05-14 DIAGNOSIS — Z87891 Personal history of nicotine dependence: Secondary | ICD-10-CM

## 2024-05-14 DIAGNOSIS — M62522 Muscle wasting and atrophy, not elsewhere classified, left upper arm: Secondary | ICD-10-CM | POA: Diagnosis not present

## 2024-05-14 DIAGNOSIS — M62561 Muscle wasting and atrophy, not elsewhere classified, right lower leg: Secondary | ICD-10-CM | POA: Diagnosis not present

## 2024-05-14 DIAGNOSIS — K319 Disease of stomach and duodenum, unspecified: Secondary | ICD-10-CM | POA: Insufficient documentation

## 2024-05-14 DIAGNOSIS — K219 Gastro-esophageal reflux disease without esophagitis: Secondary | ICD-10-CM | POA: Insufficient documentation

## 2024-05-14 DIAGNOSIS — M62521 Muscle wasting and atrophy, not elsewhere classified, right upper arm: Secondary | ICD-10-CM | POA: Diagnosis not present

## 2024-05-14 DIAGNOSIS — R2681 Unsteadiness on feet: Secondary | ICD-10-CM | POA: Diagnosis not present

## 2024-05-14 DIAGNOSIS — K2970 Gastritis, unspecified, without bleeding: Secondary | ICD-10-CM | POA: Diagnosis not present

## 2024-05-14 DIAGNOSIS — M62562 Muscle wasting and atrophy, not elsewhere classified, left lower leg: Secondary | ICD-10-CM | POA: Diagnosis not present

## 2024-05-14 DIAGNOSIS — Z981 Arthrodesis status: Secondary | ICD-10-CM | POA: Insufficient documentation

## 2024-05-14 DIAGNOSIS — K3189 Other diseases of stomach and duodenum: Secondary | ICD-10-CM | POA: Insufficient documentation

## 2024-05-14 DIAGNOSIS — F419 Anxiety disorder, unspecified: Secondary | ICD-10-CM | POA: Diagnosis not present

## 2024-05-14 DIAGNOSIS — R1314 Dysphagia, pharyngoesophageal phase: Secondary | ICD-10-CM | POA: Diagnosis present

## 2024-05-14 DIAGNOSIS — J449 Chronic obstructive pulmonary disease, unspecified: Secondary | ICD-10-CM | POA: Diagnosis not present

## 2024-05-14 DIAGNOSIS — R2689 Other abnormalities of gait and mobility: Secondary | ICD-10-CM | POA: Diagnosis not present

## 2024-05-14 HISTORY — PX: ESOPHAGOGASTRODUODENOSCOPY: SHX5428

## 2024-05-14 LAB — KOH PREP: KOH Prep: NONE SEEN

## 2024-05-14 SURGERY — EGD (ESOPHAGOGASTRODUODENOSCOPY)
Anesthesia: General

## 2024-05-14 MED ORDER — LACTATED RINGERS IV SOLN: INTRAVENOUS | Status: DC | PRN

## 2024-05-14 MED ORDER — PROPOFOL 500 MG/50ML IV EMUL
INTRAVENOUS | Status: DC | PRN
  Administered 2024-05-14: 150 ug/kg/min via INTRAVENOUS

## 2024-05-14 MED ORDER — PROPOFOL 10 MG/ML IV BOLUS
INTRAVENOUS | Status: DC | PRN
Start: 2024-05-14 — End: 2024-05-14
  Administered 2024-05-14: 100 mg via INTRAVENOUS

## 2024-05-14 MED ORDER — LIDOCAINE 2% (20 MG/ML) 5 ML SYRINGE
INTRAMUSCULAR | Status: DC | PRN
  Administered 2024-05-14: 100 mg via INTRAVENOUS

## 2024-05-14 NOTE — Anesthesia Procedure Notes (Signed)
 Date/Time: 05/14/2024 10:32 AM  Performed by: Sherwin Donate, CRNAPre-anesthesia Checklist: Patient identified, Emergency Drugs available, Suction available and Patient being monitored Patient Re-evaluated:Patient Re-evaluated prior to induction Oxygen Delivery Method: Nasal cannula Induction Type: IV induction Placement Confirmation: positive ETCO2 Comments: Optiflow High Flow Newman O2 used.

## 2024-05-14 NOTE — Anesthesia Preprocedure Evaluation (Addendum)
 Anesthesia Evaluation  Patient identified by MRN, date of birth, ID band Patient awake    Reviewed: Allergy & Precautions, H&P , NPO status , Patient's Chart, lab work & pertinent test results  Airway Mallampati: II  TM Distance: >3 FB Neck ROM: Limited    Dental no notable dental hx.    Pulmonary COPD, former smoker   Pulmonary exam normal breath sounds clear to auscultation       Cardiovascular hypertension, Normal cardiovascular exam Rhythm:Regular Rate:Normal     Neuro/Psych  PSYCHIATRIC DISORDERS Anxiety Depression       GI/Hepatic Neg liver ROS,GERD  ,,  Endo/Other  negative endocrine ROS    Renal/GU negative Renal ROS  negative genitourinary   Musculoskeletal  (+) Arthritis ,    Abdominal   Peds negative pediatric ROS (+)  Hematology  (+) Blood dyscrasia, anemia   Anesthesia Other Findings   Reproductive/Obstetrics negative OB ROS                             Anesthesia Physical Anesthesia Plan  ASA: 3  Anesthesia Plan: General   Post-op Pain Management:    Induction: Intravenous  PONV Risk Score and Plan:   Airway Management Planned: Nasal Cannula  Additional Equipment:   Intra-op Plan:   Post-operative Plan:   Informed Consent: I have reviewed the patients History and Physical, chart, labs and discussed the procedure including the risks, benefits and alternatives for the proposed anesthesia with the patient or authorized representative who has indicated his/her understanding and acceptance.     Dental advisory given  Plan Discussed with: CRNA  Anesthesia Plan Comments:        Anesthesia Quick Evaluation

## 2024-05-14 NOTE — Discharge Instructions (Signed)

## 2024-05-14 NOTE — Transfer of Care (Signed)
 Immediate Anesthesia Transfer of Care Note  Patient: Judith Hunt  Procedure(s) Performed: EGD (ESOPHAGOGASTRODUODENOSCOPY)  Patient Location: Short Stay  Anesthesia Type:General  Level of Consciousness: drowsy  Airway & Oxygen Therapy: Patient Spontanous Breathing and Patient connected to face mask oxygen  Post-op Assessment: Report given to RN and Post -op Vital signs reviewed and stable  Post vital signs: Reviewed and stable  Last Vitals:  Vitals Value Taken Time  BP    Temp    Pulse    Resp    SpO2      Last Pain:  Vitals:   05/14/24 1033  PainSc: 6       Patients Stated Pain Goal: 5 (05/14/24 0950)  Complications: No notable events documented.

## 2024-05-14 NOTE — Interval H&P Note (Signed)
 History and Physical Interval Note:  05/14/2024 9:45 AM  Judith Hunt  has presented today for surgery, with the diagnosis of DYSPHAGIA.  The various methods of treatment have been discussed with the patient and family. After consideration of risks, benefits and other options for treatment, the patient has consented to  Procedure(s) with comments: EGD (ESOPHAGOGASTRODUODENOSCOPY) (N/A) - 11:00AM;ASA 3 as a surgical intervention.  The patient's history has been reviewed, patient examined, no change in status, stable for surgery.  I have reviewed the patient's chart and labs.  Questions were answered to the patient's satisfaction.     [name]

## 2024-05-14 NOTE — Op Note (Signed)
 [facility] Patient Name: Judith Hunt Procedure Date: 05/14/2024 10:11 AM MRN: [id number] Date of Birth: Mar 23, 1956 Attending MD: Terril Fetters , MD, [id number] CSN: [id number] Age: 68 Admit Type: Outpatient Procedure:                Upper GI endoscopy Indications:              Esophageal dysphagia Providers:                Terril Fetters, MD, Graydon Lazier RN, RN, Sharlette Dayhoff                            Technician, Technician Referring MD:              Medicines:                Monitored Anesthesia Care Complications:            No immediate complications. Estimated Blood Loss:     Estimated blood loss was minimal. Procedure:                Pre-Anesthesia Assessment:                           - Prior to the procedure, a History and Physical                            was performed, and patient medications and                            allergies were reviewed. The patient's tolerance of                            previous anesthesia was also reviewed. The risks                            and benefits of the procedure and the sedation                            options and risks were discussed with the patient.                            All questions were answered, and informed consent                            was obtained. Prior Anticoagulants: The patient has                            taken no anticoagulant or antiplatelet agents. ASA                            Grade Assessment: III - A patient with severe                            systemic disease. After reviewing the risks and  benefits, the patient was deemed in satisfactory                            condition to undergo the procedure.                           After obtaining informed consent, the endoscope was                            passed under direct vision. Throughout the                            procedure, the patient's blood pressure, pulse, and                            oxygen  saturations were monitored continuously. The                            GIF-H190 ([id number]) scope was introduced through the                            mouth, and advanced to the second part of duodenum.                            The upper GI endoscopy was accomplished without                            difficulty. The patient tolerated the procedure                            well. Scope In: 10:39:50 AM Scope Out: 10:50:28 AM Total Procedure Duration: 0 hours 10 minutes 38 seconds  Findings:      One benign-appearing, intrinsic moderate stenosis was found in the lower       third of the esophagus. The stenosis was traversed. A TTS dilator was       passed through the scope. Dilation with a 15-16.5-18 mm balloon dilator       was performed to 18 mm. The dilation site was examined and showed       complete resolution of luminal narrowing. Biopsies were taken with a       cold forceps for histology.      , white plaques were found in the lower third of the esophagus.       Brushings for KOH prep were obtained in the upper third of the esophagus.      Mild inflammation characterized by erosions was found in the stomach.       Biopsies were taken with a cold forceps for histology.      Mucosal changes characterized by scarring were found in the gastric       antrum. Biopsies were taken with a cold forceps for histology.      One non-bleeding superficial duodenal ulcer with a clean ulcer base       (Forrest Class III) was found in the duodenal bulb. The lesion was 5 mm       in largest dimension. Impression:               -  Benign-appearing esophageal stenosis. Dilated.                            Biopsied.                           - Esophageal plaques were found, suspicious for                            candidiasis. Brushings performed.                           - Gastritis. Biopsied.                           - Scarred mucosa in the antrum. Biopsied.                           -  Non-bleeding duodenal ulcer with a clean ulcer                            base (Forrest Class III). Moderate Sedation:      Per Anesthesia Care Recommendation:           - Patient has a contact number available for                            emergencies. The signs and symptoms of potential                            delayed complications were discussed with the                            patient. Return to normal activities tomorrow.                            Written discharge instructions were provided to the                            patient.                           - Resume previous diet.                           - Continue present medications.                           - Await pathology results.                           - Repeat upper endoscopy PRN.                           - Return to GI clinic as previously scheduled.                           -avoid NSAIDs, patient has been  taking Ibuprofen                           -Protonix 40mg  daily, before breakfast Procedure Code(s):        --- Professional ---                           [phone], Esophagogastroduodenoscopy, flexible,                            transoral; with transendoscopic balloon dilation of                            esophagus (less than 30 mm diameter)                           43239, 59, Esophagogastroduodenoscopy, flexible,                            transoral; with biopsy, single or multiple Diagnosis Code(s):        --- Professional ---                           K22.2, Esophageal obstruction                           K22.9, Disease of esophagus, unspecified                           K29.70, Gastritis, unspecified, without bleeding                           K31.89, Other diseases of stomach and duodenum                           K26.9, Duodenal ulcer, unspecified as acute or                            chronic, without hemorrhage or perforation                           R13.14, Dysphagia, pharyngoesophageal  phase CPT copyright 2022 American [employer] Association. All rights reserved. The codes documented in this report are preliminary and upon coder review may  be revised to meet current compliance requirements. Terril Fetters, MD Terril Fetters, MD 05/14/2024 10:57:45 AM This report has been signed electronically. Number of Addenda: 0

## 2024-05-14 NOTE — Anesthesia Postprocedure Evaluation (Signed)
 Anesthesia Post Note  Patient: Judith Hunt  Procedure(s) Performed: EGD (ESOPHAGOGASTRODUODENOSCOPY)  Patient location during evaluation: PACU Anesthesia Type: General Level of consciousness: awake and alert Pain management: pain level controlled Vital Signs Assessment: post-procedure vital signs reviewed and stable Respiratory status: spontaneous breathing, nonlabored ventilation, respiratory function stable and patient connected to nasal cannula oxygen Cardiovascular status: blood pressure returned to baseline and stable Postop Assessment: no apparent nausea or vomiting Anesthetic complications: no  No notable events documented.   Last Vitals:  Vitals:   05/14/24 0957 05/14/24 1057  BP: (!) 146/70 (!) 144/69  Pulse:  66  Resp: 12 16  Temp: 36.9 C 36.9 C  SpO2: 97% 100%    Last Pain:  Vitals:   05/14/24 1057  TempSrc: Oral  PainSc: 0-No pain                 Beacher Limerick

## 2024-05-15 ENCOUNTER — Encounter (HOSPITAL_COMMUNITY): Payer: Self-pay | Admitting: Gastroenterology

## 2024-05-15 DIAGNOSIS — M62521 Muscle wasting and atrophy, not elsewhere classified, right upper arm: Secondary | ICD-10-CM | POA: Diagnosis not present

## 2024-05-15 DIAGNOSIS — M62561 Muscle wasting and atrophy, not elsewhere classified, right lower leg: Secondary | ICD-10-CM | POA: Diagnosis not present

## 2024-05-15 DIAGNOSIS — R1310 Dysphagia, unspecified: Secondary | ICD-10-CM | POA: Diagnosis not present

## 2024-05-15 DIAGNOSIS — M62562 Muscle wasting and atrophy, not elsewhere classified, left lower leg: Secondary | ICD-10-CM | POA: Diagnosis not present

## 2024-05-15 DIAGNOSIS — R2689 Other abnormalities of gait and mobility: Secondary | ICD-10-CM | POA: Diagnosis not present

## 2024-05-15 DIAGNOSIS — R2681 Unsteadiness on feet: Secondary | ICD-10-CM | POA: Diagnosis not present

## 2024-05-15 DIAGNOSIS — M62522 Muscle wasting and atrophy, not elsewhere classified, left upper arm: Secondary | ICD-10-CM | POA: Diagnosis not present

## 2024-05-15 LAB — SURGICAL PATHOLOGY

## 2024-05-16 DIAGNOSIS — R2689 Other abnormalities of gait and mobility: Secondary | ICD-10-CM | POA: Diagnosis not present

## 2024-05-16 DIAGNOSIS — M62561 Muscle wasting and atrophy, not elsewhere classified, right lower leg: Secondary | ICD-10-CM | POA: Diagnosis not present

## 2024-05-16 DIAGNOSIS — R1310 Dysphagia, unspecified: Secondary | ICD-10-CM | POA: Diagnosis not present

## 2024-05-16 DIAGNOSIS — M62562 Muscle wasting and atrophy, not elsewhere classified, left lower leg: Secondary | ICD-10-CM | POA: Diagnosis not present

## 2024-05-16 DIAGNOSIS — M62522 Muscle wasting and atrophy, not elsewhere classified, left upper arm: Secondary | ICD-10-CM | POA: Diagnosis not present

## 2024-05-16 DIAGNOSIS — M62521 Muscle wasting and atrophy, not elsewhere classified, right upper arm: Secondary | ICD-10-CM | POA: Diagnosis not present

## 2024-05-16 DIAGNOSIS — R2681 Unsteadiness on feet: Secondary | ICD-10-CM | POA: Diagnosis not present

## 2024-05-18 DIAGNOSIS — R1310 Dysphagia, unspecified: Secondary | ICD-10-CM | POA: Diagnosis not present

## 2024-05-18 DIAGNOSIS — M62522 Muscle wasting and atrophy, not elsewhere classified, left upper arm: Secondary | ICD-10-CM | POA: Diagnosis not present

## 2024-05-18 DIAGNOSIS — M62562 Muscle wasting and atrophy, not elsewhere classified, left lower leg: Secondary | ICD-10-CM | POA: Diagnosis not present

## 2024-05-18 DIAGNOSIS — M62521 Muscle wasting and atrophy, not elsewhere classified, right upper arm: Secondary | ICD-10-CM | POA: Diagnosis not present

## 2024-05-18 DIAGNOSIS — R2689 Other abnormalities of gait and mobility: Secondary | ICD-10-CM | POA: Diagnosis not present

## 2024-05-18 DIAGNOSIS — R2681 Unsteadiness on feet: Secondary | ICD-10-CM | POA: Diagnosis not present

## 2024-05-18 DIAGNOSIS — M62561 Muscle wasting and atrophy, not elsewhere classified, right lower leg: Secondary | ICD-10-CM | POA: Diagnosis not present

## 2024-05-19 DIAGNOSIS — M62521 Muscle wasting and atrophy, not elsewhere classified, right upper arm: Secondary | ICD-10-CM | POA: Diagnosis not present

## 2024-05-19 DIAGNOSIS — R2681 Unsteadiness on feet: Secondary | ICD-10-CM | POA: Diagnosis not present

## 2024-05-19 DIAGNOSIS — R2689 Other abnormalities of gait and mobility: Secondary | ICD-10-CM | POA: Diagnosis not present

## 2024-05-19 DIAGNOSIS — M62561 Muscle wasting and atrophy, not elsewhere classified, right lower leg: Secondary | ICD-10-CM | POA: Diagnosis not present

## 2024-05-19 DIAGNOSIS — R1310 Dysphagia, unspecified: Secondary | ICD-10-CM | POA: Diagnosis not present

## 2024-05-19 DIAGNOSIS — M62522 Muscle wasting and atrophy, not elsewhere classified, left upper arm: Secondary | ICD-10-CM | POA: Diagnosis not present

## 2024-05-19 DIAGNOSIS — M62562 Muscle wasting and atrophy, not elsewhere classified, left lower leg: Secondary | ICD-10-CM | POA: Diagnosis not present

## 2024-05-20 DIAGNOSIS — R1310 Dysphagia, unspecified: Secondary | ICD-10-CM | POA: Diagnosis not present

## 2024-05-20 DIAGNOSIS — R2681 Unsteadiness on feet: Secondary | ICD-10-CM | POA: Diagnosis not present

## 2024-05-20 DIAGNOSIS — R2689 Other abnormalities of gait and mobility: Secondary | ICD-10-CM | POA: Diagnosis not present

## 2024-05-20 DIAGNOSIS — M62521 Muscle wasting and atrophy, not elsewhere classified, right upper arm: Secondary | ICD-10-CM | POA: Diagnosis not present

## 2024-05-20 DIAGNOSIS — M62562 Muscle wasting and atrophy, not elsewhere classified, left lower leg: Secondary | ICD-10-CM | POA: Diagnosis not present

## 2024-05-20 DIAGNOSIS — M62522 Muscle wasting and atrophy, not elsewhere classified, left upper arm: Secondary | ICD-10-CM | POA: Diagnosis not present

## 2024-05-20 DIAGNOSIS — M62561 Muscle wasting and atrophy, not elsewhere classified, right lower leg: Secondary | ICD-10-CM | POA: Diagnosis not present

## 2024-05-21 ENCOUNTER — Ambulatory Visit (INDEPENDENT_AMBULATORY_CARE_PROVIDER_SITE_OTHER): Payer: Self-pay | Admitting: Gastroenterology

## 2024-05-21 DIAGNOSIS — R1310 Dysphagia, unspecified: Secondary | ICD-10-CM | POA: Diagnosis not present

## 2024-05-21 DIAGNOSIS — R2689 Other abnormalities of gait and mobility: Secondary | ICD-10-CM | POA: Diagnosis not present

## 2024-05-21 DIAGNOSIS — M62521 Muscle wasting and atrophy, not elsewhere classified, right upper arm: Secondary | ICD-10-CM | POA: Diagnosis not present

## 2024-05-21 DIAGNOSIS — M62562 Muscle wasting and atrophy, not elsewhere classified, left lower leg: Secondary | ICD-10-CM | POA: Diagnosis not present

## 2024-05-21 DIAGNOSIS — M62522 Muscle wasting and atrophy, not elsewhere classified, left upper arm: Secondary | ICD-10-CM | POA: Diagnosis not present

## 2024-05-21 DIAGNOSIS — M62561 Muscle wasting and atrophy, not elsewhere classified, right lower leg: Secondary | ICD-10-CM | POA: Diagnosis not present

## 2024-05-21 DIAGNOSIS — R2681 Unsteadiness on feet: Secondary | ICD-10-CM | POA: Diagnosis not present

## 2024-05-22 DIAGNOSIS — M62561 Muscle wasting and atrophy, not elsewhere classified, right lower leg: Secondary | ICD-10-CM | POA: Diagnosis not present

## 2024-05-22 DIAGNOSIS — R2689 Other abnormalities of gait and mobility: Secondary | ICD-10-CM | POA: Diagnosis not present

## 2024-05-22 DIAGNOSIS — M62562 Muscle wasting and atrophy, not elsewhere classified, left lower leg: Secondary | ICD-10-CM | POA: Diagnosis not present

## 2024-05-22 DIAGNOSIS — M62521 Muscle wasting and atrophy, not elsewhere classified, right upper arm: Secondary | ICD-10-CM | POA: Diagnosis not present

## 2024-05-22 DIAGNOSIS — R2681 Unsteadiness on feet: Secondary | ICD-10-CM | POA: Diagnosis not present

## 2024-05-22 DIAGNOSIS — M62522 Muscle wasting and atrophy, not elsewhere classified, left upper arm: Secondary | ICD-10-CM | POA: Diagnosis not present

## 2024-05-22 DIAGNOSIS — R1310 Dysphagia, unspecified: Secondary | ICD-10-CM | POA: Diagnosis not present

## 2024-05-25 DIAGNOSIS — M62522 Muscle wasting and atrophy, not elsewhere classified, left upper arm: Secondary | ICD-10-CM | POA: Diagnosis not present

## 2024-05-25 DIAGNOSIS — M62521 Muscle wasting and atrophy, not elsewhere classified, right upper arm: Secondary | ICD-10-CM | POA: Diagnosis not present

## 2024-05-25 DIAGNOSIS — R2689 Other abnormalities of gait and mobility: Secondary | ICD-10-CM | POA: Diagnosis not present

## 2024-05-25 DIAGNOSIS — M62562 Muscle wasting and atrophy, not elsewhere classified, left lower leg: Secondary | ICD-10-CM | POA: Diagnosis not present

## 2024-05-25 DIAGNOSIS — R1310 Dysphagia, unspecified: Secondary | ICD-10-CM | POA: Diagnosis not present

## 2024-05-25 DIAGNOSIS — M62561 Muscle wasting and atrophy, not elsewhere classified, right lower leg: Secondary | ICD-10-CM | POA: Diagnosis not present

## 2024-05-26 DIAGNOSIS — M62561 Muscle wasting and atrophy, not elsewhere classified, right lower leg: Secondary | ICD-10-CM | POA: Diagnosis not present

## 2024-05-26 DIAGNOSIS — M62522 Muscle wasting and atrophy, not elsewhere classified, left upper arm: Secondary | ICD-10-CM | POA: Diagnosis not present

## 2024-05-26 DIAGNOSIS — R2689 Other abnormalities of gait and mobility: Secondary | ICD-10-CM | POA: Diagnosis not present

## 2024-05-26 DIAGNOSIS — R1310 Dysphagia, unspecified: Secondary | ICD-10-CM | POA: Diagnosis not present

## 2024-05-26 DIAGNOSIS — M62562 Muscle wasting and atrophy, not elsewhere classified, left lower leg: Secondary | ICD-10-CM | POA: Diagnosis not present

## 2024-05-26 DIAGNOSIS — M62521 Muscle wasting and atrophy, not elsewhere classified, right upper arm: Secondary | ICD-10-CM | POA: Diagnosis not present

## 2024-05-27 DIAGNOSIS — M62561 Muscle wasting and atrophy, not elsewhere classified, right lower leg: Secondary | ICD-10-CM | POA: Diagnosis not present

## 2024-05-27 DIAGNOSIS — M62521 Muscle wasting and atrophy, not elsewhere classified, right upper arm: Secondary | ICD-10-CM | POA: Diagnosis not present

## 2024-05-27 DIAGNOSIS — M62522 Muscle wasting and atrophy, not elsewhere classified, left upper arm: Secondary | ICD-10-CM | POA: Diagnosis not present

## 2024-05-27 DIAGNOSIS — M62562 Muscle wasting and atrophy, not elsewhere classified, left lower leg: Secondary | ICD-10-CM | POA: Diagnosis not present

## 2024-05-27 DIAGNOSIS — R1310 Dysphagia, unspecified: Secondary | ICD-10-CM | POA: Diagnosis not present

## 2024-05-27 DIAGNOSIS — R2689 Other abnormalities of gait and mobility: Secondary | ICD-10-CM | POA: Diagnosis not present

## 2024-05-28 DIAGNOSIS — R1310 Dysphagia, unspecified: Secondary | ICD-10-CM | POA: Diagnosis not present

## 2024-05-28 DIAGNOSIS — M62562 Muscle wasting and atrophy, not elsewhere classified, left lower leg: Secondary | ICD-10-CM | POA: Diagnosis not present

## 2024-05-28 DIAGNOSIS — M62521 Muscle wasting and atrophy, not elsewhere classified, right upper arm: Secondary | ICD-10-CM | POA: Diagnosis not present

## 2024-05-28 DIAGNOSIS — M62522 Muscle wasting and atrophy, not elsewhere classified, left upper arm: Secondary | ICD-10-CM | POA: Diagnosis not present

## 2024-05-28 DIAGNOSIS — M62561 Muscle wasting and atrophy, not elsewhere classified, right lower leg: Secondary | ICD-10-CM | POA: Diagnosis not present

## 2024-05-28 DIAGNOSIS — R2689 Other abnormalities of gait and mobility: Secondary | ICD-10-CM | POA: Diagnosis not present

## 2024-05-29 DIAGNOSIS — M62562 Muscle wasting and atrophy, not elsewhere classified, left lower leg: Secondary | ICD-10-CM | POA: Diagnosis not present

## 2024-05-29 DIAGNOSIS — M62521 Muscle wasting and atrophy, not elsewhere classified, right upper arm: Secondary | ICD-10-CM | POA: Diagnosis not present

## 2024-05-29 DIAGNOSIS — M62522 Muscle wasting and atrophy, not elsewhere classified, left upper arm: Secondary | ICD-10-CM | POA: Diagnosis not present

## 2024-05-29 DIAGNOSIS — R2689 Other abnormalities of gait and mobility: Secondary | ICD-10-CM | POA: Diagnosis not present

## 2024-05-29 DIAGNOSIS — M62561 Muscle wasting and atrophy, not elsewhere classified, right lower leg: Secondary | ICD-10-CM | POA: Diagnosis not present

## 2024-05-29 DIAGNOSIS — R1310 Dysphagia, unspecified: Secondary | ICD-10-CM | POA: Diagnosis not present

## 2024-06-01 DIAGNOSIS — M62522 Muscle wasting and atrophy, not elsewhere classified, left upper arm: Secondary | ICD-10-CM | POA: Diagnosis not present

## 2024-06-01 DIAGNOSIS — M62561 Muscle wasting and atrophy, not elsewhere classified, right lower leg: Secondary | ICD-10-CM | POA: Diagnosis not present

## 2024-06-01 DIAGNOSIS — R2689 Other abnormalities of gait and mobility: Secondary | ICD-10-CM | POA: Diagnosis not present

## 2024-06-01 DIAGNOSIS — M62521 Muscle wasting and atrophy, not elsewhere classified, right upper arm: Secondary | ICD-10-CM | POA: Diagnosis not present

## 2024-06-01 DIAGNOSIS — M62562 Muscle wasting and atrophy, not elsewhere classified, left lower leg: Secondary | ICD-10-CM | POA: Diagnosis not present

## 2024-06-01 DIAGNOSIS — R1310 Dysphagia, unspecified: Secondary | ICD-10-CM | POA: Diagnosis not present

## 2024-06-02 DIAGNOSIS — R1310 Dysphagia, unspecified: Secondary | ICD-10-CM | POA: Diagnosis not present

## 2024-06-02 DIAGNOSIS — M62521 Muscle wasting and atrophy, not elsewhere classified, right upper arm: Secondary | ICD-10-CM | POA: Diagnosis not present

## 2024-06-02 DIAGNOSIS — M62522 Muscle wasting and atrophy, not elsewhere classified, left upper arm: Secondary | ICD-10-CM | POA: Diagnosis not present

## 2024-06-02 DIAGNOSIS — R2689 Other abnormalities of gait and mobility: Secondary | ICD-10-CM | POA: Diagnosis not present

## 2024-06-02 DIAGNOSIS — M62562 Muscle wasting and atrophy, not elsewhere classified, left lower leg: Secondary | ICD-10-CM | POA: Diagnosis not present

## 2024-06-02 DIAGNOSIS — M62561 Muscle wasting and atrophy, not elsewhere classified, right lower leg: Secondary | ICD-10-CM | POA: Diagnosis not present

## 2024-06-03 DIAGNOSIS — M62561 Muscle wasting and atrophy, not elsewhere classified, right lower leg: Secondary | ICD-10-CM | POA: Diagnosis not present

## 2024-06-03 DIAGNOSIS — R1310 Dysphagia, unspecified: Secondary | ICD-10-CM | POA: Diagnosis not present

## 2024-06-03 DIAGNOSIS — M62521 Muscle wasting and atrophy, not elsewhere classified, right upper arm: Secondary | ICD-10-CM | POA: Diagnosis not present

## 2024-06-03 DIAGNOSIS — L602 Onychogryphosis: Secondary | ICD-10-CM | POA: Diagnosis not present

## 2024-06-03 DIAGNOSIS — R2689 Other abnormalities of gait and mobility: Secondary | ICD-10-CM | POA: Diagnosis not present

## 2024-06-03 DIAGNOSIS — M62562 Muscle wasting and atrophy, not elsewhere classified, left lower leg: Secondary | ICD-10-CM | POA: Diagnosis not present

## 2024-06-03 DIAGNOSIS — M62522 Muscle wasting and atrophy, not elsewhere classified, left upper arm: Secondary | ICD-10-CM | POA: Diagnosis not present

## 2024-06-04 DIAGNOSIS — M62561 Muscle wasting and atrophy, not elsewhere classified, right lower leg: Secondary | ICD-10-CM | POA: Diagnosis not present

## 2024-06-04 DIAGNOSIS — R1310 Dysphagia, unspecified: Secondary | ICD-10-CM | POA: Diagnosis not present

## 2024-06-04 DIAGNOSIS — M62562 Muscle wasting and atrophy, not elsewhere classified, left lower leg: Secondary | ICD-10-CM | POA: Diagnosis not present

## 2024-06-04 DIAGNOSIS — R2689 Other abnormalities of gait and mobility: Secondary | ICD-10-CM | POA: Diagnosis not present

## 2024-06-04 DIAGNOSIS — M62522 Muscle wasting and atrophy, not elsewhere classified, left upper arm: Secondary | ICD-10-CM | POA: Diagnosis not present

## 2024-06-04 DIAGNOSIS — M62521 Muscle wasting and atrophy, not elsewhere classified, right upper arm: Secondary | ICD-10-CM | POA: Diagnosis not present

## 2024-06-05 DIAGNOSIS — R2689 Other abnormalities of gait and mobility: Secondary | ICD-10-CM | POA: Diagnosis not present

## 2024-06-05 DIAGNOSIS — M62561 Muscle wasting and atrophy, not elsewhere classified, right lower leg: Secondary | ICD-10-CM | POA: Diagnosis not present

## 2024-06-05 DIAGNOSIS — M62522 Muscle wasting and atrophy, not elsewhere classified, left upper arm: Secondary | ICD-10-CM | POA: Diagnosis not present

## 2024-06-05 DIAGNOSIS — M62521 Muscle wasting and atrophy, not elsewhere classified, right upper arm: Secondary | ICD-10-CM | POA: Diagnosis not present

## 2024-06-05 DIAGNOSIS — M62562 Muscle wasting and atrophy, not elsewhere classified, left lower leg: Secondary | ICD-10-CM | POA: Diagnosis not present

## 2024-06-05 DIAGNOSIS — R1310 Dysphagia, unspecified: Secondary | ICD-10-CM | POA: Diagnosis not present

## 2024-06-06 DIAGNOSIS — M62522 Muscle wasting and atrophy, not elsewhere classified, left upper arm: Secondary | ICD-10-CM | POA: Diagnosis not present

## 2024-06-06 DIAGNOSIS — R1310 Dysphagia, unspecified: Secondary | ICD-10-CM | POA: Diagnosis not present

## 2024-06-06 DIAGNOSIS — M62521 Muscle wasting and atrophy, not elsewhere classified, right upper arm: Secondary | ICD-10-CM | POA: Diagnosis not present

## 2024-06-06 DIAGNOSIS — M62562 Muscle wasting and atrophy, not elsewhere classified, left lower leg: Secondary | ICD-10-CM | POA: Diagnosis not present

## 2024-06-06 DIAGNOSIS — M62561 Muscle wasting and atrophy, not elsewhere classified, right lower leg: Secondary | ICD-10-CM | POA: Diagnosis not present

## 2024-06-06 DIAGNOSIS — R2689 Other abnormalities of gait and mobility: Secondary | ICD-10-CM | POA: Diagnosis not present

## 2024-06-09 DIAGNOSIS — M62561 Muscle wasting and atrophy, not elsewhere classified, right lower leg: Secondary | ICD-10-CM | POA: Diagnosis not present

## 2024-06-09 DIAGNOSIS — M62522 Muscle wasting and atrophy, not elsewhere classified, left upper arm: Secondary | ICD-10-CM | POA: Diagnosis not present

## 2024-06-09 DIAGNOSIS — R1310 Dysphagia, unspecified: Secondary | ICD-10-CM | POA: Diagnosis not present

## 2024-06-09 DIAGNOSIS — M62562 Muscle wasting and atrophy, not elsewhere classified, left lower leg: Secondary | ICD-10-CM | POA: Diagnosis not present

## 2024-06-09 DIAGNOSIS — R2689 Other abnormalities of gait and mobility: Secondary | ICD-10-CM | POA: Diagnosis not present

## 2024-06-09 DIAGNOSIS — M62521 Muscle wasting and atrophy, not elsewhere classified, right upper arm: Secondary | ICD-10-CM | POA: Diagnosis not present

## 2024-06-10 DIAGNOSIS — R2689 Other abnormalities of gait and mobility: Secondary | ICD-10-CM | POA: Diagnosis not present

## 2024-06-10 DIAGNOSIS — M62522 Muscle wasting and atrophy, not elsewhere classified, left upper arm: Secondary | ICD-10-CM | POA: Diagnosis not present

## 2024-06-10 DIAGNOSIS — M62561 Muscle wasting and atrophy, not elsewhere classified, right lower leg: Secondary | ICD-10-CM | POA: Diagnosis not present

## 2024-06-10 DIAGNOSIS — R1310 Dysphagia, unspecified: Secondary | ICD-10-CM | POA: Diagnosis not present

## 2024-06-10 DIAGNOSIS — M62562 Muscle wasting and atrophy, not elsewhere classified, left lower leg: Secondary | ICD-10-CM | POA: Diagnosis not present

## 2024-06-10 DIAGNOSIS — M62521 Muscle wasting and atrophy, not elsewhere classified, right upper arm: Secondary | ICD-10-CM | POA: Diagnosis not present

## 2024-06-11 DIAGNOSIS — M62561 Muscle wasting and atrophy, not elsewhere classified, right lower leg: Secondary | ICD-10-CM | POA: Diagnosis not present

## 2024-06-11 DIAGNOSIS — M62521 Muscle wasting and atrophy, not elsewhere classified, right upper arm: Secondary | ICD-10-CM | POA: Diagnosis not present

## 2024-06-11 DIAGNOSIS — R1310 Dysphagia, unspecified: Secondary | ICD-10-CM | POA: Diagnosis not present

## 2024-06-11 DIAGNOSIS — R2689 Other abnormalities of gait and mobility: Secondary | ICD-10-CM | POA: Diagnosis not present

## 2024-06-11 DIAGNOSIS — M62522 Muscle wasting and atrophy, not elsewhere classified, left upper arm: Secondary | ICD-10-CM | POA: Diagnosis not present

## 2024-06-11 DIAGNOSIS — M62562 Muscle wasting and atrophy, not elsewhere classified, left lower leg: Secondary | ICD-10-CM | POA: Diagnosis not present

## 2024-06-12 DIAGNOSIS — I10 Essential (primary) hypertension: Secondary | ICD-10-CM | POA: Diagnosis not present

## 2024-06-12 DIAGNOSIS — M62561 Muscle wasting and atrophy, not elsewhere classified, right lower leg: Secondary | ICD-10-CM | POA: Diagnosis not present

## 2024-06-12 DIAGNOSIS — G894 Chronic pain syndrome: Secondary | ICD-10-CM | POA: Diagnosis not present

## 2024-06-12 DIAGNOSIS — M62522 Muscle wasting and atrophy, not elsewhere classified, left upper arm: Secondary | ICD-10-CM | POA: Diagnosis not present

## 2024-06-12 DIAGNOSIS — G959 Disease of spinal cord, unspecified: Secondary | ICD-10-CM | POA: Diagnosis not present

## 2024-06-12 DIAGNOSIS — R2689 Other abnormalities of gait and mobility: Secondary | ICD-10-CM | POA: Diagnosis not present

## 2024-06-12 DIAGNOSIS — R1310 Dysphagia, unspecified: Secondary | ICD-10-CM | POA: Diagnosis not present

## 2024-06-12 DIAGNOSIS — M62562 Muscle wasting and atrophy, not elsewhere classified, left lower leg: Secondary | ICD-10-CM | POA: Diagnosis not present

## 2024-06-12 DIAGNOSIS — M62521 Muscle wasting and atrophy, not elsewhere classified, right upper arm: Secondary | ICD-10-CM | POA: Diagnosis not present

## 2024-06-15 DIAGNOSIS — M62561 Muscle wasting and atrophy, not elsewhere classified, right lower leg: Secondary | ICD-10-CM | POA: Diagnosis not present

## 2024-06-15 DIAGNOSIS — M62521 Muscle wasting and atrophy, not elsewhere classified, right upper arm: Secondary | ICD-10-CM | POA: Diagnosis not present

## 2024-06-15 DIAGNOSIS — M62562 Muscle wasting and atrophy, not elsewhere classified, left lower leg: Secondary | ICD-10-CM | POA: Diagnosis not present

## 2024-06-15 DIAGNOSIS — M62522 Muscle wasting and atrophy, not elsewhere classified, left upper arm: Secondary | ICD-10-CM | POA: Diagnosis not present

## 2024-06-15 DIAGNOSIS — R1310 Dysphagia, unspecified: Secondary | ICD-10-CM | POA: Diagnosis not present

## 2024-06-15 DIAGNOSIS — R2689 Other abnormalities of gait and mobility: Secondary | ICD-10-CM | POA: Diagnosis not present

## 2024-06-16 DIAGNOSIS — M62521 Muscle wasting and atrophy, not elsewhere classified, right upper arm: Secondary | ICD-10-CM | POA: Diagnosis not present

## 2024-06-16 DIAGNOSIS — R2689 Other abnormalities of gait and mobility: Secondary | ICD-10-CM | POA: Diagnosis not present

## 2024-06-16 DIAGNOSIS — M62561 Muscle wasting and atrophy, not elsewhere classified, right lower leg: Secondary | ICD-10-CM | POA: Diagnosis not present

## 2024-06-16 DIAGNOSIS — M62562 Muscle wasting and atrophy, not elsewhere classified, left lower leg: Secondary | ICD-10-CM | POA: Diagnosis not present

## 2024-06-16 DIAGNOSIS — R1310 Dysphagia, unspecified: Secondary | ICD-10-CM | POA: Diagnosis not present

## 2024-06-16 DIAGNOSIS — M62522 Muscle wasting and atrophy, not elsewhere classified, left upper arm: Secondary | ICD-10-CM | POA: Diagnosis not present

## 2024-06-17 DIAGNOSIS — R1310 Dysphagia, unspecified: Secondary | ICD-10-CM | POA: Diagnosis not present

## 2024-06-17 DIAGNOSIS — M62521 Muscle wasting and atrophy, not elsewhere classified, right upper arm: Secondary | ICD-10-CM | POA: Diagnosis not present

## 2024-06-17 DIAGNOSIS — M62562 Muscle wasting and atrophy, not elsewhere classified, left lower leg: Secondary | ICD-10-CM | POA: Diagnosis not present

## 2024-06-17 DIAGNOSIS — M62561 Muscle wasting and atrophy, not elsewhere classified, right lower leg: Secondary | ICD-10-CM | POA: Diagnosis not present

## 2024-06-17 DIAGNOSIS — M62522 Muscle wasting and atrophy, not elsewhere classified, left upper arm: Secondary | ICD-10-CM | POA: Diagnosis not present

## 2024-06-17 DIAGNOSIS — R2689 Other abnormalities of gait and mobility: Secondary | ICD-10-CM | POA: Diagnosis not present

## 2024-06-18 DIAGNOSIS — M62521 Muscle wasting and atrophy, not elsewhere classified, right upper arm: Secondary | ICD-10-CM | POA: Diagnosis not present

## 2024-06-18 DIAGNOSIS — M62522 Muscle wasting and atrophy, not elsewhere classified, left upper arm: Secondary | ICD-10-CM | POA: Diagnosis not present

## 2024-06-18 DIAGNOSIS — M62562 Muscle wasting and atrophy, not elsewhere classified, left lower leg: Secondary | ICD-10-CM | POA: Diagnosis not present

## 2024-06-18 DIAGNOSIS — R2689 Other abnormalities of gait and mobility: Secondary | ICD-10-CM | POA: Diagnosis not present

## 2024-06-18 DIAGNOSIS — R1310 Dysphagia, unspecified: Secondary | ICD-10-CM | POA: Diagnosis not present

## 2024-06-18 DIAGNOSIS — M62561 Muscle wasting and atrophy, not elsewhere classified, right lower leg: Secondary | ICD-10-CM | POA: Diagnosis not present

## 2024-06-19 DIAGNOSIS — M62562 Muscle wasting and atrophy, not elsewhere classified, left lower leg: Secondary | ICD-10-CM | POA: Diagnosis not present

## 2024-06-19 DIAGNOSIS — R1310 Dysphagia, unspecified: Secondary | ICD-10-CM | POA: Diagnosis not present

## 2024-06-19 DIAGNOSIS — R2689 Other abnormalities of gait and mobility: Secondary | ICD-10-CM | POA: Diagnosis not present

## 2024-06-19 DIAGNOSIS — M62522 Muscle wasting and atrophy, not elsewhere classified, left upper arm: Secondary | ICD-10-CM | POA: Diagnosis not present

## 2024-06-19 DIAGNOSIS — M62521 Muscle wasting and atrophy, not elsewhere classified, right upper arm: Secondary | ICD-10-CM | POA: Diagnosis not present

## 2024-06-19 DIAGNOSIS — M62561 Muscle wasting and atrophy, not elsewhere classified, right lower leg: Secondary | ICD-10-CM | POA: Diagnosis not present

## 2024-06-20 DIAGNOSIS — R1310 Dysphagia, unspecified: Secondary | ICD-10-CM | POA: Diagnosis not present

## 2024-06-20 DIAGNOSIS — M62561 Muscle wasting and atrophy, not elsewhere classified, right lower leg: Secondary | ICD-10-CM | POA: Diagnosis not present

## 2024-06-20 DIAGNOSIS — R2689 Other abnormalities of gait and mobility: Secondary | ICD-10-CM | POA: Diagnosis not present

## 2024-06-20 DIAGNOSIS — M62521 Muscle wasting and atrophy, not elsewhere classified, right upper arm: Secondary | ICD-10-CM | POA: Diagnosis not present

## 2024-06-20 DIAGNOSIS — M62522 Muscle wasting and atrophy, not elsewhere classified, left upper arm: Secondary | ICD-10-CM | POA: Diagnosis not present

## 2024-06-20 DIAGNOSIS — M62562 Muscle wasting and atrophy, not elsewhere classified, left lower leg: Secondary | ICD-10-CM | POA: Diagnosis not present

## 2024-06-22 DIAGNOSIS — R1310 Dysphagia, unspecified: Secondary | ICD-10-CM | POA: Diagnosis not present

## 2024-06-22 DIAGNOSIS — M62521 Muscle wasting and atrophy, not elsewhere classified, right upper arm: Secondary | ICD-10-CM | POA: Diagnosis not present

## 2024-06-22 DIAGNOSIS — M62562 Muscle wasting and atrophy, not elsewhere classified, left lower leg: Secondary | ICD-10-CM | POA: Diagnosis not present

## 2024-06-22 DIAGNOSIS — M62561 Muscle wasting and atrophy, not elsewhere classified, right lower leg: Secondary | ICD-10-CM | POA: Diagnosis not present

## 2024-06-22 DIAGNOSIS — R2689 Other abnormalities of gait and mobility: Secondary | ICD-10-CM | POA: Diagnosis not present

## 2024-06-22 DIAGNOSIS — M62522 Muscle wasting and atrophy, not elsewhere classified, left upper arm: Secondary | ICD-10-CM | POA: Diagnosis not present

## 2024-06-23 DIAGNOSIS — R2689 Other abnormalities of gait and mobility: Secondary | ICD-10-CM | POA: Diagnosis not present

## 2024-06-23 DIAGNOSIS — M6281 Muscle weakness (generalized): Secondary | ICD-10-CM | POA: Diagnosis not present

## 2024-06-23 DIAGNOSIS — M62561 Muscle wasting and atrophy, not elsewhere classified, right lower leg: Secondary | ICD-10-CM | POA: Diagnosis not present

## 2024-06-23 DIAGNOSIS — M62562 Muscle wasting and atrophy, not elsewhere classified, left lower leg: Secondary | ICD-10-CM | POA: Diagnosis not present

## 2024-06-24 DIAGNOSIS — M6281 Muscle weakness (generalized): Secondary | ICD-10-CM | POA: Diagnosis not present

## 2024-06-24 DIAGNOSIS — R2689 Other abnormalities of gait and mobility: Secondary | ICD-10-CM | POA: Diagnosis not present

## 2024-06-24 DIAGNOSIS — M62562 Muscle wasting and atrophy, not elsewhere classified, left lower leg: Secondary | ICD-10-CM | POA: Diagnosis not present

## 2024-06-24 DIAGNOSIS — M62561 Muscle wasting and atrophy, not elsewhere classified, right lower leg: Secondary | ICD-10-CM | POA: Diagnosis not present

## 2024-06-25 DIAGNOSIS — M62561 Muscle wasting and atrophy, not elsewhere classified, right lower leg: Secondary | ICD-10-CM | POA: Diagnosis not present

## 2024-06-25 DIAGNOSIS — R2689 Other abnormalities of gait and mobility: Secondary | ICD-10-CM | POA: Diagnosis not present

## 2024-06-25 DIAGNOSIS — M6281 Muscle weakness (generalized): Secondary | ICD-10-CM | POA: Diagnosis not present

## 2024-06-25 DIAGNOSIS — M62562 Muscle wasting and atrophy, not elsewhere classified, left lower leg: Secondary | ICD-10-CM | POA: Diagnosis not present

## 2024-06-26 DIAGNOSIS — M62561 Muscle wasting and atrophy, not elsewhere classified, right lower leg: Secondary | ICD-10-CM | POA: Diagnosis not present

## 2024-06-26 DIAGNOSIS — M6281 Muscle weakness (generalized): Secondary | ICD-10-CM | POA: Diagnosis not present

## 2024-06-26 DIAGNOSIS — R2689 Other abnormalities of gait and mobility: Secondary | ICD-10-CM | POA: Diagnosis not present

## 2024-06-26 DIAGNOSIS — M62562 Muscle wasting and atrophy, not elsewhere classified, left lower leg: Secondary | ICD-10-CM | POA: Diagnosis not present

## 2024-06-27 DIAGNOSIS — M6281 Muscle weakness (generalized): Secondary | ICD-10-CM | POA: Diagnosis not present

## 2024-06-27 DIAGNOSIS — M62562 Muscle wasting and atrophy, not elsewhere classified, left lower leg: Secondary | ICD-10-CM | POA: Diagnosis not present

## 2024-06-27 DIAGNOSIS — M62561 Muscle wasting and atrophy, not elsewhere classified, right lower leg: Secondary | ICD-10-CM | POA: Diagnosis not present

## 2024-06-27 DIAGNOSIS — R2689 Other abnormalities of gait and mobility: Secondary | ICD-10-CM | POA: Diagnosis not present

## 2024-06-27 NOTE — Nursing Note (Signed)
 REMAINS ON THERAPY CASELOAD.

## 2024-06-27 NOTE — Group Note (Signed)
 LTC Activity Therapy Note <redacted file path>  Number of Participants: 10 Group Date: 06/27/2024 Start Time: 1100 End Time: 1200 Facilitators: Lennie Olam SAUNDERS   Group Bible Study with [facility] Patient Name: Judith Hunt MRN: [id number] Today's Date: 06/27/2024            I attest that I have reviewed the above information. Signed: Olam SAUNDERS Lennie Filed 06/27/2024

## 2024-06-29 DIAGNOSIS — M6281 Muscle weakness (generalized): Secondary | ICD-10-CM | POA: Diagnosis not present

## 2024-06-29 DIAGNOSIS — M62561 Muscle wasting and atrophy, not elsewhere classified, right lower leg: Secondary | ICD-10-CM | POA: Diagnosis not present

## 2024-06-29 DIAGNOSIS — R2689 Other abnormalities of gait and mobility: Secondary | ICD-10-CM | POA: Diagnosis not present

## 2024-06-29 DIAGNOSIS — M62562 Muscle wasting and atrophy, not elsewhere classified, left lower leg: Secondary | ICD-10-CM | POA: Diagnosis not present

## 2024-06-29 NOTE — LTC Provider Review (Signed)
 PHYSICAL THERAPY TREATMENT NOTE      Patient Name:  Judith Hunt      Medical Record Number: [id number]  Date of Birth: 1956-08-21 Location: [facility]        PT Precautions: Falls,   OT Precautions: Falls,   SLP Precautions: Aspiration Precautions,    Weight Bearing: RUE Weight Bearing: No Restrictions   LUE Weight Bearing: No Restrictions   RLE Weight Bearing: No Restrictions   LLE Weight Bearing: No Restrictions    SUBJECTIVE Patient had excellent response to treatment today and had no c/o increased pain from treatment, patient demonstrated excellent cooperation and will benefit from continued skilled PT treatment.  OBJECTIVE Today's Treatment:  Bed Mobility From 1: Supine Bed Mobility Type 1: To and from Bed Mobility to 1: Side lying-right, Side lying-left Level of Assistance 1: Minimal verbal cues, Minimum assistance Bed Mobility Comments 1: Bed mobility with rolling L/R in bed with Min(A) to roll and verbal cues for hand placement and use of handrails required tactile cues as well, (A) required to maintain sidelying as well showing need for continued skilled PT intervention to improve functional mobility.  Bed Mobility From 2: Supine Bed Mobility Type 2: To Bed Mobility to 2: Short sit Level of Assistance 2: Minimal verbal cues, Moderate assistance Bed Mobility Comments 2: Bed mobility training with Sup-Sit EOB with Mod(A) to move LE's off EOB and rise to sitting position with cues for sequencing and use of handrails to improve independence with functional mobility and decrease fall risk. Patient required Min(A) for initial sitting balance and Min(A) for scooting to edge of bed for safety showing need for continued skilled PT intervention to decrease dependence on caregivers/staff.         Transfer From 2: Wheelchair Transfer Type 2: To and from Transfer to 2: Stand Technique 2: Sit to stand Transfer Device 2: Standing  frame Transfer Level of Assistance 2: Dependent Trials/Comments 2: Patient directed through standing balance activity in standing frame x 5 minutes x 3 reps with focus on postural stability, use of BUE's and pushing with BLE's and shifting weight L/R through hips to improve transfer ability and standing tolerance. Patient had fewer c/o pain today when standing as well as significant increase in time standing showing improvement.  Transfer From 3: Wheelchair Transfer Type 3: To and from Transfer to 3: Wheelchair, Stand Technique 3: Sit to stand Transfer Device 3: parallel bars Transfer Level of Assistance 3: Minimal verbal cues, Moderate assistance Trials/Comments 3: STS in parallel bars with knees blocked and Mod(A) to stand 75% of the way to fully erect x 3 sets of 5 reps with long rests between sets however patient shows improved ability to stand and bear weight through LE's showing significant improvement and need for continued skilled PT intervention to improve functional mobility.       Balance Training: Static and dynamic sitting activities on EOC unsupported sitting x 15 minutes in order to improve trunk control and core stability with patientweight shifting and scooting for positioning to promote safety with unsupported sitting in order to improve independence in bed mobility and transfers. Patient requires Min-CG(A) at best to maintain balance and stability EOC with verbal cues to improve technique and maximize results of activity.       Seated-Exercise Comments: Manual and positional Hamstring stretching with simultaneous ankle DF stretch after exercise to improve flexibility and increase TKE to improve LE stability when standing.  OmniCycle Comments: Ther-Ex for BLE's on Omnicycle level 2 resistance for 10 minutes x 2 reps once forward and once reverse direction to improve strength and cardiopulmonary endurance. Patient received instructions in proper hip/kn alignment while maintaining  upright sitting posture to maximize results, performed per patient's ability in order to improve functional strength and encourage reciprocal motion needed to increase independence in functional transfers and mobility. Provided verbal cues for proper activity pacing to prevent fatigue.     I attest that I have reviewed the above information. SignedBETHA Rankin Lime, PTA 06/29/2024

## 2024-06-30 DIAGNOSIS — M62562 Muscle wasting and atrophy, not elsewhere classified, left lower leg: Secondary | ICD-10-CM | POA: Diagnosis not present

## 2024-06-30 DIAGNOSIS — R2689 Other abnormalities of gait and mobility: Secondary | ICD-10-CM | POA: Diagnosis not present

## 2024-06-30 DIAGNOSIS — M62561 Muscle wasting and atrophy, not elsewhere classified, right lower leg: Secondary | ICD-10-CM | POA: Diagnosis not present

## 2024-06-30 DIAGNOSIS — M6281 Muscle weakness (generalized): Secondary | ICD-10-CM | POA: Diagnosis not present

## 2024-06-30 NOTE — Nursing Note (Signed)
 RSD GIVEN PAIN MEDICATION AT 1445 PER HER REQUEST.

## 2024-06-30 NOTE — Nursing Note (Signed)
 RESIDENT WAS GIVEN CASH OUT OF NARC BOX FOR APPT. oT THERAPIST WAS IN ROOM WHEN MONEY WAS COUNTED OUT AND PLACE IN RESIDENT POCKET BOOK A TOTAL OF $550. RESIDENT LEFT VIA W/C WITH TRANSPORT FOR APPT.

## 2024-06-30 NOTE — Nursing Note (Addendum)
 RSD REMAINS ON THERAPY CASELOAD. RSD LOA THIS DAY TO GET BOTTOM DENTURES.

## 2024-07-01 DIAGNOSIS — M6281 Muscle weakness (generalized): Secondary | ICD-10-CM | POA: Diagnosis not present

## 2024-07-01 DIAGNOSIS — M62562 Muscle wasting and atrophy, not elsewhere classified, left lower leg: Secondary | ICD-10-CM | POA: Diagnosis not present

## 2024-07-01 DIAGNOSIS — R2689 Other abnormalities of gait and mobility: Secondary | ICD-10-CM | POA: Diagnosis not present

## 2024-07-01 DIAGNOSIS — M62561 Muscle wasting and atrophy, not elsewhere classified, right lower leg: Secondary | ICD-10-CM | POA: Diagnosis not present

## 2024-07-01 NOTE — Transitions of Care (Post Inpatient/ED Visit) (Unsigned)
 07/01/2024  Patient ID: Judith Hunt, female   DOB: [birth year], 68 y.o.   MRN: [id number]  07/01/24 TOC RN CM post discharge outreach chart review noted that patient was a resident at [facility] in [place], [place], and no admission to acute care was seen/indicated in chart review or discharge from rehab facility. No outreach will be made due to this exclusion on chart review for potential TOC outreach.   Bing Edison MSN, RN RN Case Sales executive Health  [facility] Office Hours M-F [phone] Direct Dial: [phone] Main Phone [phone]  Fax: [phone] [email or web address]

## 2024-07-02 DIAGNOSIS — M62562 Muscle wasting and atrophy, not elsewhere classified, left lower leg: Secondary | ICD-10-CM | POA: Diagnosis not present

## 2024-07-02 DIAGNOSIS — M62561 Muscle wasting and atrophy, not elsewhere classified, right lower leg: Secondary | ICD-10-CM | POA: Diagnosis not present

## 2024-07-02 DIAGNOSIS — R2689 Other abnormalities of gait and mobility: Secondary | ICD-10-CM | POA: Diagnosis not present

## 2024-07-02 DIAGNOSIS — M6281 Muscle weakness (generalized): Secondary | ICD-10-CM | POA: Diagnosis not present

## 2024-07-03 DIAGNOSIS — M62561 Muscle wasting and atrophy, not elsewhere classified, right lower leg: Secondary | ICD-10-CM | POA: Diagnosis not present

## 2024-07-03 DIAGNOSIS — M62562 Muscle wasting and atrophy, not elsewhere classified, left lower leg: Secondary | ICD-10-CM | POA: Diagnosis not present

## 2024-07-03 DIAGNOSIS — R2689 Other abnormalities of gait and mobility: Secondary | ICD-10-CM | POA: Diagnosis not present

## 2024-07-03 DIAGNOSIS — M6281 Muscle weakness (generalized): Secondary | ICD-10-CM | POA: Diagnosis not present

## 2024-07-06 DIAGNOSIS — M62562 Muscle wasting and atrophy, not elsewhere classified, left lower leg: Secondary | ICD-10-CM | POA: Diagnosis not present

## 2024-07-06 DIAGNOSIS — R2689 Other abnormalities of gait and mobility: Secondary | ICD-10-CM | POA: Diagnosis not present

## 2024-07-06 DIAGNOSIS — M6281 Muscle weakness (generalized): Secondary | ICD-10-CM | POA: Diagnosis not present

## 2024-07-06 DIAGNOSIS — M62561 Muscle wasting and atrophy, not elsewhere classified, right lower leg: Secondary | ICD-10-CM | POA: Diagnosis not present

## 2024-07-07 DIAGNOSIS — M6281 Muscle weakness (generalized): Secondary | ICD-10-CM | POA: Diagnosis not present

## 2024-07-07 DIAGNOSIS — M62562 Muscle wasting and atrophy, not elsewhere classified, left lower leg: Secondary | ICD-10-CM | POA: Diagnosis not present

## 2024-07-07 DIAGNOSIS — R2689 Other abnormalities of gait and mobility: Secondary | ICD-10-CM | POA: Diagnosis not present

## 2024-07-07 DIAGNOSIS — M62561 Muscle wasting and atrophy, not elsewhere classified, right lower leg: Secondary | ICD-10-CM | POA: Diagnosis not present

## 2024-07-08 DIAGNOSIS — M62561 Muscle wasting and atrophy, not elsewhere classified, right lower leg: Secondary | ICD-10-CM | POA: Diagnosis not present

## 2024-07-08 DIAGNOSIS — M62562 Muscle wasting and atrophy, not elsewhere classified, left lower leg: Secondary | ICD-10-CM | POA: Diagnosis not present

## 2024-07-08 DIAGNOSIS — M6281 Muscle weakness (generalized): Secondary | ICD-10-CM | POA: Diagnosis not present

## 2024-07-08 DIAGNOSIS — R2689 Other abnormalities of gait and mobility: Secondary | ICD-10-CM | POA: Diagnosis not present

## 2024-07-09 DIAGNOSIS — M62562 Muscle wasting and atrophy, not elsewhere classified, left lower leg: Secondary | ICD-10-CM | POA: Diagnosis not present

## 2024-07-09 DIAGNOSIS — M62561 Muscle wasting and atrophy, not elsewhere classified, right lower leg: Secondary | ICD-10-CM | POA: Diagnosis not present

## 2024-07-09 DIAGNOSIS — R2689 Other abnormalities of gait and mobility: Secondary | ICD-10-CM | POA: Diagnosis not present

## 2024-07-09 DIAGNOSIS — M6281 Muscle weakness (generalized): Secondary | ICD-10-CM | POA: Diagnosis not present

## 2024-07-11 DIAGNOSIS — R2689 Other abnormalities of gait and mobility: Secondary | ICD-10-CM | POA: Diagnosis not present

## 2024-07-11 DIAGNOSIS — M62562 Muscle wasting and atrophy, not elsewhere classified, left lower leg: Secondary | ICD-10-CM | POA: Diagnosis not present

## 2024-07-11 DIAGNOSIS — M6281 Muscle weakness (generalized): Secondary | ICD-10-CM | POA: Diagnosis not present

## 2024-07-11 DIAGNOSIS — M62561 Muscle wasting and atrophy, not elsewhere classified, right lower leg: Secondary | ICD-10-CM | POA: Diagnosis not present

## 2024-07-13 DIAGNOSIS — M62562 Muscle wasting and atrophy, not elsewhere classified, left lower leg: Secondary | ICD-10-CM | POA: Diagnosis not present

## 2024-07-13 DIAGNOSIS — M62561 Muscle wasting and atrophy, not elsewhere classified, right lower leg: Secondary | ICD-10-CM | POA: Diagnosis not present

## 2024-07-13 DIAGNOSIS — M6281 Muscle weakness (generalized): Secondary | ICD-10-CM | POA: Diagnosis not present

## 2024-07-13 DIAGNOSIS — R2689 Other abnormalities of gait and mobility: Secondary | ICD-10-CM | POA: Diagnosis not present

## 2024-07-14 DIAGNOSIS — R2689 Other abnormalities of gait and mobility: Secondary | ICD-10-CM | POA: Diagnosis not present

## 2024-07-14 DIAGNOSIS — M62562 Muscle wasting and atrophy, not elsewhere classified, left lower leg: Secondary | ICD-10-CM | POA: Diagnosis not present

## 2024-07-14 DIAGNOSIS — M6281 Muscle weakness (generalized): Secondary | ICD-10-CM | POA: Diagnosis not present

## 2024-07-14 DIAGNOSIS — M62561 Muscle wasting and atrophy, not elsewhere classified, right lower leg: Secondary | ICD-10-CM | POA: Diagnosis not present

## 2024-07-15 DIAGNOSIS — M62561 Muscle wasting and atrophy, not elsewhere classified, right lower leg: Secondary | ICD-10-CM | POA: Diagnosis not present

## 2024-07-15 DIAGNOSIS — R2689 Other abnormalities of gait and mobility: Secondary | ICD-10-CM | POA: Diagnosis not present

## 2024-07-15 DIAGNOSIS — M62562 Muscle wasting and atrophy, not elsewhere classified, left lower leg: Secondary | ICD-10-CM | POA: Diagnosis not present

## 2024-07-15 DIAGNOSIS — M6281 Muscle weakness (generalized): Secondary | ICD-10-CM | POA: Diagnosis not present

## 2024-07-16 DIAGNOSIS — M62562 Muscle wasting and atrophy, not elsewhere classified, left lower leg: Secondary | ICD-10-CM | POA: Diagnosis not present

## 2024-07-16 DIAGNOSIS — M6281 Muscle weakness (generalized): Secondary | ICD-10-CM | POA: Diagnosis not present

## 2024-07-16 DIAGNOSIS — R2689 Other abnormalities of gait and mobility: Secondary | ICD-10-CM | POA: Diagnosis not present

## 2024-07-16 DIAGNOSIS — M62561 Muscle wasting and atrophy, not elsewhere classified, right lower leg: Secondary | ICD-10-CM | POA: Diagnosis not present

## 2024-07-17 DIAGNOSIS — R2689 Other abnormalities of gait and mobility: Secondary | ICD-10-CM | POA: Diagnosis not present

## 2024-07-17 DIAGNOSIS — M62562 Muscle wasting and atrophy, not elsewhere classified, left lower leg: Secondary | ICD-10-CM | POA: Diagnosis not present

## 2024-07-17 DIAGNOSIS — M6281 Muscle weakness (generalized): Secondary | ICD-10-CM | POA: Diagnosis not present

## 2024-07-17 DIAGNOSIS — M62561 Muscle wasting and atrophy, not elsewhere classified, right lower leg: Secondary | ICD-10-CM | POA: Diagnosis not present

## 2024-07-20 DIAGNOSIS — M62561 Muscle wasting and atrophy, not elsewhere classified, right lower leg: Secondary | ICD-10-CM | POA: Diagnosis not present

## 2024-07-20 DIAGNOSIS — M6281 Muscle weakness (generalized): Secondary | ICD-10-CM | POA: Diagnosis not present

## 2024-07-20 DIAGNOSIS — M62562 Muscle wasting and atrophy, not elsewhere classified, left lower leg: Secondary | ICD-10-CM | POA: Diagnosis not present

## 2024-07-20 DIAGNOSIS — R2689 Other abnormalities of gait and mobility: Secondary | ICD-10-CM | POA: Diagnosis not present

## 2024-07-21 DIAGNOSIS — M6281 Muscle weakness (generalized): Secondary | ICD-10-CM | POA: Diagnosis not present

## 2024-07-21 DIAGNOSIS — M62561 Muscle wasting and atrophy, not elsewhere classified, right lower leg: Secondary | ICD-10-CM | POA: Diagnosis not present

## 2024-07-21 DIAGNOSIS — R2689 Other abnormalities of gait and mobility: Secondary | ICD-10-CM | POA: Diagnosis not present

## 2024-07-21 DIAGNOSIS — M62562 Muscle wasting and atrophy, not elsewhere classified, left lower leg: Secondary | ICD-10-CM | POA: Diagnosis not present

## 2024-07-22 DIAGNOSIS — R2689 Other abnormalities of gait and mobility: Secondary | ICD-10-CM | POA: Diagnosis not present

## 2024-07-22 DIAGNOSIS — M6281 Muscle weakness (generalized): Secondary | ICD-10-CM | POA: Diagnosis not present

## 2024-07-22 DIAGNOSIS — M62561 Muscle wasting and atrophy, not elsewhere classified, right lower leg: Secondary | ICD-10-CM | POA: Diagnosis not present

## 2024-07-22 DIAGNOSIS — M62562 Muscle wasting and atrophy, not elsewhere classified, left lower leg: Secondary | ICD-10-CM | POA: Diagnosis not present

## 2024-07-23 DIAGNOSIS — M62561 Muscle wasting and atrophy, not elsewhere classified, right lower leg: Secondary | ICD-10-CM | POA: Diagnosis not present

## 2024-07-23 DIAGNOSIS — R2689 Other abnormalities of gait and mobility: Secondary | ICD-10-CM | POA: Diagnosis not present

## 2024-07-23 DIAGNOSIS — M6281 Muscle weakness (generalized): Secondary | ICD-10-CM | POA: Diagnosis not present

## 2024-07-23 DIAGNOSIS — M62562 Muscle wasting and atrophy, not elsewhere classified, left lower leg: Secondary | ICD-10-CM | POA: Diagnosis not present

## 2024-07-24 DIAGNOSIS — R2681 Unsteadiness on feet: Secondary | ICD-10-CM | POA: Diagnosis not present

## 2024-07-27 DIAGNOSIS — E782 Mixed hyperlipidemia: Secondary | ICD-10-CM | POA: Diagnosis not present

## 2024-07-27 DIAGNOSIS — Z791 Long term (current) use of non-steroidal anti-inflammatories (NSAID): Secondary | ICD-10-CM | POA: Diagnosis not present

## 2024-07-27 DIAGNOSIS — M1990 Unspecified osteoarthritis, unspecified site: Secondary | ICD-10-CM | POA: Diagnosis not present

## 2024-07-27 DIAGNOSIS — I10 Essential (primary) hypertension: Secondary | ICD-10-CM | POA: Diagnosis not present

## 2024-07-28 DIAGNOSIS — R2681 Unsteadiness on feet: Secondary | ICD-10-CM | POA: Diagnosis not present

## 2024-07-28 DIAGNOSIS — M6281 Muscle weakness (generalized): Secondary | ICD-10-CM | POA: Diagnosis not present

## 2024-07-29 DIAGNOSIS — R2681 Unsteadiness on feet: Secondary | ICD-10-CM | POA: Diagnosis not present

## 2024-07-30 DIAGNOSIS — R2681 Unsteadiness on feet: Secondary | ICD-10-CM | POA: Diagnosis not present

## 2024-07-31 DIAGNOSIS — R2681 Unsteadiness on feet: Secondary | ICD-10-CM | POA: Diagnosis not present

## 2024-08-01 DIAGNOSIS — R2681 Unsteadiness on feet: Secondary | ICD-10-CM | POA: Diagnosis not present

## 2024-08-03 DIAGNOSIS — E876 Hypokalemia: Secondary | ICD-10-CM | POA: Diagnosis not present

## 2024-08-06 DIAGNOSIS — R2681 Unsteadiness on feet: Secondary | ICD-10-CM | POA: Diagnosis not present

## 2024-08-07 DIAGNOSIS — R2681 Unsteadiness on feet: Secondary | ICD-10-CM | POA: Diagnosis not present

## 2024-08-07 DIAGNOSIS — M6281 Muscle weakness (generalized): Secondary | ICD-10-CM | POA: Diagnosis not present

## 2024-08-08 DIAGNOSIS — R2681 Unsteadiness on feet: Secondary | ICD-10-CM | POA: Diagnosis not present

## 2024-08-08 DIAGNOSIS — M6281 Muscle weakness (generalized): Secondary | ICD-10-CM | POA: Diagnosis not present

## 2024-08-12 DIAGNOSIS — R2681 Unsteadiness on feet: Secondary | ICD-10-CM | POA: Diagnosis not present

## 2024-08-13 DIAGNOSIS — R2681 Unsteadiness on feet: Secondary | ICD-10-CM | POA: Diagnosis not present

## 2024-08-14 DIAGNOSIS — R2681 Unsteadiness on feet: Secondary | ICD-10-CM | POA: Diagnosis not present

## 2024-08-14 DIAGNOSIS — M6281 Muscle weakness (generalized): Secondary | ICD-10-CM | POA: Diagnosis not present

## 2024-08-15 DIAGNOSIS — R2681 Unsteadiness on feet: Secondary | ICD-10-CM | POA: Diagnosis not present

## 2024-08-17 DIAGNOSIS — R2681 Unsteadiness on feet: Secondary | ICD-10-CM | POA: Diagnosis not present

## 2024-08-22 DIAGNOSIS — G894 Chronic pain syndrome: Secondary | ICD-10-CM | POA: Diagnosis not present

## 2024-08-22 DIAGNOSIS — G959 Disease of spinal cord, unspecified: Secondary | ICD-10-CM | POA: Diagnosis not present

## 2024-08-22 DIAGNOSIS — I10 Essential (primary) hypertension: Secondary | ICD-10-CM | POA: Diagnosis not present

## 2024-09-01 ENCOUNTER — Encounter (INDEPENDENT_AMBULATORY_CARE_PROVIDER_SITE_OTHER): Payer: Self-pay | Admitting: Gastroenterology

## 2024-09-01 ENCOUNTER — Ambulatory Visit (INDEPENDENT_AMBULATORY_CARE_PROVIDER_SITE_OTHER): Admitting: Gastroenterology

## 2024-09-01 VITALS — BP 127/78 | HR 60 | Temp 98.0°F | Ht 66.0 in | Wt 172.0 lb

## 2024-09-01 DIAGNOSIS — K5900 Constipation, unspecified: Secondary | ICD-10-CM | POA: Diagnosis not present

## 2024-09-01 DIAGNOSIS — R1312 Dysphagia, oropharyngeal phase: Secondary | ICD-10-CM | POA: Diagnosis not present

## 2024-09-01 DIAGNOSIS — K5901 Slow transit constipation: Secondary | ICD-10-CM

## 2024-09-01 NOTE — Nursing Note (Signed)
 FACILITY VAN TRANSPORTED TO GI APPT APPT THIS MORNING AND RETURNED. OFFICE TO SCHEDULE MODIFIED BARIUM SWALLOW STUDY WITH SPEECH THERAPY EVAL FOR ONGOING SWALLOWING ISSUES. F/U 3 MONTHS.

## 2024-09-01 NOTE — Progress Notes (Signed)
 Referring Provider: Toribio Jerel MATSU, MD Primary Care Physician:  Toribio Jerel MATSU, MD Primary GI Physician: Dr. Cinderella   Chief Complaint  Patient presents with   Follow-up    Patient here today for a follow up from her recent EGD on 05/14/2024. Patient had her throat stretch at that time. Patient says she still has issues with choking especially with foods.   HPI:   Judith Hunt is a 68 y.o. female with past medical history of cervical myelopathy s/p fusion C3-C7,cervical kyphosis,essential hypertension, chronic left-sided hemiplegia residing in a skilled facility, chronic pain, anxiety and depression   Patient presenting today for:  Follow up of esophageal dysphagia constipation  Last seen in may, at that time having dysphagia daily. Only heartburn with bananas. Declined barium swallow test due to concern for swallowing tablet. Constipation improved, eating prunes, benefiber  Recommended chewing precautions, EGD, continue benefiber, good water intake  EGD done 05/14/24- Benign-appearing esophageal stenosis. Dilated.                            Biopsied.                           - Esophageal plaques were found, suspicious for                            candidiasis. Brushings performed.                           - Gastritis. Biopsied.                           - Scarred mucosa in the antrum. Biopsied.                           - Non-bleeding duodenal ulcer with a clean ulcer                            base (Forrest Class III). A. STOMACH, BIOPSY:  - Gastric antral and oxyntic mucosa with mild nonspecific reactive  gastropathy  - Helicobacter pylori-like organisms are not identified on routine HE  stain   B. ESOPHAGUS, BIOPSY:  - Esophageal squamous mucosa with no specific histopathologic changes  - Negative for increased intraepithelial eosinophils   Present:  States some improvement in dysphagia but still having frequent issues, mostly with foods. Some coughing with eating  and drinking. No odynophagia. Denies any heartburn or acid regurgitation. She notes that usually she will have to spit whatever substance she has tried to swallow back up when she feels like she is choking.  She states some continued constipation. Thinks she may be having a BM about every 3 days. Taking miralax  BID, and benefiber each morning, she has metamucil listed as well but does not think she is taking this. Denies straining to have a BM. She denies any abdominal pain but reports some gas. Water intake is not great per patient, she also reports diet is low in fruits and veggies.   Last SLP Eval with modified 2022 reports Pt presents with a functional oropharyngeal swallow per results  of MBSS.  Last Colonoscopy: 2010 normal  Did cologuard maybe 5 years ago   [employer]  09/01/24 1007  Weight: 172 lb (78 kg)     Past Medical History:  Diagnosis Date   Anxiety    COPD (chronic obstructive pulmonary disease) (HCC)    Depression    GERD (gastroesophageal reflux disease)    Hypertension    Influenza    Osteoarthritis    Stroke [facility])     Past Surgical History:  Procedure Laterality Date   ANTERIOR CERVICAL DISCECTOMY     BREAST BIOPSY Left    CATARACT EXTRACTION W/ INTRAOCULAR LENS IMPLANT & ANTERIOR VITRECTOMY, BILATERAL Bilateral    ESOPHAGOGASTRODUODENOSCOPY N/A 05/14/2024   Procedure: EGD (ESOPHAGOGASTRODUODENOSCOPY);  Surgeon: Cinderella Deatrice FALCON, MD;  [facility];  Service: Endoscopy;  Laterality: N/A;  11:00AM;ASA 3   FEMUR FRACTURE SURGERY Bilateral    ORIF TIBIA FRACTURE Left     Current Outpatient Medications  Medication Sig Dispense Refill   atorvastatin (LIPITOR) 40 MG tablet Take 40 mg by mouth daily.     DULoxetine (CYMBALTA) 60 MG capsule Take 60 mg by mouth 2 (two) times daily.     fexofenadine (ALLEGRA) 180 MG tablet Take 180 mg by mouth 2 (two) times daily.     gabapentin (NEURONTIN) 800 MG tablet Take 800 mg by mouth 3 (three) times daily.      lisinopril (ZESTRIL) 10 MG tablet Take 10 mg by mouth daily.     metoprolol succinate (TOPROL-XL) 25 MG 24 hr tablet Take 25 mg by mouth daily.     naloxone (NARCAN) nasal spray 4 mg/0.1 mL Place 1 spray into the nose. (Patient taking differently: Place 1 spray into the nose as needed.)     omeprazole (PRILOSEC) 40 MG capsule Take 40 mg by mouth daily.     oxyCODONE-acetaminophen  (PERCOCET/ROXICET) 5-325 MG tablet Take by mouth every 8 (eight) hours as needed for severe pain.     polyethylene glycol (MIRALAX  / GLYCOLAX ) 17 g packet Take 17 g by mouth two times daily (Patient taking differently: AS NEEDED.) 60 each 0   Potassium Chloride (MICRO-K PO) Take 20 mEq by mouth daily at 6 (six) AM.     predniSONE (DELTASONE) 5 MG tablet Take 5 mg by mouth daily with breakfast.     psyllium (METAMUCIL) 58.6 % packet Take 1 packet by mouth 2 (two) times daily. (Patient taking differently: Take 1 packet by mouth daily.) 60 each 0   traZODone (DESYREL) 100 MG tablet Take 100 mg by mouth at bedtime.     No current facility-administered medications for this visit.    Allergies as of 09/01/2024 - Review Complete 09/01/2024  Allergen Reaction Noted   Azactam in iso-osmotic dextrose [aztreonam]  07/31/2023   Fentanyl  Other (See Comments) 05/12/2024   Latex  07/31/2023   Nickel  07/31/2023   Penicillin g benzathine  07/31/2023   Penicillin g procaine  07/31/2023   Penicillin g sodium  07/31/2023   Penicillin v potassium  07/31/2023   Penicillins  07/31/2023   Silicone  09/01/2024   Tape  07/31/2023   Thimerosal  07/31/2023   Zanaflex [tizanidine]  07/31/2023    Social History   Socioeconomic History   Marital status: Divorced    Spouse name: Not on file   Number of children: Not on file   Years of education: Not on file   Highest education level: Not on file  Occupational History   Not on file  Tobacco Use   Smoking status: Former    Types: Cigarettes    Passive exposure: Past  Smokeless tobacco: Never  Vaping Use   Vaping status: Never Used  Substance and Sexual Activity   Alcohol  use: Not Currently   Drug use: Never   Sexual activity: Not on file  Other Topics Concern   Not on file  Social History Narrative   Not on file   Social Drivers of Health   Financial Resource Strain: Low Risk  (10/27/2021)   Received from [facility]   Overall Financial Resource Strain (CARDIA)    Difficulty of Paying Living Expenses: Not hard at all  Food Insecurity: No Food Insecurity (01/02/2022)   Received from [facility]   Hunger Vital Sign    Within the past 12 months, you worried that your food would run out before you got the money to buy more.: Never true    Within the past 12 months, the food you bought just didn't last and you didn't have money to get more.: Never true  Transportation Needs: No Transportation Needs (03/10/2024)   Received from [facility]   PRAPARE - Transportation    Lack of Transportation (Medical): No    Lack of Transportation (Non-Medical): No  Physical Activity: Inactive (10/01/2021)   Received from [facility]   Exercise Vital Sign    On average, how many days per week do you engage in moderate to strenuous exercise (like a brisk walk)?: 0 days    On average, how many minutes do you engage in exercise at this level?: 0 min  Stress: No Stress Concern Present (10/27/2021)   Received from [facility] - Occupational Stress Questionnaire    Feeling of Stress : Only a little  Social Connections: Unknown (04/25/2022)   Received from [facility]   Social Network    Social Network: Not on file    Review of systems General: negative for malaise, night sweats, fever, chills, weight loss Neck: Negative for lumps, goiter, pain and significant neck swelling Resp: Negative for cough, wheezing, dyspnea at rest CV: Negative for chest pain, leg swelling, palpitations, orthopnea GI: denies melena,  hematochezia, nausea, vomiting, diarrhea, odyonophagia, early satiety or unintentional weight loss. +dysphagia +constipation  MSK: Negative for joint pain or swelling, back pain, and muscle pain. Derm: Negative for itching or rash Psych: Denies depression, anxiety, memory loss, confusion. No homicidal or suicidal ideation.  Heme: Negative for prolonged bleeding, bruising easily, and swollen nodes. Endocrine: Negative for cold or heat intolerance, polyuria, polydipsia and goiter. Neuro: negative for tremor, gait imbalance, syncope and seizures. The remainder of the review of systems is noncontributory.  Physical Exam: BP 127/78 (BP Location: Left Arm, Patient Position: Sitting, Cuff Size: Large)   Pulse 60   Temp 98 F (36.7 C) (Temporal)   Ht 5' 6 (1.676 m)   Wt 172 lb (78 kg) Comment: Weight per patient and aide  BMI 27.76 kg/m  General:   Alert and oriented. No distress noted. Pleasant and cooperative.  Head:  Normocephalic and atraumatic. Eyes:  Conjuctiva clear without scleral icterus. Mouth:  Oral mucosa pink and moist. Good dentition. No lesions. Heart: Normal rate and rhythm, s1 and s2 heart sounds present.  Lungs: Clear lung sounds in all lobes. Respirations equal and unlabored. Abdomen:  +BS, soft, non-tender and non-distended. No rebound or guarding. No HSM or masses noted. Derm: No palmar erythema or jaundice Msk:  Symmetrical without gross deformities. Normal posture. Extremities:  Without edema. Neurologic:  Alert and  oriented x4 Psych:  Alert and cooperative.  Normal mood and affect.  Invalid input(s): 6 MONTHS   ASSESSMENT: MANPREET KEMMER is a 68 y.o. female presenting today for follow up of Constipation and dysphagia  Constipation: -mostly well managed with benefiber, miralax  -denies need to strain -BM maybe every 3 days  Encouraged to continue with current bowel regimen, good water intake, diet high in fruits, veggies, whole grains    Dysphagia:   -MBSS in 2022 with functional oropharyngeal swallowing -recent EGD with esophageal stenosis, dilated, she notes maybe some improvement though continues to have dysphagia/coughing with eating/drinking -suspect this is mostly oropharyngeal dysphagia, could be some presence of dysmotility -recommend repeat MBSS with SLP as I want to rule out aspiration, given she is wheelchair bound, immobile, she is certainly at risk for this  Should continue with chewing/swallowing precautions for now.    PLAN: -MBSS with SLP evaluation -continue miralax  BID and benefiber  -increase water intake, aim for around 60 oz a day  -increase fruits and veggies in diet  -chewing/swallowing precautions  All questions were answered, patient verbalized understanding and is in agreement with plan as outlined above.   Follow Up: 3 months   Hildreth Robart L. Makalya Nave, MSN, APRN, AGNP-C Adult-Gerontology Nurse Practitioner [facility]

## 2024-09-01 NOTE — Patient Instructions (Signed)
-  we will schedule modified barium swallow study with speech therapy evaluation for ongoing swallowing issues  -continue miralax  BID and benefiber  -increase water intake, aim for around 60 oz a day  -increase fruits and veggies  -chewing/swallowing precautions, taking small bites, chewing thoroughly, sipping liquids between bites  Follow up 3 months  It was a pleasure to see you today. I want to create trusting relationships with patients and provide genuine, compassionate, and quality care. I truly value your feedback! please be on the lookout for a survey regarding your visit with me today. I appreciate your input about our visit and your time in completing this!    Leelynd Maldonado L. Lanitra Battaglini, MSN, APRN, AGNP-C Adult-Gerontology Nurse Practitioner [facility] at [facility]

## 2024-09-01 NOTE — Group Note (Signed)
 LTC Activity Therapy Note <redacted file path>  Number of Participants: 15 Group Date: 09/01/2024 Start Time: 1400 End Time: 1500 Facilitators: Robbert Rosaline LABOR, CNA   Bingo Tuesday Patient Name: Judith Hunt MRN: [id number] Today's Date: 09/01/2024            I attest that I have reviewed the above information. Signed: Rosaline LABOR Robbert, CNA Filed 09/01/2024

## 2024-09-02 NOTE — Nursing Note (Signed)
 Pt had shower this hs tolerated well.

## 2024-09-03 ENCOUNTER — Other Ambulatory Visit (INDEPENDENT_AMBULATORY_CARE_PROVIDER_SITE_OTHER): Payer: Self-pay | Admitting: Gastroenterology

## 2024-09-03 DIAGNOSIS — R1312 Dysphagia, oropharyngeal phase: Secondary | ICD-10-CM

## 2024-09-03 DIAGNOSIS — R058 Other specified cough: Secondary | ICD-10-CM

## 2024-09-03 DIAGNOSIS — Z23 Encounter for immunization: Secondary | ICD-10-CM | POA: Diagnosis not present

## 2024-09-08 ENCOUNTER — Other Ambulatory Visit (HOSPITAL_COMMUNITY): Payer: Self-pay | Admitting: Occupational Therapy

## 2024-09-08 DIAGNOSIS — K219 Gastro-esophageal reflux disease without esophagitis: Secondary | ICD-10-CM

## 2024-09-08 DIAGNOSIS — H524 Presbyopia: Secondary | ICD-10-CM | POA: Diagnosis not present

## 2024-09-08 DIAGNOSIS — R1312 Dysphagia, oropharyngeal phase: Secondary | ICD-10-CM

## 2024-09-08 DIAGNOSIS — Z961 Presence of intraocular lens: Secondary | ICD-10-CM | POA: Diagnosis not present

## 2024-09-08 DIAGNOSIS — H57813 Brow ptosis, bilateral: Secondary | ICD-10-CM | POA: Diagnosis not present

## 2024-09-11 DIAGNOSIS — L603 Nail dystrophy: Secondary | ICD-10-CM | POA: Diagnosis not present

## 2024-09-11 DIAGNOSIS — L602 Onychogryphosis: Secondary | ICD-10-CM | POA: Diagnosis not present

## 2024-09-16 ENCOUNTER — Ambulatory Visit (INDEPENDENT_AMBULATORY_CARE_PROVIDER_SITE_OTHER): Payer: Self-pay | Admitting: Gastroenterology

## 2024-09-16 ENCOUNTER — Ambulatory Visit (HOSPITAL_COMMUNITY): Attending: Gastroenterology | Admitting: Speech Pathology

## 2024-09-16 ENCOUNTER — Ambulatory Visit (HOSPITAL_COMMUNITY)
Admission: RE | Admit: 2024-09-16 | Discharge: 2024-09-16 | Disposition: A | Discharge status: Home / Self Care | Source: Ambulatory Visit | Attending: Gastroenterology | Admitting: Gastroenterology

## 2024-09-16 ENCOUNTER — Other Ambulatory Visit: Payer: Self-pay

## 2024-09-16 DIAGNOSIS — R1312 Dysphagia, oropharyngeal phase: Secondary | ICD-10-CM | POA: Diagnosis not present

## 2024-09-16 DIAGNOSIS — K219 Gastro-esophageal reflux disease without esophagitis: Secondary | ICD-10-CM | POA: Insufficient documentation

## 2024-09-16 NOTE — Therapy (Signed)
 Modified Barium Swallow Study  Patient Details  Name: Judith Hunt MRN: [id number] Date of Birth: November 19, 1956  Today's Date: 09/16/2024  HPI/PMH: HPI: Judith Hunt is a 68 yo female who was referred for MBSS by Mitzie Boettcher, NP (GI) due to Pt's reports of occasional coughing and choking with meals and liquids. She has a past medical history of cervical myelopathy s/p fusion C3-C7,cervical kyphosis,essential hypertension, chronic left-sided hemiplegia residing in a skilled facility, chronic pain, anxiety and depression. She had EGD with dilation May 2025   End of Session - 09/16/24 1315     Visit Number 1    Number of Visits 1    Authorization Type [employer] Medicare    SLP Start Time 1135    SLP Stop Time  1210    SLP Time Calculation (min) 35 min    Activity Tolerance Patient tolerated treatment well          Clinical Impression: Clinical Impression: Pt presents with mild/mod oropharyngeal dysphagia characterized by lingual weakness and impaired mastication resulting in prolonged oral transit and oral residuals, delay in swallow trigger occurring after spilling to the pyriforms with liquids and occasionally after filling the pyriforms, reduced tongue base retraction with vallecular residue, reduced laryngeal vestibule closure resulting in trace aspiration of thins and suspected with NTL once mixed with secretions and spilled into laryngeal vestibule. Pt did not sense the trace aspiration, however SLP eventually cued Pt to clear throat (it was just beneath the vocal folds) and it was expelled. Pt has a very difficult time initiating a secondary cued swallow and takes 20+ seconds (in attempt to clear lingual, vallecular, and pyriform residuals). Pt was cued to take a small sip from cup thin and implement chin tuck and this was successful in preventing penetration/aspiration. She swallowed the barium tablet with cup thin and the pill became transiently delayed in the valleculae and was not  removed by additional thin wash until Pt cued to complete chin tuck with cup thin (and pill traversed to esophagus). Esophageal sweep was unremarkable. Recommend regular textures (but meats cut up and most food bite sized) and thin liquids via small, cup sip with a chin tuck and avoid use of straws, Ok for pills whole with water or take with puree. This report will be sent to [facility] and PCP.   Factors that may increase risk of adverse event in presence of aspiration Noe & Lianne 2021): Factors that may increase risk of adverse event in presence of aspiration Noe & Lianne 2021): Limited mobility   Recommendations/Plan: Swallowing Evaluation Recommendations Swallowing Evaluation Recommendations Recommendations: PO diet PO Diet Recommendation: Regular; Dysphagia 3 (Mechanical soft); Thin liquids (Level 0) Liquid Administration via: Cup; No straw Medication Administration: Whole meds with liquid Supervision: Patient able to self-feed; Full supervision/cueing for swallowing strategies Swallowing strategies  : Slow rate; Small bites/sips; Multiple dry swallows after each bite/sip; Chin tuck; Clear throat intermittently Postural changes: Position pt fully upright for meals; Stay upright 30-60 min after meals; Out of bed for meals Oral care recommendations: Oral care BID (2x/day)    Treatment Plan Treatment Plan Treatment recommendations: Defer treatment plan to SLP at other venue (see follow-up recommendations) Follow-up recommendations: Skilled nursing-short term rehab (<3 hours/day) Functional status assessment: Patient has had a recent decline in their functional status and demonstrates the ability to make significant improvements in function in a reasonable and predictable amount of time.     Recommendations Recommendations for follow up therapy are one component of a multi-disciplinary  discharge planning process, led by the attending physician.  Recommendations may be  updated based on patient status, additional functional criteria and insurance authorization.  Assessment: Orofacial Exam: Orofacial Exam Oral Cavity: Oral Hygiene: WFL; Xerostomia Oral Cavity - Dentition: Adequate natural dentition; Missing dentition Orofacial Anatomy: WFL Oral Motor/Sensory Function: WFL    Anatomy:  Anatomy: Presence of cervical hardware; WFL   Boluses Administered: Boluses Administered Boluses Administered: Thin liquids (Level 0); Mildly thick liquids (Level 2, nectar thick); Puree; Solid     Oral Impairment Domain: Oral Impairment Domain Lip Closure: Escape progressing to mid-chin (of cracker) Tongue control during bolus hold: Cohesive bolus between tongue to palatal seal Bolus preparation/mastication: Slow prolonged chewing/mashing with complete recollection Bolus transport/lingual motion: Delayed initiation of tongue motion (oral holding) Oral residue: Residue collection on oral structures Location of oral residue : Tongue Initiation of pharyngeal swallow : Pyriform sinuses     Pharyngeal Impairment Domain: Pharyngeal Impairment Domain Soft palate elevation: No bolus between soft palate (SP)/pharyngeal wall (PW) Laryngeal elevation: Complete superior movement of thyroid  cartilage with complete approximation of arytenoids to epiglottic petiole Anterior hyoid excursion: Complete anterior movement Epiglottic movement: Complete inversion Laryngeal vestibule closure: Incomplete, narrow column air/contrast in laryngeal vestibule Pharyngeal stripping wave : Present - complete Pharyngeal contraction (A/P view only): N/A Pharyngoesophageal segment opening: Complete distension and complete duration, no obstruction of flow Tongue base retraction: Narrow column of contrast or air between tongue base and PPW Pharyngeal residue: Collection of residue within or on pharyngeal structures Location of pharyngeal residue: Tongue base; Valleculae; Pyriform sinuses;  Diffuse (>3 areas)     Esophageal Impairment Domain: Esophageal Impairment Domain Esophageal clearance upright position: Complete clearance, esophageal coating    Pill: Pill Consistency administered: Thin liquids (Level 0) Thin liquids (Level 0): Impaired (see clinical impressions)    Penetration/Aspiration Scale Score: Penetration/Aspiration Scale Score 1.  Material does not enter airway: Puree; Solid; Pill 6.  Material enters airway, passes BELOW cords then ejected out: Thin liquids (Level 0); Mildly thick liquids (Level 2, nectar thick)    Compensatory Strategies: Compensatory Strategies Compensatory strategies: Yes Straw: Ineffective Ineffective Straw: Thin liquid (Level 0) Multiple swallows: Effective (but difficult for her to complete timely secondary swallows) Effective Multiple Swallows: Thin liquid (Level 0); Mildly thick liquid (Level 2, nectar thick) Chin tuck: Effective Effective Chin Tuck: Thin liquid (Level 0)       General Information: Caregiver present: Yes   Diet Prior to this Study: Regular; Thin liquids (Level 0)    Temperature : Normal    Respiratory Status: WFL    Supplemental O2: None (Room air)    History of Recent Intubation: No   Behavior/Cognition: Alert; Cooperative; Pleasant mood  Self-Feeding Abilities: Able to self-feed  Baseline vocal quality/speech: Normal  Volitional Cough: Able to elicit  Volitional Swallow: Able to elicit  Exam Limitations: No limitations  Pain: Pain Assessment Pain Assessment: No/denies pain  SLP visit diagnosis: SLP Visit Diagnosis: Dysphagia, oropharyngeal phase (R13.12)   Past Medical History:  Past Medical History:  Diagnosis Date   Anxiety    COPD (chronic obstructive pulmonary disease) (HCC)    Depression    GERD (gastroesophageal reflux disease)    Hypertension    Influenza    Osteoarthritis    Stroke [facility])    Past Surgical History:  Past Surgical History:  Procedure  Laterality Date   ANTERIOR CERVICAL DISCECTOMY     BREAST BIOPSY Left    CATARACT EXTRACTION W/ INTRAOCULAR LENS IMPLANT & ANTERIOR VITRECTOMY, BILATERAL  Bilateral    ESOPHAGOGASTRODUODENOSCOPY N/A 05/14/2024   Procedure: EGD (ESOPHAGOGASTRODUODENOSCOPY);  Surgeon: Cinderella Deatrice FALCON, MD;  [facility];  Service: Endoscopy;  Laterality: N/A;  11:00AM;ASA 3   FEMUR FRACTURE SURGERY Bilateral    ORIF TIBIA FRACTURE Left    Thank you,  Lamar Candy, CCC-SLP [phone]  Shay Jhaveri 09/16/2024, 1:22 PM

## 2024-09-16 NOTE — Nursing Note (Signed)
 Resident was seen on 09/11/24 in facility by [facility] podiatrist.

## 2024-09-16 NOTE — Nursing Note (Signed)
 LOA TO APPT AT [facility] FOR MBS VIA SAFE HANDS TRANSPORTATION.

## 2024-10-28 ENCOUNTER — Encounter (INDEPENDENT_AMBULATORY_CARE_PROVIDER_SITE_OTHER): Payer: Self-pay | Admitting: Gastroenterology

## 2024-11-26 NOTE — Group Note (Signed)
 LTC Activity Therapy Note <redacted file path>  Number of Participants: 10 Group Date: 11/26/2024 Start Time: 1100 End Time: 1230 Facilitators: Robbert Rosaline LABOR, CNA   Christmas tree craft Patient Name: Celestia Duva MRN: [id number] Today's Date: 11/26/2024            I attest that I have reviewed the above information. Signed: Rosaline LABOR Robbert, CNA Filed 11/26/2024

## 2024-11-28 NOTE — LTC Provider Review (Signed)
   OCCUPATIONAL THERAPY TREATMENT NOTE   Patient Name:  Judith Hunt      Medical Record Number: [id number]  Date of Birth: [birth year] Location: [facility] [place]   PT Precautions: Falls,   OT Precautions: Falls,   SLP Precautions: Aspiration Precautions,    Weight Bearing: RUE Weight Bearing: No Restrictions   LUE Weight Bearing: No Restrictions   RLE Weight Bearing: No Restrictions   LLE Weight Bearing: No Restrictions    SUBJECTIVE Pt tolerated session well, no complaints of pain. Pt would benefit from further skilled OT services.    OBJECTIVE Today's Treatment   BUE strengthening exercise completed with monitoring of pt's response, graded resistance accordingly and provided rest breaks when needed due to fatigue in order to increase pt's activity tolerance, (I) with ADLS and BUE strength needed for UE support during sit to stands, use of handrails and RW when engaging in ADL tasks.  Pt engaged in therapeutic activities to increase sitting balance and trunk strength, functional reach, bi-lateral integration, trunk control, proximal stability and general activity tolerance, ROM, bending, gross motor coordination to improve with ADLs while seated unsupported. Pt required frequent RB's due to fatigue.   I attest that I have reviewed the above information. Signed: Tessarah Dalleave, OTA 11/28/2024

## 2024-11-29 NOTE — Nursing Note (Addendum)
 REMAINS ON THERAPY CASELOAD.

## 2024-11-30 NOTE — Nursing Note (Signed)
 Pt alert and responsive.Pt able to make needs known.Skin warm and dry.Resp even and non labored.Pt remains on therapy caseload.Pt resting in bed this shift.Call bell and hydration within reach.No noted distress.

## 2024-12-07 ENCOUNTER — Ambulatory Visit (INDEPENDENT_AMBULATORY_CARE_PROVIDER_SITE_OTHER): Admitting: Gastroenterology

## 2025-01-19 ENCOUNTER — Ambulatory Visit (INDEPENDENT_AMBULATORY_CARE_PROVIDER_SITE_OTHER): Admitting: Gastroenterology
